# Patient Record
Sex: Female | Born: 1940 | Race: White | Hispanic: No | Smoking: Former smoker
Health system: Southern US, Community
[De-identification: ages and names within clinical notes are randomized; demographics above are authoritative.]

## PROBLEM LIST (undated history)

## (undated) DIAGNOSIS — M1990 Unspecified osteoarthritis, unspecified site: Secondary | ICD-10-CM

## (undated) DIAGNOSIS — Z889 Allergy status to unspecified drugs, medicaments and biological substances status: Secondary | ICD-10-CM

## (undated) DIAGNOSIS — E039 Hypothyroidism, unspecified: Secondary | ICD-10-CM

## (undated) DIAGNOSIS — R51 Headache: Secondary | ICD-10-CM

## (undated) DIAGNOSIS — A310 Pulmonary mycobacterial infection: Secondary | ICD-10-CM

## (undated) DIAGNOSIS — R0600 Dyspnea, unspecified: Secondary | ICD-10-CM

## (undated) DIAGNOSIS — D649 Anemia, unspecified: Secondary | ICD-10-CM

## (undated) DIAGNOSIS — Z972 Presence of dental prosthetic device (complete) (partial): Secondary | ICD-10-CM

## (undated) DIAGNOSIS — J189 Pneumonia, unspecified organism: Secondary | ICD-10-CM

## (undated) HISTORY — DX: Pulmonary mycobacterial infection: A31.0

## (undated) HISTORY — PX: TONSILLECTOMY: SUR1361

## (undated) HISTORY — PX: ABDOMINAL HYSTERECTOMY: SHX81

## (undated) HISTORY — PX: CATARACT EXTRACTION, BILATERAL: SHX1313

## (undated) HISTORY — DX: Hypothyroidism, unspecified: E03.9

## (undated) HISTORY — DX: Allergy status to unspecified drugs, medicaments and biological substances: Z88.9

---

## 2003-10-24 ENCOUNTER — Encounter: Admission: RE | Admit: 2003-10-24 | Discharge: 2003-10-24 | Payer: Self-pay | Admitting: Pediatrics

## 2004-01-08 ENCOUNTER — Ambulatory Visit: Admission: RE | Admit: 2004-01-08 | Discharge: 2004-01-08 | Payer: Self-pay | Admitting: Critical Care Medicine

## 2004-01-08 ENCOUNTER — Encounter (INDEPENDENT_AMBULATORY_CARE_PROVIDER_SITE_OTHER): Payer: Self-pay | Admitting: *Deleted

## 2004-01-30 ENCOUNTER — Ambulatory Visit (HOSPITAL_COMMUNITY): Admission: RE | Admit: 2004-01-30 | Discharge: 2004-01-30 | Payer: Self-pay | Admitting: Gastroenterology

## 2004-06-23 ENCOUNTER — Ambulatory Visit: Payer: Self-pay | Admitting: Critical Care Medicine

## 2004-06-24 ENCOUNTER — Ambulatory Visit: Payer: Self-pay | Admitting: Hematology & Oncology

## 2004-07-29 ENCOUNTER — Ambulatory Visit: Payer: Self-pay | Admitting: Critical Care Medicine

## 2004-07-29 ENCOUNTER — Inpatient Hospital Stay (HOSPITAL_COMMUNITY): Admission: EM | Admit: 2004-07-29 | Discharge: 2004-08-05 | Payer: Self-pay | Admitting: Emergency Medicine

## 2004-08-03 ENCOUNTER — Ambulatory Visit: Payer: Self-pay | Admitting: Hematology & Oncology

## 2004-08-08 ENCOUNTER — Ambulatory Visit: Payer: Self-pay | Admitting: Critical Care Medicine

## 2004-08-21 ENCOUNTER — Ambulatory Visit: Payer: Self-pay | Admitting: Critical Care Medicine

## 2004-08-22 ENCOUNTER — Ambulatory Visit: Payer: Self-pay | Admitting: Endocrinology

## 2004-08-22 ENCOUNTER — Encounter: Admission: RE | Admit: 2004-08-22 | Discharge: 2004-08-22 | Payer: Self-pay | Admitting: Critical Care Medicine

## 2004-08-27 ENCOUNTER — Ambulatory Visit: Payer: Self-pay | Admitting: Endocrinology

## 2004-09-16 ENCOUNTER — Ambulatory Visit: Payer: Self-pay | Admitting: Critical Care Medicine

## 2004-09-17 ENCOUNTER — Ambulatory Visit: Payer: Self-pay | Admitting: Endocrinology

## 2004-09-29 ENCOUNTER — Ambulatory Visit: Payer: Self-pay | Admitting: Critical Care Medicine

## 2004-11-25 ENCOUNTER — Ambulatory Visit: Payer: Self-pay | Admitting: Critical Care Medicine

## 2004-11-25 ENCOUNTER — Ambulatory Visit (HOSPITAL_COMMUNITY): Admission: RE | Admit: 2004-11-25 | Discharge: 2004-11-25 | Payer: Self-pay | Admitting: Critical Care Medicine

## 2005-01-13 ENCOUNTER — Ambulatory Visit: Payer: Self-pay | Admitting: Critical Care Medicine

## 2005-03-19 ENCOUNTER — Ambulatory Visit: Payer: Self-pay | Admitting: Critical Care Medicine

## 2005-04-23 ENCOUNTER — Ambulatory Visit: Payer: Self-pay | Admitting: Critical Care Medicine

## 2005-05-12 ENCOUNTER — Ambulatory Visit: Payer: Self-pay | Admitting: Critical Care Medicine

## 2005-07-01 ENCOUNTER — Ambulatory Visit: Payer: Self-pay | Admitting: Critical Care Medicine

## 2005-08-05 ENCOUNTER — Ambulatory Visit: Payer: Self-pay | Admitting: Critical Care Medicine

## 2005-10-08 ENCOUNTER — Ambulatory Visit: Payer: Self-pay | Admitting: Critical Care Medicine

## 2005-12-16 ENCOUNTER — Ambulatory Visit: Payer: Self-pay | Admitting: Critical Care Medicine

## 2006-02-16 ENCOUNTER — Ambulatory Visit: Payer: Self-pay | Admitting: Psychology

## 2006-03-05 ENCOUNTER — Ambulatory Visit: Payer: Self-pay | Admitting: Psychology

## 2006-04-10 ENCOUNTER — Emergency Department (HOSPITAL_COMMUNITY): Admission: EM | Admit: 2006-04-10 | Discharge: 2006-04-10 | Payer: Self-pay | Admitting: Family Medicine

## 2006-05-19 ENCOUNTER — Ambulatory Visit: Payer: Self-pay | Admitting: Internal Medicine

## 2006-05-20 ENCOUNTER — Ambulatory Visit: Payer: Self-pay | Admitting: Cardiology

## 2006-05-24 ENCOUNTER — Ambulatory Visit: Payer: Self-pay | Admitting: Internal Medicine

## 2007-04-20 ENCOUNTER — Ambulatory Visit: Payer: Self-pay | Admitting: Critical Care Medicine

## 2007-04-26 ENCOUNTER — Telehealth (INDEPENDENT_AMBULATORY_CARE_PROVIDER_SITE_OTHER): Payer: Self-pay | Admitting: *Deleted

## 2007-08-19 DIAGNOSIS — Z9109 Other allergy status, other than to drugs and biological substances: Secondary | ICD-10-CM | POA: Insufficient documentation

## 2007-08-19 DIAGNOSIS — J479 Bronchiectasis, uncomplicated: Secondary | ICD-10-CM

## 2007-08-19 DIAGNOSIS — E039 Hypothyroidism, unspecified: Secondary | ICD-10-CM | POA: Insufficient documentation

## 2008-02-20 ENCOUNTER — Telehealth: Payer: Self-pay | Admitting: Critical Care Medicine

## 2008-02-20 ENCOUNTER — Ambulatory Visit: Payer: Self-pay | Admitting: Critical Care Medicine

## 2008-02-20 ENCOUNTER — Telehealth (INDEPENDENT_AMBULATORY_CARE_PROVIDER_SITE_OTHER): Payer: Self-pay | Admitting: *Deleted

## 2008-02-21 ENCOUNTER — Encounter: Payer: Self-pay | Admitting: Critical Care Medicine

## 2008-02-22 ENCOUNTER — Telehealth: Payer: Self-pay | Admitting: Critical Care Medicine

## 2008-02-22 ENCOUNTER — Encounter: Payer: Self-pay | Admitting: Critical Care Medicine

## 2008-03-01 ENCOUNTER — Telehealth: Payer: Self-pay | Admitting: Critical Care Medicine

## 2008-03-08 ENCOUNTER — Telehealth (INDEPENDENT_AMBULATORY_CARE_PROVIDER_SITE_OTHER): Payer: Self-pay | Admitting: *Deleted

## 2008-03-14 ENCOUNTER — Telehealth (INDEPENDENT_AMBULATORY_CARE_PROVIDER_SITE_OTHER): Payer: Self-pay | Admitting: *Deleted

## 2008-03-15 ENCOUNTER — Telehealth (INDEPENDENT_AMBULATORY_CARE_PROVIDER_SITE_OTHER): Payer: Self-pay | Admitting: *Deleted

## 2008-03-16 ENCOUNTER — Ambulatory Visit: Payer: Self-pay | Admitting: Internal Medicine

## 2008-03-16 ENCOUNTER — Telehealth (INDEPENDENT_AMBULATORY_CARE_PROVIDER_SITE_OTHER): Payer: Self-pay | Admitting: *Deleted

## 2008-03-20 ENCOUNTER — Telehealth (INDEPENDENT_AMBULATORY_CARE_PROVIDER_SITE_OTHER): Payer: Self-pay | Admitting: *Deleted

## 2008-03-27 ENCOUNTER — Telehealth: Payer: Self-pay | Admitting: Critical Care Medicine

## 2008-09-24 ENCOUNTER — Ambulatory Visit: Payer: Self-pay | Admitting: Critical Care Medicine

## 2008-10-01 ENCOUNTER — Encounter: Payer: Self-pay | Admitting: Critical Care Medicine

## 2008-10-01 ENCOUNTER — Telehealth: Payer: Self-pay | Admitting: Critical Care Medicine

## 2008-10-03 ENCOUNTER — Telehealth: Payer: Self-pay | Admitting: Critical Care Medicine

## 2008-10-04 ENCOUNTER — Inpatient Hospital Stay (HOSPITAL_COMMUNITY): Admission: AD | Admit: 2008-10-04 | Discharge: 2008-10-12 | Payer: Self-pay | Admitting: Pulmonary Disease

## 2008-10-04 ENCOUNTER — Ambulatory Visit: Payer: Self-pay | Admitting: Internal Medicine

## 2008-10-04 ENCOUNTER — Encounter: Payer: Self-pay | Admitting: Critical Care Medicine

## 2008-10-04 ENCOUNTER — Telehealth: Payer: Self-pay | Admitting: Critical Care Medicine

## 2008-10-10 ENCOUNTER — Telehealth (INDEPENDENT_AMBULATORY_CARE_PROVIDER_SITE_OTHER): Payer: Self-pay | Admitting: *Deleted

## 2008-10-15 ENCOUNTER — Encounter: Payer: Self-pay | Admitting: Critical Care Medicine

## 2008-10-16 ENCOUNTER — Telehealth (INDEPENDENT_AMBULATORY_CARE_PROVIDER_SITE_OTHER): Payer: Self-pay | Admitting: *Deleted

## 2008-10-17 ENCOUNTER — Telehealth: Payer: Self-pay | Admitting: Critical Care Medicine

## 2008-10-19 ENCOUNTER — Telehealth (INDEPENDENT_AMBULATORY_CARE_PROVIDER_SITE_OTHER): Payer: Self-pay | Admitting: *Deleted

## 2008-10-30 ENCOUNTER — Ambulatory Visit: Payer: Self-pay | Admitting: Critical Care Medicine

## 2008-10-30 DIAGNOSIS — A310 Pulmonary mycobacterial infection: Secondary | ICD-10-CM

## 2008-10-31 ENCOUNTER — Encounter: Payer: Self-pay | Admitting: Critical Care Medicine

## 2008-11-01 ENCOUNTER — Encounter: Payer: Self-pay | Admitting: Critical Care Medicine

## 2008-11-01 ENCOUNTER — Telehealth: Payer: Self-pay | Admitting: Critical Care Medicine

## 2008-11-01 LAB — CONVERTED CEMR LAB
Basophils Relative: 0.8 % (ref 0.0–3.0)
Chloride: 103 meq/L (ref 96–112)
Eosinophils Absolute: 0.9 10*3/uL — ABNORMAL HIGH (ref 0.0–0.7)
Glucose, Bld: 85 mg/dL (ref 70–99)
Hemoglobin: 13.5 g/dL (ref 12.0–15.0)
IgA: 390 mg/dL — ABNORMAL HIGH (ref 68–378)
IgM, Serum: 869 mg/dL — ABNORMAL HIGH (ref 60–263)
Monocytes Absolute: 0.5 10*3/uL (ref 0.1–1.0)
Monocytes Relative: 6.2 % (ref 3.0–12.0)
Neutro Abs: 3.6 10*3/uL (ref 1.4–7.7)
Neutrophils Relative %: 49.9 % (ref 43.0–77.0)
Platelets: 367 10*3/uL (ref 150.0–400.0)
Potassium: 4.5 meq/L (ref 3.5–5.1)
RBC: 4.33 M/uL (ref 3.87–5.11)
Sodium: 139 meq/L (ref 135–145)
TSH: 3.35 microintl units/mL (ref 0.35–5.50)
WBC: 7.4 10*3/uL (ref 4.5–10.5)

## 2008-11-13 ENCOUNTER — Encounter: Payer: Self-pay | Admitting: Critical Care Medicine

## 2008-11-14 ENCOUNTER — Telehealth: Payer: Self-pay | Admitting: Critical Care Medicine

## 2008-11-19 ENCOUNTER — Telehealth: Payer: Self-pay | Admitting: Critical Care Medicine

## 2008-11-19 ENCOUNTER — Encounter: Payer: Self-pay | Admitting: Critical Care Medicine

## 2008-11-21 ENCOUNTER — Telehealth: Payer: Self-pay | Admitting: Critical Care Medicine

## 2008-11-28 ENCOUNTER — Ambulatory Visit: Payer: Self-pay | Admitting: Critical Care Medicine

## 2008-11-30 ENCOUNTER — Telehealth: Payer: Self-pay | Admitting: Critical Care Medicine

## 2008-12-07 ENCOUNTER — Telehealth (INDEPENDENT_AMBULATORY_CARE_PROVIDER_SITE_OTHER): Payer: Self-pay | Admitting: *Deleted

## 2008-12-11 ENCOUNTER — Telehealth (INDEPENDENT_AMBULATORY_CARE_PROVIDER_SITE_OTHER): Payer: Self-pay | Admitting: *Deleted

## 2008-12-28 ENCOUNTER — Telehealth: Payer: Self-pay | Admitting: Critical Care Medicine

## 2008-12-31 ENCOUNTER — Ambulatory Visit: Payer: Self-pay | Admitting: Critical Care Medicine

## 2009-01-01 ENCOUNTER — Encounter: Payer: Self-pay | Admitting: Critical Care Medicine

## 2009-01-08 ENCOUNTER — Encounter: Payer: Self-pay | Admitting: Critical Care Medicine

## 2009-01-21 ENCOUNTER — Encounter: Payer: Self-pay | Admitting: Critical Care Medicine

## 2009-03-04 ENCOUNTER — Ambulatory Visit: Payer: Self-pay | Admitting: Critical Care Medicine

## 2009-03-05 ENCOUNTER — Telehealth: Payer: Self-pay | Admitting: Critical Care Medicine

## 2009-03-12 ENCOUNTER — Encounter: Payer: Self-pay | Admitting: Critical Care Medicine

## 2009-03-20 ENCOUNTER — Encounter: Payer: Self-pay | Admitting: Critical Care Medicine

## 2009-05-13 ENCOUNTER — Ambulatory Visit: Payer: Self-pay | Admitting: Critical Care Medicine

## 2009-05-15 ENCOUNTER — Encounter: Payer: Self-pay | Admitting: Critical Care Medicine

## 2009-05-17 ENCOUNTER — Telehealth (INDEPENDENT_AMBULATORY_CARE_PROVIDER_SITE_OTHER): Payer: Self-pay | Admitting: *Deleted

## 2009-05-20 ENCOUNTER — Ambulatory Visit: Payer: Self-pay | Admitting: Critical Care Medicine

## 2009-05-20 ENCOUNTER — Telehealth (INDEPENDENT_AMBULATORY_CARE_PROVIDER_SITE_OTHER): Payer: Self-pay | Admitting: *Deleted

## 2009-05-27 ENCOUNTER — Telehealth: Payer: Self-pay | Admitting: Critical Care Medicine

## 2009-07-18 ENCOUNTER — Telehealth (INDEPENDENT_AMBULATORY_CARE_PROVIDER_SITE_OTHER): Payer: Self-pay | Admitting: *Deleted

## 2009-10-28 ENCOUNTER — Ambulatory Visit: Payer: Self-pay | Admitting: Critical Care Medicine

## 2009-11-10 ENCOUNTER — Emergency Department (HOSPITAL_COMMUNITY): Admission: EM | Admit: 2009-11-10 | Discharge: 2009-11-10 | Payer: Self-pay | Admitting: Family Medicine

## 2009-11-13 ENCOUNTER — Telehealth: Payer: Self-pay | Admitting: Pulmonary Disease

## 2010-01-16 ENCOUNTER — Encounter: Payer: Self-pay | Admitting: Critical Care Medicine

## 2010-01-17 ENCOUNTER — Encounter: Payer: Self-pay | Admitting: Critical Care Medicine

## 2010-03-11 ENCOUNTER — Ambulatory Visit: Payer: Self-pay | Admitting: Critical Care Medicine

## 2010-03-12 ENCOUNTER — Telehealth: Payer: Self-pay | Admitting: Critical Care Medicine

## 2010-03-12 LAB — CONVERTED CEMR LAB
ALT: 13 units/L (ref 0–35)
AST: 26 units/L (ref 0–37)
Albumin: 3.3 g/dL — ABNORMAL LOW (ref 3.5–5.2)
Alkaline Phosphatase: 74 units/L (ref 39–117)
Basophils Absolute: 0.1 10*3/uL (ref 0.0–0.1)
CO2: 36 meq/L — ABNORMAL HIGH (ref 19–32)
Calcium: 9.2 mg/dL (ref 8.4–10.5)
Chloride: 97 meq/L (ref 96–112)
Creatinine, Ser: 1.1 mg/dL (ref 0.4–1.2)
GFR calc non Af Amer: 51.4 mL/min (ref >60)
Hemoglobin: 12.6 g/dL (ref 12.0–15.0)
Lymphs Abs: 2.6 10*3/uL (ref 0.7–4.0)
MCHC: 34.2 g/dL (ref 30.0–36.0)
Monocytes Absolute: 0.7 10*3/uL (ref 0.1–1.0)
Monocytes Relative: 6.8 % (ref 3.0–12.0)
Neutro Abs: 6.2 10*3/uL (ref 1.4–7.7)
Neutrophils Relative %: 59.1 % (ref 43.0–77.0)
Potassium: 3.7 meq/L (ref 3.5–5.1)
Total Bilirubin: 0.2 mg/dL — ABNORMAL LOW (ref 0.3–1.2)
WBC: 10.5 10*3/uL (ref 4.5–10.5)

## 2010-03-14 ENCOUNTER — Telehealth: Payer: Self-pay | Admitting: Critical Care Medicine

## 2010-04-09 ENCOUNTER — Ambulatory Visit: Payer: Self-pay | Admitting: Critical Care Medicine

## 2010-06-29 ENCOUNTER — Encounter: Payer: Self-pay | Admitting: Allergy and Immunology

## 2010-07-10 NOTE — Miscellaneous (Signed)
Summary: Hydromet Rx  Clinical Lists Changes  Medications: Added new medication of HYDROMET 5-1.5 MG/5ML SYRP (HYDROCODONE-HOMATROPINE) take one teasopponful by mouth every 4 to 6 hour as needed cough - Signed Rx of HYDROMET 5-1.5 MG/5ML SYRP (HYDROCODONE-HOMATROPINE) take one teasopponful by mouth every 4 to 6 hour as needed cough;  #174mL x 7;  Signed;  Entered by: Gweneth Dimitri RN;  Authorized by: Storm Frisk MD;  Method used: Historical    Prescriptions: HYDROMET 5-1.5 MG/5ML SYRP (HYDROCODONE-HOMATROPINE) take one teasopponful by mouth every 4 to 6 hour as needed cough  #174mL x 7   Entered by:   Gweneth Dimitri RN   Authorized by:   Storm Frisk MD   Signed by:   Gweneth Dimitri RN on 01/17/2010   Method used:   Historical   RxID:   [id number]  Received refill request from [facility].  Request authorized by PW as stated above and faxed back to [facility] at [phone] Gweneth Dimitri RN  January 17, 2010 5:05 PM

## 2010-07-10 NOTE — Progress Notes (Signed)
Summary: talk to nurse  Phone Note Call from Patient Call back at Home Phone [phone]   Caller: Patient Call For: wright Reason for Call: Talk to Nurse Summary of Call: Saw PW on 5/23, was given samples of nasonex, noticed big red nodules on her tongue today, wants to know if this is a side effect of this med, pls advise. Initial call taken by: Darletta Moll,  November 13, 2009 4:21 PM  Follow-up for Phone Call        PW patient--Pt states she was given sample of Nasonex at last OV with PW and she finsihed sample and now has a sore thorat and redblisters onthe back of her tongue as well as her throat. She sttets she mentioned this to her pharmacists and they told her that nasonex can cause thrush, so pt thinks this may be what she has. She states no other change in meds.  Please advise. Carron Curie CMA  November 13, 2009 4:39 PM [facility]  Additional Follow-up for Phone Call Additional follow up Details #1::        probably is thrush.. needs nystatin suspension 400,000 International Units swish and swallow three times a day for 5 days.  QS, no fills. Additional Follow-up by: Barbaraann Share MD,  November 13, 2009 5:31 PM    Additional Follow-up for Phone Call Additional follow up Details #2::    -pt advised rx sent.Carron Curie CMA  November 13, 2009 5:36 PM   New/Updated Medications: * NYSTATIN 400,000 IU swish and swallow three times a day x 5 days Prescriptions: NYSTATIN 400,000 IU swish and swallow three times a day x 5 days  #1qs x 0   Entered by:   Carron Curie CMA   Authorized by:   Barbaraann Share MD   Signed by:   Carron Curie CMA on 11/13/2009   Method used:   Telephoned to ...       [facility] [phone]* (retail)       [address]       [place], [place]  [address]       Ph: [phone] or [phone]       Fax: [phone]   RxID:   [id number]

## 2010-07-10 NOTE — Progress Notes (Signed)
Summary: ? pna/fever  Phone Note Call from Patient   Caller: Patient Call For: Arietta Eisenstein Summary of Call: pt think she may have pnuemonia . she want to no if  the biaxin will be enough to take care of the problem. [facility] [phone] Initial call taken by: Rickard Patience,  March 14, 2010 4:58 PM  Follow-up for Phone Call        called and spoke with pt.  pt states she was just seen by PW on 03/11/2010 and given rx for Biaxin.  Pt states she took her temp today and it was 100.5. Pt worried she may now have pna.  Pt worried if the Biaxin will take care of her "infection."  I instructed to take the Biaxin as [name] prescribed and encouraged rest and fluids and to call the office or go to [facility] or ER if symptoms do not improve or worse.  Will forward message to PW as an FYI.  Arman Filter LPN  March 14, 2010 5:37 PM   Additional Follow-up for Phone Call Additional follow up Details #1::        noted and i agree Additional Follow-up by: Storm Frisk MD,  March 14, 2010 6:13 PM

## 2010-07-10 NOTE — Assessment & Plan Note (Signed)
Summary: Pulmonary OV   Primary Provider/Referring Provider:  none  CC:  Follow up.  Pt states Brooke [name] is having "a lot of SOB" x 1 month.  Coughing " all the time" - prod with yellow mucus.  .  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  Brooke [name] is now off anti-tuberculous therapy including off the Zithromax, ethambutol, and Rifabutin since 10/2005.    Oct 28, 2009 10:55 AM The pts cough has been bad this spring.   There is a ? of a head cold.  More sinus issues.  Notes more mucous.  The mucous is dark green. Notes more wheeze.  Has chronic pain in chest area.  If coughs will catch.  Has a croupy cough and throat feels like ripped.  The mucous is looser. Pt denies any significant sore throat, nasal congestion or excess secretions, fever, chills, sweats, unintended weight loss, pleurtic or exertional chest pain, orthopnea PND, or leg swelling Pt denies any increase in rescue therapy over baseline, denies waking up needing it or having any early am or nocturnal exacerbations of coughing/wheezing/or dyspnea.  March 11, 2010 2:40 PM one month of more coughing and more dyspnea.  Now is more hoarse.  Nose is draining.  Ill for a week and having emesis.  No heartburn.  Ears are stopped up.  Notes some chest pain if take a deep breath. Mucus is yellow.  Pain is same pain always has.    Preventive Screening-Counseling & Management  Alcohol-Tobacco     Smoking Status: quit > 6 months  Current Medications (verified): 1)  Alprazolam 0.5 Mg Tabs (Alprazolam) .... Take 1 Tablet By Mouth Three Times A Day As Needed 2)  Celexa 10 Mg Tabs (Citalopram Hydrobromide) .... 1/2  By Mouth Daily 3)  Cenestin 0.625 Mg  Tabs (Estrogens Conj Synthetic A) .... Once Daily 4)  Synthroid 100 Mcg Tabs  (Levothyroxine Sodium) .... One Half Daily 5)  Maxzide 75-50 Mg  Tabs (Triamterene-Hctz) .... One Half Once A  Daily If Needed For Swelling 6)  Hydrocodone-Acetaminophen 10-325 Mg Tabs (Hydrocodone-Acetaminophen) .... Every 4 Hours As Needed Pain 7)  Hydromet 5-1.5 Mg/52ml Syrp (Hydrocodone-Homatropine) .... Take One Teasopponful By Mouth Every 4 To 6 Hour As Needed Cough  Allergies (verified): 1)  ! Pcn 2)  ! Sulfa 3)  ! Levaquin 4)  ! * Nasonex  Past History:  Past medical, surgical, family and social histories (including risk factors) reviewed, and no changes noted (except as noted below).  Past Medical History: Reviewed history from 11/28/2008 and no changes required.  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 neg resp dz/atopy  Social History: Reviewed history from 03/16/2008 and no changes required. former smoker.  < 1 ppd x 3 yrs.  Quit at age 27.  Review of Systems       The patient complains of shortness of breath with activity, shortness of breath at rest, productive cough, non-productive cough, chest pain, and change in color of mucus.  The patient denies coughing up blood, irregular heartbeats, acid heartburn, indigestion, loss of appetite, weight change, abdominal pain, difficulty swallowing, sore throat,  tooth/dental problems, headaches, nasal congestion/difficulty breathing through nose, sneezing, itching, ear ache, anxiety, depression, hand/feet swelling, joint stiffness or pain, rash, and fever.    Vital Signs:  Patient profile:   70 year old female Height:      65 inches Weight:      113.13 pounds BMI:     18.89 O2 Sat:      97 % on Room air Temp:     98.0 degrees F oral Pulse rate:   73 / minute BP sitting:   98 / 56   (left arm) Cuff size:   regular  Vitals Entered By: Gweneth Dimitri RN (March 11, 2010 2:28 PM)  O2 Flow:  Room air CC: Follow up.  Pt states Brooke [name] is having "a lot of SOB" x 1 month.  Coughing " all the time" - prod with yellow mucus.   Comments Medications reviewed with patient Daytime contact number verified with patient. Gweneth Dimitri RN  March 11, 2010 2:28 PM    Physical Exam  Additional Exam:  Gen: cachectic WF , in no distress , depressed affect ENT: no lesions, no post nasal drip Neck: No JVD, no TMG, no carotid bruits Lungs: No use of accessory muscles, no dullness to percussion, scattered rhonchi Cardiovascular: RRR, heart sounds normal, no murmurs or gallops, no peripheral edema Abdomen: soft and non-tender, no HSM, BS normal Skin: mild redness at picc line site Musculoskeletal: No deformities, no cyanosis or clubbing Neuro: alert, non-focal     Impression & Recommendations:  Problem # 1:  BRONCHIECTASIS (ICD-494.0) Assessment Deteriorated  Bronchiectasis with MRSA colonization, mild flare and early sinusitis  plan: trial astepro d/c nasal steroid due to side effects 7days generic biaxin 500mg  two times a day  Medications Added to Medication List This Visit: 1)  Hydrocodone-acetaminophen 10-325 Mg Tabs (Hydrocodone-acetaminophen) .... Every 4 hours as needed pain 2)  Ventolin Hfa 108 (90 Base) Mcg/act Aers (Albuterol sulfate) .Marland Kitchen.. 1-2 puffs every 4-6 hours as needed 3)  Biaxin Xl 500 Mg Tb24 (Clarithromycin) .Marland Kitchen.. 1000 mg (2 tablets) daily 4)  Astepro 0.15 % Soln (Azelastine hcl) .... Two sprays each nostril daily 5)  Promethazine Hcl 25 Mg Tabs (Promethazine hcl) .... One by mouth  every 4 hours as needed for nausea  Complete Medication List: 1)  Alprazolam 0.5 Mg Tabs (Alprazolam) .... Take 1 tablet by mouth three times a day as needed 2)  Celexa 10 Mg Tabs (Citalopram hydrobromide) .... 1/2  by mouth daily 3)  Cenestin 0.625 Mg Tabs (Estrogens conj  synthetic a) .... Once daily 4)  Synthroid 100 Mcg Tabs (Levothyroxine sodium) .... One half daily 5)  Maxzide 75-50 Mg Tabs (Triamterene-hctz) .... One half once a  daily if needed for swelling 6)  Hydrocodone-acetaminophen 10-325 Mg Tabs (Hydrocodone-acetaminophen) .... Every 4 hours as needed pain 7)  Hydromet 5-1.5 Mg/67ml Syrp (Hydrocodone-homatropine) .... Take one teasopponful by mouth every 4 to 6 hour as needed cough 8)  Ventolin Hfa 108 (90 Base) Mcg/act Aers (Albuterol sulfate) .Marland Kitchen.. 1-2 puffs every 4-6 hours as needed 9)  Biaxin Xl 500 Mg Tb24 (Clarithromycin) .Marland Kitchen.. 1000 mg (2 tablets) daily 10)  Astepro 0.15 % Soln (Azelastine hcl) .... Two sprays each nostril daily 11)  Promethazine Hcl 25 Mg Tabs (Promethazine hcl) .... One by mouth  every 4 hours as needed for nausea  Other Orders: Est. Patient Level IV (30160) TLB-CBC Platelet - w/Differential (85025-CBCD) TLB-BMP (Basic Metabolic Panel-BMET) (80048-METABOL) TLB-Hepatic/Liver Function Pnl (80076-HEPATIC)  Patient Instructions: 1)  Biaxin XL two daily for 7days  2)  Refills on cough syrup and pain med given 3)  Inhaler refill given 4)  Trial Astepro two sprays each nostril daily 5)  Return 2 months Prescriptions: HYDROCODONE-ACETAMINOPHEN 10-325 MG TABS (HYDROCODONE-ACETAMINOPHEN) every 4 hours as needed pain  #60 x 4   Entered and Authorized by:   Storm Frisk MD   Signed by:   Storm Frisk MD on 03/11/2010   Method used:   Print then Give to Patient   RxID:   [id number] PROMETHAZINE HCL 25 MG TABS (PROMETHAZINE HCL) One by mouth  every 4 hours as needed for nausea  #20 x 0   Entered and Authorized by:   Storm Frisk MD   Signed by:   Storm Frisk MD on 03/11/2010   Method used:   Print then Give to Patient   RxID:   [id number] HYDROMET 5-1.5 MG/5ML SYRP (HYDROCODONE-HOMATROPINE) take one teasopponful by mouth every 4 to 6 hour as needed cough  #240 ML x 1   Entered and Authorized by:    Storm Frisk MD   Signed by:   Storm Frisk MD on 03/11/2010   Method used:   Print then Give to Patient   RxID:   [id number] HYDROCODONE-ACETAMINOPHEN 10-325 MG TABS (HYDROCODONE-ACETAMINOPHEN) every 4 hours as needed pain  #60 x 0   Entered and Authorized by:   Storm Frisk MD   Signed by:   Storm Frisk MD on 03/11/2010   Method used:   Print then Give to Patient   RxID:   [id number] BIAXIN XL 500 MG  TB24 (CLARITHROMYCIN) 1000 mg (2 tablets) daily  #14 x 0   Entered and Authorized by:   Storm Frisk MD   Signed by:   Storm Frisk MD on 03/11/2010   Method used:   Print then Give to Patient   RxID:   [id number] VENTOLIN HFA 108 (90 BASE) MCG/ACT  AERS (ALBUTEROL SULFATE) 1-2 puffs every 4-6 hours as needed  #1 x 6   Entered and Authorized by:   Storm Frisk MD   Signed by:   Storm Frisk MD on 03/11/2010   Method used:   Print then Give to Patient   RxID:   [id number]

## 2010-07-10 NOTE — Progress Notes (Signed)
Summary: Lab results  Phone Note Outgoing Call   Reason for Call: Discuss lab or test results Summary of Call: call pt and tell her all labs are ok Initial call taken by: Storm Frisk MD,  March 12, 2010 10:00 AM  Follow-up for Phone Call        Called, spoke with pt. She was informed per PW labs ok.  She verbalized understanding.  Follow-up by: Gweneth Dimitri RN,  March 12, 2010 1:40 PM

## 2010-07-10 NOTE — Assessment & Plan Note (Signed)
Summary: Pulmonary OV   Primary Silvano Garofano/Referring Million Maharaj:  none  CC:  Acute Visit.  "lots of" chest congestion, cough - prod at times with dark green mucus, chest tightness, and increased SOB.  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  She is now off anti-tuberculous therapy including off the Zithromax, ethambutol, and Rifabutin since 10/2005.    Oct 28, 2009 10:55 AM The pts cough has been bad this spring.   There is a ? of a head cold.  More sinus issues.  Notes more mucous.  The mucous is dark green. Notes more wheeze.  Has chronic pain in chest area.  If coughs will catch.  Has a croupy cough and throat feels like ripped.  The mucous is looser. Pt denies any significant sore throat, nasal congestion or excess secretions, fever, chills, sweats, unintended weight loss, pleurtic or exertional chest pain, orthopnea PND, or leg swelling Pt denies any increase in rescue therapy over baseline, denies waking up needing it or having any early am or nocturnal exacerbations of coughing/wheezing/or dyspnea.  March 11, 2010 2:40 PM one month of more coughing and more dyspnea.  Now is more hoarse.  Nose is draining.  Ill for a week and having emesis.  No heartburn.  Ears are stopped up.  Notes some chest pain if take a deep breath. Mucus is yellow.  Pain is same pain always has.    April 09, 2010 12:23 PM The pt now notes lots of congestion.  The ears are stopped up.  The mucus is green. Symptoms worse over several weeks. There is more dyspnea.  There is no heartburn but there is chest pain if the pt takes a deep breath.   Preventive Screening-Counseling & Management  Alcohol-Tobacco     Smoking Status: quit > 6 months  Current  Medications (verified): 1)  Alprazolam 0.5 Mg Tabs (Alprazolam) .... Take 1 Tablet By Mouth Three Times A Day As Needed 2)  Celexa 10 Mg Tabs (Citalopram Hydrobromide) .... 1/2  By Mouth Daily 3)  Cenestin 0.625 Mg  Tabs (Estrogens Conj Synthetic A) .... Once Daily 4)  Synthroid 100 Mcg Tabs (Levothyroxine Sodium) .... One Half Daily 5)  Maxzide 75-50 Mg  Tabs (Triamterene-Hctz) .... One Half Once A  Daily  For Swelling 6)  Hydrocodone-Acetaminophen 10-325 Mg Tabs (Hydrocodone-Acetaminophen) .... Every 4 Hours As Needed Pain 7)  Hydromet 5-1.5 Mg/71ml Syrp (Hydrocodone-Homatropine) .... Take One Teasopponful By Mouth Every 4 To 6 Hour As Needed Cough 8)  Ventolin Hfa 108 (90 Base) Mcg/act  Aers (Albuterol Sulfate) .Marland Kitchen.. 1-2 Puffs Every 4-6 Hours As Needed  Allergies (verified): 1)  ! Pcn 2)  ! Sulfa 3)  ! Levaquin 4)  ! * Nasonex  Past History:  Past medical, surgical, family and social histories (including risk factors) reviewed, and no changes noted (except as noted below).  Past Medical History: Reviewed history from 11/28/2008 and no changes required.  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 neg resp dz/atopy  Social History: Reviewed history from 03/11/2010 and no changes required. former smoker.  <  1 ppd x 3 yrs.  Quit at age 76.  Review of Systems       The patient complains of shortness of breath with activity, shortness of breath at rest, productive cough, non-productive cough, and change in color of mucus.  The patient denies coughing up blood, chest pain, irregular heartbeats, acid heartburn, indigestion, loss of appetite, weight change, abdominal pain, difficulty swallowing, sore throat, tooth/dental problems, headaches, nasal  congestion/difficulty breathing through nose, sneezing, itching, ear ache, anxiety, depression, hand/feet swelling, joint stiffness or pain, rash, and fever.    Vital Signs:  Patient profile:   70 year old female Height:      65 inches Weight:      113.13 pounds BMI:     18.89 O2 Sat:      94 % on Room air Temp:     98.1 degrees F oral Pulse rate:   96 / minute BP sitting:   118 / 80  (right arm) Cuff size:   regular  Vitals Entered By: Gweneth Dimitri RN (April 09, 2010 12:07 PM)  O2 Flow:  Room air CC: Acute Visit.  "lots of" chest congestion, cough - prod at times with dark green mucus, chest tightness, increased SOB.  Using ventolin more frequently. Comments Medications reviewed with patient Daytime contact number verified with patient. Gweneth Dimitri RN  April 09, 2010 12:10 PM    Physical Exam  Additional Exam:  Gen: cachectic WF , in no distress , depressed affect ENT: no lesions, no post nasal drip Neck: No JVD, no TMG, no carotid bruits Lungs: No use of accessory muscles, no dullness to percussion, scattered rhonchi Cardiovascular: RRR, heart sounds normal, no murmurs or gallops, no peripheral edema Abdomen: soft and non-tender, no HSM, BS normal Skin: mild redness at picc line site Musculoskeletal: No deformities, no cyanosis or clubbing Neuro: alert, non-focal     CXR  Procedure date:  04/09/2010  Findings:      IMPRESSION: 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  Impression & Recommendations:  Problem # 1:  BRONCHIECTASIS (ICD-494.0) Assessment Deteriorated  Bronchiectasis with mild flare and neg CXR today for acute process  plan: 7days biaxin 500mg  two times a day no other med changes  Medications Added to Medication List This Visit: 1)  Maxzide 75-50 Mg Tabs (Triamterene-hctz) .... One half once a  daily  for swelling 2)  Clarithromycin 500 Mg Tabs (Clarithromycin) .... One by mouth two times a  day  Complete Medication List: 1)  Alprazolam 0.5 Mg Tabs (Alprazolam) .... Take 1 tablet by mouth three times a day as needed 2)  Celexa 10 Mg Tabs (Citalopram hydrobromide) .... 1/2  by mouth daily 3)  Cenestin 0.625 Mg Tabs (Estrogens conj synthetic a) .... Once daily 4)  Synthroid 100 Mcg Tabs (Levothyroxine sodium) .... One half daily 5)  Maxzide 75-50 Mg Tabs (Triamterene-hctz) .... One half once a  daily  for swelling 6)  Hydrocodone-acetaminophen 10-325 Mg Tabs (Hydrocodone-acetaminophen) .... Every 4 hours as needed pain 7)  Hydromet 5-1.5 Mg/88ml Syrp (Hydrocodone-homatropine) .... Take one teasopponful by mouth every 4 to 6 hour as needed cough 8)  Ventolin Hfa 108 (90 Base) Mcg/act Aers (Albuterol sulfate) .Marland Kitchen.. 1-2 puffs every 4-6 hours as needed 9)  Clarithromycin 500 Mg Tabs (Clarithromycin) .... One by mouth two times a day  Other Orders: Est. Patient Level IV (99214) T-2 View CXR (71020TC)  Patient Instructions: 1)  Biaxin one twice a day for  7days (generic sent) 2)  No change in other medications 3)  Get your primary care MD to check your thyroid function 4)  Chest xray today, I will call with result 5)  Return 2 months Prescriptions: CLARITHROMYCIN 500 MG TABS (CLARITHROMYCIN) one by mouth two times a day  #14 x 0   Entered and Authorized by:   Storm Frisk MD   Signed by:   Storm Frisk MD on 04/09/2010   Method used:   Print then Give to Patient   RxID:   [id number]

## 2010-07-10 NOTE — Medication Information (Signed)
Summary: Hydromet / [facility]  Hydromet / [facility]   Imported By: Lennie Odor 01/21/2010 14:32:42  _____________________________________________________________________  External Attachment:    Type:   Image     Comment:   External Document

## 2010-07-10 NOTE — Progress Notes (Signed)
Summary: PT'S CONDITION  Phone Note Call from Patient Call back at [phone]   Caller: Daughter VICKY Call For: WRIGHT Summary of Call: HAVE QUESTIONS ABOUT PT'S CONDITION  Initial call taken by: Rickard Patience,  July 18, 2009 3:01 PM  Follow-up for Phone Call        Spoke with pt's daughter.  She states that she would like to speak to Dr Delford Field about pt having been dxed with MRSA.  She is concerned about mother and feels "she is holding something back".  Would like PW to call her at his conveinience.  Aware that he is out of the office until 07/22/09.   Follow-up by: Vernie Murders,  July 18, 2009 3:17 PM  Additional Follow-up for Phone Call Additional follow up Details #1::        Find out what she wants more specifically I will not have time to make this call until next week Additional Follow-up by: Storm Frisk MD,  July 19, 2009 11:09 AM    Additional Follow-up for Phone Call Additional follow up Details #2::    LMTCB Vernie Murders  July 19, 2009 11:13 AM   Additional Follow-up for Phone Call Additional follow up Details #3:: Details for Additional Follow-up Action Taken: Pls see my earlier request and do not send back to my box until the request if fulfilled  Spoke with pt's daughter.  She wants to know what sort of tx pt will receive for her MRSA. Also, there has been an outbreak of MRSA at her son's school and pt's daughter would like to know if she should keep herself and her children away from pt until she is treated.  Please advise thanks.sign  She is colonized with MRSA,  She does not need to be rx now.  We are following her. It would be reasonable to keep small children away from her for now.   Spoke with pt's daughter and made aware of the abover per PW.   pt to keep next OV. Additional Follow-up by: Storm Frisk MD,  July 22, 2009 1:26 PM

## 2010-07-10 NOTE — Assessment & Plan Note (Signed)
Summary: Pulmonary OV   Primary Provider/Referring Provider:  none  CC:  Followup.  Pt states that cough has been worse x 1 wk.  Cough is prod with yellow sputum.  Pt states that she hears "rattling in chest".  She c/o sore throat that she relates to her cough.  .  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  She is now off anti-tuberculous therapy including off the Zithromax, ethambutol, and Rifabutin since 10/2005.    Oct 28, 2009 10:55 AM The pts cough has been bad this spring.   There is a ? of a head cold.  More sinus issues.  Notes more mucous.  The mucous is dark green. Notes more wheeze.  Has chronic pain in chest area.  If coughs will catch.  Has a croupy cough and throat feels like ripped.  The mucous is looser. Pt denies any significant sore throat, nasal congestion or excess secretions, fever, chills, sweats, unintended weight loss, pleurtic or exertional chest pain, orthopnea PND, or leg swelling Pt denies any increase in rescue therapy over baseline, denies waking up needing it or having any early am or nocturnal exacerbations of coughing/wheezing/or dyspnea.   Preventive Screening-Counseling & Management  Alcohol-Tobacco     Smoking Status: quit > 6 months  Current Medications (verified): 1)  Alprazolam 0.5 Mg Tabs (Alprazolam) .... Take 1 Tablet By Mouth Three Times A Day As Needed 2)  Celexa 10 Mg Tabs (Citalopram Hydrobromide) .... 1/2  By Mouth Daily 3)  Cenestin 0.625 Mg  Tabs (Estrogens Conj Synthetic A) .... Once Daily 4)  Synthroid 100 Mcg Tabs (Levothyroxine Sodium) .... One Half Daily 5)  Proventil Hfa 108 (90 Base) Mcg/act Aers (Albuterol Sulfate) .... 2 Puffs Every 6 Hours As Needed 6)  Maxzide 75-50 Mg  Tabs (Triamterene-Hctz) .... One Half Once  A  Daily If Needed For Swelling 7)  Albuterol Sulfate (2.5 Mg/5ml) 0.083%  Nebu (Albuterol Sulfate) .... Four Times Daily or Every 6 Hours As Needed 8)  Hydrocodone-Acetaminophen 10-325 Mg Tabs (Hydrocodone-Acetaminophen) .... Every 4 Hours As Needed Pain  Allergies (verified): 1)  ! Pcn 2)  ! Sulfa 3)  ! Levaquin  Past History:  Past medical, surgical, family and social histories (including risk factors) reviewed, and no changes noted (except as noted below).  Past Medical History: Reviewed history from 11/28/2008 and no changes required.  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 neg resp dz/atopy  Social History: Reviewed history from 03/16/2008 and no changes required. former smoker  Review of Systems       The patient complains of shortness of breath with activity and productive cough.  The patient denies shortness of breath at rest, non-productive cough, coughing up blood, chest pain, irregular heartbeats, acid heartburn, indigestion, loss of appetite, weight change, abdominal pain, difficulty swallowing, sore throat, tooth/dental problems, headaches, nasal congestion/difficulty breathing through nose, sneezing, itching, ear ache, anxiety, depression, hand/feet swelling, joint stiffness or pain, rash, change in color of mucus, and fever.    Vital Signs:  Patient profile:   70 year old female Weight:      114 pounds O2  Sat:      95 % on Room air Temp:     98.2 degrees F oral Pulse rate:   82 / minute BP sitting:   118 / 60  (left arm)  Vitals Entered By: Vernie Murders (Oct 28, 2009 10:47 AM)  O2 Flow:  Room air  Physical Exam  Additional Exam:  Gen: cachectic WF , in no distress , depressed affect ENT: no lesions, no post nasal  drip Neck: No JVD, no TMG, no carotid bruits Lungs: No use of accessory muscles, no dullness to percussion, no rhonchi Cardiovascular: RRR, heart sounds normal, no murmurs or gallops, no peripheral edema Abdomen: soft and non-tender, no HSM, BS normal Skin: mild redness at picc line site Musculoskeletal: No deformities, no cyanosis or clubbing Neuro: alert, non-focal     Impression & Recommendations:  Problem # 1:  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 Every 4 hours as needed pain 2)  Clarithromycin 500 Mg Tabs (Clarithromycin) .... One by mouth two times a day 3)  Nasonex 50 Mcg/act Susp (Mometasone furoate) .... Two puffs each nostril daily  Complete Medication List: 1)  Alprazolam 0.5 Mg Tabs (Alprazolam) .... Take 1 tablet by mouth three times a day as needed 2)  Celexa 10 Mg Tabs (Citalopram hydrobromide) .... 1/2  by mouth daily 3)  Cenestin 0.625 Mg Tabs (Estrogens conj synthetic a) .... Once daily 4)  Synthroid 100 Mcg Tabs (Levothyroxine sodium) .... One half daily 5)  Maxzide 75-50 Mg Tabs (Triamterene-hctz) .... One half once a  daily if needed for swelling 6)  Hydrocodone-acetaminophen 10-325 Mg Tabs (Hydrocodone-acetaminophen) .... Every 4 hours as needed pain 7)  Clarithromycin 500 Mg Tabs (Clarithromycin) .... One by mouth two times a day 8)  Nasonex 50 Mcg/act Susp (Mometasone furoate) .... Two puffs each nostril daily  Other Orders: Est. Patient Level IV (04540) Prescription Created Electronically [phone])  Patient Instructions: 1)   Nasonex two puff  each nostril daily 2)  Hydrocodone for pain 3)  Biaxin (generic) one twice daily for 7 days 4)  Use saline nasal spray two sprays each nostril three times daily 5)  Ventolin sample given (same as proventil) 6)  Return 4 months, sooner if unimproved Prescriptions: NASONEX 50 MCG/ACT  SUSP (MOMETASONE FUROATE) Two puffs each nostril daily  #1 x 6   Entered and Authorized by:   Storm Frisk MD   Signed by:   Storm Frisk MD on 10/28/2009   Method used:   Print then Give to Patient   RxID:   [id number] HYDROCODONE-ACETAMINOPHEN 10-325 MG TABS (HYDROCODONE-ACETAMINOPHEN) every 4 hours as needed pain  #60 x 4   Entered and Authorized by:   Storm Frisk MD   Signed by:   Storm Frisk MD on 10/28/2009   Method used:   Print then Give to Patient   RxID:   [id number] CLARITHROMYCIN 500 MG TABS (CLARITHROMYCIN) one by mouth two times a day  #14 x 0   Entered and Authorized by:   Storm Frisk MD   Signed by:   Storm Frisk MD on 10/28/2009   Method used:  Print then Give to Patient   RxID:   [id number]

## 2010-07-15 ENCOUNTER — Ambulatory Visit: Payer: Self-pay | Admitting: Critical Care Medicine

## 2010-07-23 ENCOUNTER — Encounter: Payer: Self-pay | Admitting: Critical Care Medicine

## 2010-07-23 ENCOUNTER — Ambulatory Visit (INDEPENDENT_AMBULATORY_CARE_PROVIDER_SITE_OTHER): Payer: MEDICARE | Admitting: Critical Care Medicine

## 2010-07-23 DIAGNOSIS — J479 Bronchiectasis, uncomplicated: Secondary | ICD-10-CM

## 2010-07-23 DIAGNOSIS — A310 Pulmonary mycobacterial infection: Secondary | ICD-10-CM

## 2010-07-30 NOTE — Assessment & Plan Note (Signed)
Summary: Pulmonary OV   Primary Provider/Referring Provider:  none  CC:  Follow up.  sinus pressure/congestion x 1 wk.  prod cough with light green to yellowish mucus..  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  She is now off anti-tuberculous therapy including off the Zithromax, ethambutol, and Rifabutin since 10/2005.      April 09, 2010 12:23 PM The pt now notes lots of congestion.  The ears are stopped up.  The mucus is green. Symptoms worse over several weeks. There is more dyspnea.  There is no heartburn but there is chest pain if the pt takes a deep breath.   July 23, 2010 3:34 PM Notes chronic sinus drip,  and more cough that is constant.  Cough is harder and harder to raise mucus and feels congestion.  Pt notes hoarseness and scratchy throat. Noted sl fever one time.   This pt has noted more dry cough.  Her dyspnea is at baseline.  There is no chills or sweats.   Preventive Screening-Counseling & Management  Alcohol-Tobacco     Smoking Status: quit > 6 months  Current Medications (verified): 1)  Alprazolam 0.5 Mg Tabs (Alprazolam) .... Take 1 Tablet By Mouth Three Times A Day As Needed 2)  Celexa 10 Mg Tabs (Citalopram Hydrobromide) .... 1/2  By Mouth Daily 3)  Cenestin 0.625 Mg  Tabs (Estrogens Conj Synthetic A) .... Once Daily 4)  Synthroid 100 Mcg Tabs (Levothyroxine Sodium) .... One Half Daily 5)  Maxzide 75-50 Mg  Tabs (Triamterene-Hctz) .... One Half Once A  Daily  For Swelling 6)  Hydrocodone-Acetaminophen 10-325 Mg Tabs (Hydrocodone-Acetaminophen) .... Every 4 Hours As Needed Pain 7)  Hydromet 5-1.5 Mg/29ml Syrp (Hydrocodone-Homatropine) .... Take One Teasopponful By Mouth Every 4 To 6 Hour As Needed Cough 8)  Ventolin Hfa 108 (90 Base)  Mcg/act  Aers (Albuterol Sulfate) .Marland Kitchen.. 1-2 Puffs Every 4-6 Hours As Needed  Allergies: 1)  ! Sulfa 2)  ! Levaquin 3)  ! * Nasonex  Past History:  Past medical, surgical, family and social histories (including risk factors) reviewed, and no changes noted (except as noted below).  Past Medical History: Reviewed history from 11/28/2008 and no changes required.  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 neg resp dz/atopy  Social History: Reviewed history from 03/11/2010 and no changes required. former smoker.  < 1 ppd x 3 yrs.  Quit at age 46.  Review of Systems       The patient complains of shortness of breath with activity, productive cough, non-productive cough, and change in color of mucus.  The patient denies shortness of breath at rest, coughing up blood, chest pain, irregular heartbeats, acid heartburn, indigestion, loss of appetite, weight change, abdominal pain, difficulty swallowing, sore throat, tooth/dental problems, headaches, nasal congestion/difficulty breathing through nose, sneezing, itching, ear ache, anxiety, depression, hand/feet swelling, joint stiffness or pain, rash, and fever.    Vital Signs:  Patient profile:   70 year old female Height:      65 inches Weight:      112.13 pounds BMI:     18.73 O2 Sat:  96 % on Room air Temp:     98.3 degrees F oral Pulse rate:   79 / minute BP sitting:   90 / 62  (left arm) Cuff size:   regular  Vitals Entered By: Gweneth Dimitri RN (July 23, 2010 3:28 PM)  O2 Flow:  Room air CC: Follow up.  sinus pressure/congestion x 1 wk.  prod cough with light green to yellowish mucus. Comments Medications reviewed with patient Daytime contact number verified with patient. 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 Patient Level IV (16109)  No evidence for active MAC infection at this time  Problem # 2:  BRONCHIECTASIS (ICD-494.0) Assessment: Unchanged  Bronchiectasis with mild flare   plan: 7days biaxin 500mg  two times a day no other med changes  Medications Added to Medication List This Visit: 1)  Hydrocodone-acetaminophen 10-325 Mg Tabs (Hydrocodone-acetaminophen) .... Every 4 hours as needed pain 2)  Ventolin Hfa 108 (90 Base) Mcg/act Aers (Albuterol sulfate) .Marland Kitchen.. 1-2 puffs every 4-6 hours as needed 3)  Clarithromycin 500 Mg Tabs (Clarithromycin) .... One by mouth two times a day  Complete Medication List: 1)  Alprazolam 0.5 Mg Tabs (Alprazolam) .... Take 1 tablet by mouth three times a day as needed 2)  Celexa 10 Mg Tabs (Citalopram hydrobromide) .... 1/2  by mouth daily 3)  Cenestin 0.625 Mg Tabs (Estrogens conj synthetic a) .... Once daily 4)  Synthroid 100 Mcg Tabs (Levothyroxine sodium) .... One half daily 5)  Maxzide 75-50 Mg Tabs (Triamterene-hctz) .... One half once a  daily  for swelling 6)  Hydrocodone-acetaminophen 10-325 Mg Tabs (Hydrocodone-acetaminophen) .... Every 4 hours as needed pain 7)  Ventolin Hfa 108 (90 Base) Mcg/act Aers (Albuterol sulfate) .Marland Kitchen.. 1-2 puffs every 4-6 hours as needed 8)   Clarithromycin 500 Mg Tabs (Clarithromycin) .... One by mouth two times a day  Patient Instructions: 1)  Use simple saline nasal spray two sprays each nostril three times daily 2)  Clarythromycin one twice daily for 7days 3)  Ventolin and hydrocodone refills given 4)  No other medication changes 5)  Return 3 months Prescriptions: CLARITHROMYCIN 500 MG TABS (CLARITHROMYCIN) one by mouth two times a day  #14 x 0   Entered and Authorized by:   Storm Frisk MD   Signed by:   Storm Frisk MD on 07/23/2010   Method used:   Print then Give to Patient   RxID:   [id number] HYDROCODONE-ACETAMINOPHEN 10-325 MG TABS (HYDROCODONE-ACETAMINOPHEN) every 4 hours as needed pain  #90 x 4   Entered and Authorized by:   Storm Frisk MD   Signed by:   Storm Frisk MD on 07/23/2010   Method used:   Print then Give to Patient   RxID:   [id number] VENTOLIN HFA 108 (90 BASE) MCG/ACT  AERS (ALBUTEROL SULFATE) 1-2 puffs every 4-6 hours  as needed  #1 x 6   Entered and Authorized by:   Storm Frisk MD   Signed by:   Storm Frisk MD on 07/23/2010   Method used:   Print then Give to Patient   RxID:   [id number]

## 2010-09-16 LAB — BASIC METABOLIC PANEL
BUN: 5 mg/dL — ABNORMAL LOW (ref 6–23)
BUN: 5 mg/dL — ABNORMAL LOW (ref 6–23)
BUN: 5 mg/dL — ABNORMAL LOW (ref 6–23)
BUN: 6 mg/dL (ref 6–23)
CO2: 27 mEq/L (ref 19–32)
CO2: 28 mEq/L (ref 19–32)
CO2: 28 mEq/L (ref 19–32)
Calcium: 8 mg/dL — ABNORMAL LOW (ref 8.4–10.5)
Calcium: 8.5 mg/dL (ref 8.4–10.5)
Chloride: 101 mEq/L (ref 96–112)
Chloride: 104 mEq/L (ref 96–112)
Chloride: 105 mEq/L (ref 96–112)
Creatinine, Ser: 0.68 mg/dL (ref 0.4–1.2)
Creatinine, Ser: 0.69 mg/dL (ref 0.4–1.2)
Creatinine, Ser: 0.69 mg/dL (ref 0.4–1.2)
GFR calc Af Amer: >60 mL/min (ref >60)
GFR calc Af Amer: >60 mL/min (ref >60)
GFR calc Af Amer: >60 mL/min (ref >60)
GFR calc non Af Amer: >60 mL/min (ref >60)
GFR calc non Af Amer: >60 mL/min (ref >60)
GFR calc non Af Amer: >60 mL/min (ref >60)
Glucose, Bld: 77 mg/dL (ref 70–99)
Potassium: 3.7 mEq/L (ref 3.5–5.1)
Potassium: 3.9 mEq/L (ref 3.5–5.1)
Sodium: 138 mEq/L (ref 135–145)

## 2010-09-16 LAB — CBC
HCT: 32.8 % — ABNORMAL LOW (ref 36.0–46.0)
Hemoglobin: 9.5 g/dL — ABNORMAL LOW (ref 12.0–15.0)
MCHC: 33.4 g/dL (ref 30.0–36.0)
MCHC: 34 g/dL (ref 30.0–36.0)
MCV: 91.9 fL (ref 78.0–100.0)
MCV: 92.4 fL (ref 78.0–100.0)
Platelets: 298 10*3/uL (ref 150–400)
RBC: 3.05 MIL/uL — ABNORMAL LOW (ref 3.87–5.11)
RDW: 13.9 % (ref 11.5–15.5)
RDW: 13.9 % (ref 11.5–15.5)
WBC: 6 10*3/uL (ref 4.0–10.5)

## 2010-09-16 LAB — VANCOMYCIN, TROUGH
Vancomycin Tr: 12.5 ug/mL (ref 10.0–20.0)
Vancomycin Tr: 27.1 ug/mL (ref 10.0–20.0)

## 2010-09-16 LAB — EXPECTORATED SPUTUM ASSESSMENT W GRAM STAIN, RFLX TO RESP C

## 2010-09-16 LAB — CULTURE, RESPIRATORY W GRAM STAIN: Culture: NORMAL

## 2010-09-16 LAB — PHOSPHORUS: Phosphorus: 2.1 mg/dL — ABNORMAL LOW (ref 2.3–4.6)

## 2010-09-17 LAB — BASIC METABOLIC PANEL
BUN: 7 mg/dL (ref 6–23)
CO2: 31 mEq/L (ref 19–32)
Chloride: 92 mEq/L — ABNORMAL LOW (ref 96–112)
Chloride: 93 mEq/L — ABNORMAL LOW (ref 96–112)
GFR calc non Af Amer: 56 mL/min — ABNORMAL LOW (ref >60)
Glucose, Bld: 85 mg/dL (ref 70–99)
Glucose, Bld: 87 mg/dL (ref 70–99)
Potassium: 3.3 mEq/L — ABNORMAL LOW (ref 3.5–5.1)
Potassium: 3.4 mEq/L — ABNORMAL LOW (ref 3.5–5.1)
Sodium: 133 mEq/L — ABNORMAL LOW (ref 135–145)

## 2010-09-17 LAB — CARDIAC PANEL(CRET KIN+CKTOT+MB+TROPI)
CK, MB: 1.4 ng/mL (ref 0.3–4.0)
Troponin I: 0.01 ng/mL (ref 0.00–0.06)

## 2010-09-17 LAB — CBC
HCT: 32 % — ABNORMAL LOW (ref 36.0–46.0)
HCT: 32.8 % — ABNORMAL LOW (ref 36.0–46.0)
Hemoglobin: 10.8 g/dL — ABNORMAL LOW (ref 12.0–15.0)
MCV: 91.9 fL (ref 78.0–100.0)
Platelets: 306 10*3/uL (ref 150–400)
RBC: 3.6 MIL/uL — ABNORMAL LOW (ref 3.87–5.11)
WBC: 5.9 10*3/uL (ref 4.0–10.5)
WBC: 6.5 10*3/uL (ref 4.0–10.5)

## 2010-09-17 LAB — TSH: TSH: 2.769 u[IU]/mL (ref 0.350–4.500)

## 2010-09-22 ENCOUNTER — Telehealth: Payer: Self-pay | Admitting: Critical Care Medicine

## 2010-09-22 NOTE — Telephone Encounter (Signed)
This is ok

## 2010-09-22 NOTE — Telephone Encounter (Signed)
Called spoke with patient, who verified that her PCP retired unexpectedly.  Pt takes alprazolam 0.5mg , 1 tab tid prn.  Pt requests that [facility] refill this for her while she tries to find another PCP.  [name], please advise thanks.  [facility].    Allergies: sulfa.

## 2010-09-23 MED ORDER — ALPRAZOLAM 0.5 MG PO TABS: 0.5000 mg | ORAL_TABLET | Freq: Three times a day (TID) | ORAL | Status: DC | PRN

## 2010-09-23 NOTE — Telephone Encounter (Signed)
Patient notified we will refill her Alprazolam and RX called to [facility].

## 2010-10-13 ENCOUNTER — Telehealth: Payer: Self-pay | Admitting: Critical Care Medicine

## 2010-10-13 NOTE — Telephone Encounter (Signed)
Spoke w/ pt and she states she has bronchiectasis and MRSA. Pt states she has been coughing a lot of MRSA up and it is olive green. Pt states "she know's this is MRSA and know's what it looks like and she is not crazy". Pt states she currently has a sinus infection as well. Pt c/o frontal head pressure, ears feels stopped. Pt is requesting an abx be called in. Pt does not want any cough syrup or pain meds called in. Pt also states in June she is going to have cataract surgery by Dr. Darel Hong and states she is terrified. Pt is requesting to speak to [name] about this. Pt was last seen 07/2010 and told to f/u in 3 months. Pt states she can't come in b/c she can't drive currently and has no way of coming in until after her cataract surgery. Please advise [name]. Thanks  Carver Fila, CMA

## 2010-10-13 NOTE — Telephone Encounter (Signed)
Pt scheduled an apt at 10:30 tomorrow to see PW. Pt states she is going to try to find someone to bring her

## 2010-10-13 NOTE — Telephone Encounter (Signed)
She needs an OV either myself or TP

## 2010-10-14 ENCOUNTER — Other Ambulatory Visit: Payer: MEDICARE

## 2010-10-14 ENCOUNTER — Ambulatory Visit (INDEPENDENT_AMBULATORY_CARE_PROVIDER_SITE_OTHER): Payer: MEDICARE | Admitting: Critical Care Medicine

## 2010-10-14 ENCOUNTER — Ambulatory Visit (INDEPENDENT_AMBULATORY_CARE_PROVIDER_SITE_OTHER)
Admission: RE | Admit: 2010-10-14 | Discharge: 2010-10-14 | Disposition: A | Discharge status: Home / Self Care | Payer: MEDICARE | Source: Ambulatory Visit | Attending: Critical Care Medicine | Admitting: Critical Care Medicine

## 2010-10-14 ENCOUNTER — Encounter: Payer: Self-pay | Admitting: Critical Care Medicine

## 2010-10-14 VITALS — BP 106/58 | HR 68 | Temp 98.1°F | Ht 65.0 in | Wt 112.2 lb

## 2010-10-14 DIAGNOSIS — J479 Bronchiectasis, uncomplicated: Secondary | ICD-10-CM

## 2010-10-14 MED ORDER — ALBUTEROL SULFATE HFA 108 (90 BASE) MCG/ACT IN AERS: 2.0000 | INHALATION_SPRAY | Freq: Four times a day (QID) | RESPIRATORY_TRACT | Status: DC | PRN

## 2010-10-14 MED ORDER — HYDROCODONE-ACETAMINOPHEN 10-325 MG PO TABS: ORAL_TABLET | ORAL | Status: DC

## 2010-10-14 MED ORDER — CLARITHROMYCIN 500 MG PO TABS: 500.0000 mg | ORAL_TABLET | Freq: Two times a day (BID) | ORAL | Status: AC

## 2010-10-14 NOTE — Patient Instructions (Signed)
biaxin one twice daily for 10days Sputum culture Norco refilled Refill on proair Chest xray today Return 4 months, I will call with results and decide about IV antibiotics

## 2010-10-14 NOTE — Progress Notes (Signed)
Quick Note:  Notify the patient that the Xray is stable and no pneumonia No change in medications are recommended. Continue current meds as prescribed at last office visit ______ 

## 2010-10-14 NOTE — Progress Notes (Signed)
Subjective:    Patient ID: Brooke [name], female    DOB: 1940/08/18, 70 y.o.   MRN: [id number]  HPI  70 y.o.WF with Bronchiectasis.   Coughing up olive green.  Seems more than usual.  ? A lot down ? Notes more dyspnea.  Notes mucus out of nose, and ears stopped up.  Nose running and pndrip. Yellow out of nose. Notes some chest pain.    Past Medical History  Diagnosis Date  . Hypothyroidism   . History of allergy   . Mycobacterium avium complex   . Bronchiectasis     colonized mrsa, s/p vancomyin IV 10 days 5/10 and 7 days 6/10     History reviewed. No pertinent family history.   History   Social History  . Marital Status: Widowed    Spouse Name: N/A    Number of Children: N/A  . Years of Education: N/A   Occupational History  . Not on file.   Social History Main Topics  . Smoking status: Former Smoker -- 1.0 packs/day for 3 years    Types: Cigarettes    Quit date: 06/08/1962  . Smokeless tobacco: Never Used  . Alcohol Use: Not on file  . Drug Use: Not on file  . Sexually Active: Not on file   Other Topics Concern  . Not on file   Social History Narrative  . No narrative on file     Allergies  Allergen Reactions  . Levofloxacin   . Mometasone Furoate     REACTION: swollen throat, thrush  . Penicillins   . Sulfonamide Derivatives      Outpatient Prescriptions Prior to Visit  Medication Sig Dispense Refill  . ALPRAZolam (XANAX) 0.5 MG tablet Take 1 tablet (0.5 mg total) by mouth 3 (three) times daily as needed for anxiety.  90 tablet  0     Review of Systems Constitutional:   No  weight loss, night sweats,  Fevers, chills, fatigue, lassitude. HEENT:   Notes  headaches,  Difficulty swallowing,  Tooth/dental problems,  Sore throat,                No sneezing, itching, ear ache, notes nasal congestion, notes  post nasal drip,   CV:  Notes  chest pain,  Orthopnea, PND, swelling in lower extremities, anasarca, dizziness, palpitations  GI  No heartburn,  indigestion, abdominal pain, nausea, vomiting, diarrhea, change in bowel habits, loss of appetite  Resp: Notes  shortness of breath with exertion not  at rest.  Notes  excess mucus, notes productive cough,  No non-productive cough,  No coughing up of blood.  No change in color of mucus.  No wheezing.  No chest wall deformity  Skin: no rash or lesions.  GU: no dysuria, change in color of urine, no urgency or frequency.  No flank pain.  MS:  No joint pain or swelling.  No decreased range of motion.  No back pain.  Psych:  No change in mood or affect. No depression or anxiety.  No memory loss.     Objective:   Physical Exam Filed Vitals:   10/14/10 1035  BP: 106/58  Pulse: 68  Temp: 98.1 F (36.7 C)  TempSrc: Oral  Height: 5\' 5"  (1.651 m)  Weight: 112 lb 3.2 oz (50.894 kg)  SpO2: 93%    Gen: thin , in no distress,  normal affect  ENT: No lesions,  mouth clear,  oropharynx clear, no postnasal drip  Neck: No JVD, no TMG,  no carotid bruits  Lungs: No use of accessory muscles, no dullness to percussion,  Rhonchi scattered  Cardiovascular: RRR, heart sounds normal, no murmur or gallops, no peripheral edema  Abdomen: soft and NT, no HSM,  BS normal  Musculoskeletal: No deformities, no cyanosis or clubbing  Neuro: alert, non focal  Skin: Warm, no lesions or rashes      CXR 5/12 IMPRESSION: Significant chronic lung changes without acute overlying pulmonary process.     Assessment & Plan:   BRONCHIECTASIS Bronchiectasis flare , may be MRSA Plan Biaxin 500mg  bid x 10days Sputum c/s If mrsa will need home IV vanco Cannot yet clear for cataract surgery Note CXR shows NAD    Updated Medication List Outpatient Encounter Prescriptions as of 10/14/2010  Medication Sig Dispense Refill  . albuterol (PROAIR HFA) 108 (90 BASE) MCG/ACT inhaler Inhale 2 puffs into the lungs every 6 (six) hours as needed for wheezing.  1 Inhaler  6  . ALPRAZolam (XANAX) 0.5 MG tablet Take 1  tablet (0.5 mg total) by mouth 3 (three) times daily as needed for anxiety.  90 tablet  0  . CENESTIN 0.625 MG tablet Once daily      . citalopram (CELEXA) 20 MG tablet Once daily      . HYDROcodone-acetaminophen (NORCO) 10-325 MG per tablet 1 - 2 by mouth Every 4 hours as needed  60 tablet  0  . levothyroxine (SYNTHROID, LEVOTHROID) 100 MCG tablet 1/2 tablet once daily      . triamterene-hydrochlorothiazide (MAXZIDE) 75-50 MG per tablet 1/2 tablet once daily      . DISCONTD: HYDROcodone-acetaminophen (NORCO) 10-325 MG per tablet Every 4 hours as needed      . DISCONTD: VENTOLIN HFA 108 (90 BASE) MCG/ACT inhaler 1-2 puffs every 4-6 hours as needed      . clarithromycin (BIAXIN) 500 MG tablet Take 1 tablet (500 mg total) by mouth 2 (two) times daily.  20 tablet  0

## 2010-10-14 NOTE — Assessment & Plan Note (Signed)
Bronchiectasis flare , may be MRSA Plan Biaxin 500mg  bid x 10days Sputum c/s If mrsa will need home IV vanco Cannot yet clear for cataract surgery Note CXR shows NAD

## 2010-10-15 NOTE — Progress Notes (Signed)
Quick Note:  Called, spoke with pt. She was informed of CXR results and recs per PW. She verbalized understanding of these results and recs. ______ 

## 2010-10-17 ENCOUNTER — Telehealth: Payer: Self-pay | Admitting: Critical Care Medicine

## 2010-10-17 DIAGNOSIS — J470 Bronchiectasis with acute lower respiratory infection: Secondary | ICD-10-CM

## 2010-10-17 MED ORDER — LINEZOLID 2 MG/ML IV SOLN: 600.0000 mg | Freq: Two times a day (BID) | INTRAVENOUS | Status: AC

## 2010-10-17 NOTE — Telephone Encounter (Signed)
Order given to [facility] TO START IV MEDS

## 2010-10-17 NOTE — Telephone Encounter (Signed)
Sputum c/s pos mrsa,  Will order 7days IV zyvox 600mg  bid

## 2010-10-18 LAB — RESPIRATORY CULTURE OR RESPIRATORY AND SPUTUM CULTURE

## 2010-10-20 ENCOUNTER — Telehealth: Payer: Self-pay | Admitting: Critical Care Medicine

## 2010-10-20 ENCOUNTER — Ambulatory Visit (HOSPITAL_COMMUNITY): Payer: MEDICARE

## 2010-10-20 ENCOUNTER — Other Ambulatory Visit: Payer: Self-pay | Admitting: Critical Care Medicine

## 2010-10-20 DIAGNOSIS — A4902 Methicillin resistant Staphylococcus aureus infection, unspecified site: Secondary | ICD-10-CM

## 2010-10-20 NOTE — Telephone Encounter (Signed)
CORRECT NAME OF MED: ZYZOX. Tivis Ringer

## 2010-10-20 NOTE — Telephone Encounter (Signed)
Spoke w/ pt and she states she states she has not heard about getting her medline set up. Pt states she has not heard from anyone about this. Pt is aware Almyra Free will be calling her this morning about this. Pt states she is on her last day of her peripheral IV of zyvox and just doesn't want to run out. Pt states she has been running a fever of 99-100 and is still coughing. Pt states she believes she has pleurisy. Please advise [name]. Thanks.    Allergies  Allergen Reactions  . Levofloxacin   . Mometasone Furoate     REACTION: swollen throat, thrush  . Penicillins   . Sulfonamide Derivatives    Carver Fila, CMA

## 2010-10-20 NOTE — Telephone Encounter (Signed)
Spoke with Amy and made aware okay to hold celexa x 7 days.

## 2010-10-20 NOTE — Telephone Encounter (Signed)
Ok to hold celexa for 7days

## 2010-10-20 NOTE — Telephone Encounter (Signed)
Called and spoke w/ [facility] to intiate pt PA. Since this was already started over the phone they could not send over fax to have [name] fill out. [facility] needs the follow questions answer: Can pt take oral zyvox and if not why?, does she has community acquired PNA?, Complicated skin and skin structure infection?, Uncomplicated skin and skin structure infection?, Is she Vancomycin resistant?, Has pt tried and failed tetracycline, bactrim, and clindamycin?Marland Kitchen Once these questions have been answered then nurse to call [phone] and give ref# [id number] to finish PA. Please advise [name]. Thanks.

## 2010-10-20 NOTE — Telephone Encounter (Signed)
She cannnot take/tolerate po zyvox She has MRSA bronchiectasis with early Pneumonia She cannot take/tolerate / or respond to po cleocin/bactrim or doxycycline She has failed vancomycin in the past

## 2010-10-20 NOTE — Telephone Encounter (Signed)
This has been handled.

## 2010-10-20 NOTE — Telephone Encounter (Signed)
Zyvox APPROVED from 10/20/2010 to 11/03/2010. Ref # [id number]. [facility] aware of approval.

## 2010-10-20 NOTE — Telephone Encounter (Signed)
Spoke with Amy at [facility]- she states that pt should hold celexa while on zyzox since med can cause neurotoxicity. She states that holding celexa for 7 days would help with this. Pls advise if okay to hold.  Thanks

## 2010-10-21 ENCOUNTER — Other Ambulatory Visit: Payer: Self-pay | Admitting: Critical Care Medicine

## 2010-10-21 ENCOUNTER — Ambulatory Visit (HOSPITAL_COMMUNITY)
Admission: RE | Admit: 2010-10-21 | Discharge: 2010-10-21 | Disposition: A | Discharge status: Home / Self Care | Payer: MEDICARE | Source: Ambulatory Visit | Attending: Critical Care Medicine | Admitting: Critical Care Medicine

## 2010-10-21 DIAGNOSIS — A4902 Methicillin resistant Staphylococcus aureus infection, unspecified site: Secondary | ICD-10-CM

## 2010-10-21 DIAGNOSIS — J479 Bronchiectasis, uncomplicated: Secondary | ICD-10-CM | POA: Insufficient documentation

## 2010-10-21 NOTE — Discharge Summary (Signed)
Brooke [name], Brooke [name] NO.:  [id number]   MEDICAL RECORD NO.:  [id number]          PATIENT TYPE:  INP   LOCATION:  1308                         FACILITY:  [facility]   PHYSICIAN:  Kalman Shan, MD   DATE OF BIRTH:  06-08-1941   DATE OF ADMISSION:  10/04/2008  DATE OF DISCHARGE:  10/12/2008                               DISCHARGE SUMMARY   DISCHARGE DIAGNOSES:  1. Bronchiectasis flare with positive methicillin-resistant      Staphylococcus aureus  pneumonia. Sputum cleared to normal flora on      10/09/2008  2. Failure to thrive.   LABORATORY DATA:  Vancomycin trough Oct 11, 2008:  27.1.  Sputum culture  Oct 09, 2008:  Normal  oropharyngeal type flora.  Phosphorus Oct 10, 2008:  3.7.  Magnesium 1.8  on Oct 10, 2008.  Oct 09, 2008:  Sodium 140, potassium 3.8, chloride 105,  CO2 30, glucose 70, BUN 5, creatinine 0.68, white blood cell count 5.6,  hemoglobin 9.5, hematocrit 28, platelet count 251.   DIAGNOSTICS:  CT scan of chest without contrast demonstrates small left  effusion, this layers independently.  There is bronchopneumonia in the  left lower lobe, collapse in the right middle lobe.  Patchy airspace  disease consistent with bronchopneumonia with mild bronchiectasis in the  lower lobes.   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  She  presented to the office on September 24, 2008 with a 2 week history of  progressive thick mucus, dyspnea and increased fatigue.  She had  previously been treated with a 7 day course of Levaquin.  Apparently  prior to Levaquin administration, she did have sputum cultures obtained  for which ultimately grew out methicillin-resistant staph aureus.  Because of this, she was admitted for IV antibiotics.   HOSPITAL  COURSE:  1. Bronchiectasis flare with methicillin-resistant Staphylococcus      aureus pneumonia.  Ms. Nordmeyer was admitted to the regular medical      ward.  Peripherally inserted central venous catheter was placed in      anticipation of long-term antibiotics.  This was placed in the      right basilic vein on October 05, 2008.  Vancomycin infusion was      initiated on hospital day of admit.  She continues on daily      vancomycin infusions.  Upon time of discharge, she will complete 6      more days of outpatient vancomycin.  In addition to vancomycin, her      pulmonary regimen will include scheduled inhaled bronchodilators,      twice daily Mucinex, and slow progression of activity.  She was      also provided a flutter valve upon time of discharge.Note MRSA      cleared on repeat sputum 10/09/2008   1. Failure to thrive.  For this, she was instructed to continue with  nutritional supplementation.  She prefers Ensure shakes.  2. Electrolyte abnormalities - she had repeated issues with low      potassium, magnesium and phosphorius - these were aggressively      replaced and corrected   DISCHARGE INSTRUCTIONS:  Diet as tolerated.  Instructed to try to get a  minimum of 40 gm of protein a day and at least 15-20 gm carbohydrates  with each meal.   FOLLOW UP:  1. Follow up with nurse practitioner, Tammy Parrett Oct 17, 2008.  2. [name] Oct 30, 2008.   DISCHARGE MEDICATIONS:  1. Celexa 10 mg half tab daily.  2. Cenestin 0.625 mg tab daily.  3. Synthroid 100 mcg half tab daily.  4. Maxzide 75/50 half tab daily as needed.  5. Mucinex 600 mg 2 tablets twice a day.  6. Albuterol 2.5 mg neb 4 times a day followed by flutter valve.  7. Alprazolam 0.5 mg tab up to 3 times a day as needed.  8. Vancomycin 1250 mg IV daily for 6 more days.  9. Hydromet 5/1.5 mg per 5 mL may take 1 teaspoon as needed every 4-6      hours as needed for cough.  10.Hycodan/acetaminophen 10/325 one every 4  hours as needed for pain.  11.Proventil HFA 2 puffs as needed every 6 hours for shortness of      breath.   DISPOSITION:  Ms. Pfeffer has met maximum benefit from inpatient care.  She  is now medically cleared for discharge.   Over 1 hour of time was dedicated to discharge assessment, planning and  counseling.  She will have close outpatient followup and will be  discharged to home at this time.      Zenia Resides, NP      Kalman Shan, MD  Electronically Signed    PB/MEDQ  D:  10/12/2008  T:  10/12/2008  Job:  [id number]

## 2010-10-21 NOTE — H&P (Signed)
NAMEJULIENNE, VOGLER NO.:  [id number]   MEDICAL RECORD NO.:  [id number]          PATIENT TYPE:  INP   LOCATION:  1308                         FACILITY:  [facility]   PHYSICIAN:  Charlcie Cradle. Delford Field, MD, FCCPDATE OF BIRTH:  1940/08/25   DATE OF ADMISSION:  10/04/2008  DATE OF DISCHARGE:                              HISTORY & PHYSICAL   CHIEF COMPLAINT:  Methicillin-resistant Staphylococcus aureus positive  sputum culture.   HISTORY OF PRESENT ILLNESS:  Ms. Nordell is a 70 year old frail white  female, a former smoker, with a history of Microbacterium avium and  secondary infection with associated bronchiectasis but no overt evidence  of immunodeficiency based on extensive workup done at [facility].  She  has been off of anti-tuberculin therapy since May 2007, (Zithromax,  ethambutol and Rifabutin).  She presented to the pulmonary office on  09/24/2008, with complaints of a two-week history of thick green mucus  and increasing dyspnea as well as increase in severe chest pain, right  greater than left and fatigue.  At that time she was treated for  bronchiectasis with a 7-day course of Levaquin.  A sputum culture was  obtained and she was to follow up in one month; however, sputum cultures  returned today and were positive for methicillin-resistant  Staphylococcus aureus.  The patient was a direct admission for the  initiation of IV antibiotics.   PAST MEDICAL HISTORY:  1. Hypothyroidism.  2. Microbacterium avium complex bacteremia.  3. Bronchiectasis.  4. Failure to thrive.   FAMILY HISTORY:  Noncontributory.   SOCIAL HISTORY:  The patient is widowed.  She lives by herself.  She has  a [employer] architect.  Does not smoke or use alcohol.  Has never used  illegal drugs.   PAST SURGICAL HISTORY:  1. Had a partial hysterectomy in 1969.  2. A C-section with a cyst removed in the past.   ALLERGIES:  PENICILLIN AND SULFA.   MEDICATIONS PRIOR TO ADMISSION:  1. Celexa  10 mg tab, 1/2 tab by mouth daily.  2. Hydrocodone/acetaminophen 10/325 mg, one by mouth q.4h. as needed      for pain.  3. Hydromet one teaspoonful q.4-6h. as needed for cough.  4. Levaquin 750 mg tab, one tab by mouth daily, which was initiated on      09/24/2008.  5. Alprazolam 0.5 mg tab, one tab by mouth three times daily as      needed.  6. Cenestin 0.625 mg tab once daily.  7. Synthroid 50 mcg once daily.  8. Proventil HFA inhaler, two puffs q.6h. as needed.  9. Maxzide 75/50 mg, 1/2 tab once daily as needed for swelling.  10.Mucinex 600 mg, one to two tab q.12h. as needed with flutter valve      use.   REVIEW OF SYSTEMS:  GENERAL:  The patient complains of fatigue, fevers  and chills.  Denies night sweats.  Reports being unable to gain weight.  SKIN:  Denies changes in nails or hair.  HEENT:  Denies changes in vision, blurring of vision, hearing loss or  dizziness, but  does complain of a headache.  Denies nasal discharge,  sneezing or itching.  RESPIRATORY:  Denies known wheezing, hemoptysis but does complain of a  productive cough with green sputum with noted increase over the past  week in sputum quantity.  Complains of shortness of breath with  exertion; however, reports limited activity in the past two weeks.  CARDIAC:  Denies swelling of legs or extremities.  Denies palpitations.  GI:  Reports very little appetite with nausea of late with Levaquin use.  Denies vomiting or diarrhea or changes in bowel movements or frequency.  GENITOURINARY:  Denies frequency, urgency or hesitation with urination.   PHYSICAL EXAMINATION:  VITAL SIGNS:  Temperature 98.5 degrees orally,  heart rate 70, respirations 16, blood pressure 122/96 with O2 saturation  of 99% on room air.  GENERAL:  This is a cachectic, frail, ill-appearing white female, in no  acute distress.  NEUROLOGIC:  The patient is awake, alert and oriented.  Cranial nerves  appear grossly intact.  Speech is clear.  Pupils  equal, reactive.  Moves  all extremities without difficulty.  CARDIOVASCULAR:  S1 and S2, a regular rate and rhythm, without murmurs,  rubs or gallops.  No jugular venous distention noted.  EXTREMITIES:  Radial and pedal pulses +2 without edema.  LUNGS:  Respirations even and unlabored with decreased anterior breath  sounds, posterior coarse with scattered rhonchi and expiratory wheezes.  GI/GENITOURINARY:  Abdomen is soft, scaphoid and nontender to palpation.  SKIN:  Pink, warm and dry.   LABORATORY DATA:  Admission labs are pending, as well as a chest x-ray  and electrocardiogram.   ASSESSMENT/PLAN:  Bronchiectasis with methicillin-resistant  Staphylococcus aureus positive sputum culture:  The patient is to be  admitted to the medical floor and a percutaneous indwelling central  catheter line to be placed for the administration of IV antibiotics.  Vancomycin will be dosed per pharmacy.  The patient's home medication  regimen will be continued, to include Mucinex for pulmonary hygiene, as  well as the use of flutter valve and bronchodilators.      Canary Brim, NP      Charlcie Cradle. Delford Field, MD, [facility]  Electronically Signed    BO/MEDQ  D:  10/04/2008  T:  10/04/2008  Job:  [id number]

## 2010-10-21 NOTE — Assessment & Plan Note (Signed)
[facility]                             PULMONARY OFFICE NOTE   NAME:[name], [name]                         MRN:          [id number]  DATE:04/20/2007                            DOB:          May 16, 1941    HISTORY OF PRESENT ILLNESS:  Patient is a 70 year old white female  patient of Dr. Delford [name] who has a known history of bronchiectasis with  history of microbacterium avium infection who presents today for an  acute office visit.  Patient has not been seen in the office for more  greater than a year.  Reports complains that over the last week she has  had increased cough, congestion, and shortness of breath.  She complains  that mucus is very thick and yellow.  Patient denies any hemoptysis,  orthopnea, PND, leg swelling, or weight loss.   PAST MEDICAL HISTORY:  Reviewed.   CURRENT MEDICATIONS:  Reviewed.   PHYSICAL EXAMINATION:  Patient is a pleasant female in no acute  distress.  She is afebrile with stable vital signs.  Her O2 saturation is 97% on  room air.  HEENT:  Unremarkable.  NECK:  Supple without cervical adenopathy.  No JVD.  LUNG SOUNDS:  Reveal some coarse rhonchi bilaterally.  CARDIAC:  Regular rate.  ABDOMEN:  Soft and nontender.  EXTREMITIES:  Warm without any edema.   IMPRESSION AND PLAN:  Acute tracheobronchitis in a patient who has  underlying bronchiectasis.  Patient is to begin Biaxin XL pack 1,  Mucinex DM twice daily.  Patient is return back with Dr. Delford [name] in 2  weeks, or sooner if needed.      Rubye Oaks, NP  Electronically Signed      Charlcie Cradle Brooke Field, MD, [facility]  Electronically Signed   TP/MedQ  DD: 04/21/2007  DT: 04/21/2007  Job #: [id number]

## 2010-10-24 NOTE — H&P (Signed)
Brooke [name], RIESE NO.:  [id number]   MEDICAL RECORD NO.:  [id number]          PATIENT TYPE:  EMS   LOCATION:  ED                           FACILITY:  [facility]   PHYSICIAN:  Shan Levans, M.D. LHCDATE OF BIRTH:  11-Dec-1940   DATE OF ADMISSION:  07/29/2004  DATE OF DISCHARGE:                                HISTORY & PHYSICAL   CHIEF COMPLAINT:  Left lower lobe pneumonia.   HISTORY OF PRESENT ILLNESS:  This is a 70 year old white female with a  history of Mycobacterium avium-intracellulare infection with chronic  bronchiectasis in the left lower lobe.  She had recently been on Biaxin,  ethambutol, and rifabutin between September, 2005 until January, 2006 when  we had to discontinue these drugs because of a concern over porphyria and  whether or not the drugs were contributing to this type of reaction versus  primary porphyria.  She has had an extensive workup with Dr. Jimmy Footman  because she was having progressive yellowing discoloration in the skin,  progressive emesis, wasting, anorexia, and failure to thrive.  Pulmonary-  wise, the patient had been doing fairly well until two weeks ago, when she  developed increasing cough, increased chest congestion, increased shortness  of breath. The patient's symptoms progressed to the point where she comes to  the office today in a failure-to-thrive state.   PAST MEDICAL HISTORY:  1.  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  2.  History of recurrent sinusitis and pneumonia for many years.  Multiple      antibiotics.  3.  History of recurrent sinusitis.  4.  History of weight loss, failure to thrive, chronic hypothyroidism.   MEDICATIONS PRIOR TO ADMISSION:  1.  Synthroid 0.112 mcg daily.  2.  Maxzide 1 daily.  3.  Cenestin daily.   FAMILY HISTORY:  Noncontributory.   ALLERGIES:  Allergic to SULFA and PENICILLIN.   SOCIAL  HISTORY:  Patient is widowed.  Lives by herself.  Has a college  education.  Does not smoke or use ethanol.  Has never used illegal drugs.   OPERATIVE HISTORY:  1.  History of hysterectomy in 1969.  2.  A C-section with a cyst removed in the past.   PHYSICAL EXAMINATION:  VITAL SIGNS:  Temp 98, blood pressure 112/80, pulse  90, sats 90% on room air.  GENERAL:  This is an ill-appearing, cachectic white female with sallow,  yellow coloration of the skin.  HEENT:  Nares are clear.  NECK:  No jugular venous distention.  Neck supple.  LUNGS:  Rhonchi in the left lower lobe with a few rales.  HEART:  Regular rate and rhythm with S3.  Normal S1 and S2.  ABDOMEN:  Scaphoid.  Nontender.  NEUROLOGIC:  Intact.  SKIN:  Clear.   LABORATORY DATA:  Chest x-ray was obtained and showed a left lower lobe  infiltrate, which was acute.  No other acute changes seen.   IMPRESSION:  Left lower lobe community-acquired pneumonia in a patient with  underlying bronchiectasis, failure to thrive, weight loss, underlying  potential for porphyria.  Potential immunosuppressed state.   RECOMMENDATIONS:  Will proceed with admission, administration of IV  Cefepime, IV vancomycin, IV Zithromax, intensive neb treatments.  Will ask  Dr. Jimmy Footman to see in consultation.      PW/MEDQ  D:  07/29/2004  T:  07/29/2004  Job:  [id number]   cc:   Louanna Raw  [address]  [place]  [place] [address]  Fax: [phone]

## 2010-10-24 NOTE — Assessment & Plan Note (Signed)
[facility]                               PULMONARY OFFICE NOTE   NAME:Brooke [name]                         MRN:          [id number]  DATE:12/16/2005                            DOB:          16-Jul-1940    Ms. Brooke [name] is a 70 year old white female with history of Mycobacterium avium  and secondary infection with associated bronchiectasis, but no overt  evidence of immunodeficiency based on an extensive workup done at [facility].  She is now off anti-tuberculous therapy  including off the Zithromax, ethambutol, and Rifabutin. Since May of this  year she has noted no attendant problems with this, maintains omeprazole 20  mg daily, has minimal cough production.   PHYSICAL EXAMINATION:  VITAL SIGNS: On exam, temperature 97, blood pressure  94/60, pulse 61, saturation 98% on room air.  CHEST:  Distant breath sounds with no evidence of active wheeze or rhonchi.  CARDIAC:  Regular rate and rhythm without S3.  Normal S1 and S2.  ABDOMEN:  Soft, nontender. Bowel sounds are active.  EXTREMITIES:  No edema or clubbing.   IMPRESSION:  Bronchiectasis with history of Mycobacterium avium, stable at  this time.   PLAN:  To hold off on antimycobacterial therapy at this time and will see  the patient back in return follow-up in four months.                                   Charlcie Cradle Delford Field, MD, FCCP   PEW/MedQ  DD:  12/16/2005  DT:  12/16/2005  Job #:  [id number]   cc:   Louanna Raw

## 2010-10-24 NOTE — Assessment & Plan Note (Signed)
[facility]                           PRIMARY CARE OFFICE NOTE   NAME:[name], [name]                         MRN:          [id number]  DATE:05/19/2006                            DOB:          12/11/1940    REFERRING PHYSICIAN:  Charlcie Cradle. Delford Field, MD, FCCP   CHIEF COMPLAINT:  New patient to practice.   HISTORY OF PRESENT ILLNESS:  The patient is a 70 year old white female  with complex medical history including Mycobacterium avium intracellular  infection chronically with a history of bronchiectasis, recurrent  sinusitis, weight loss, here to establish primary care.  The patient has  been under the care of [name] for the last 2 years.  She has been on  chronic antibiotics for treatment of Mycobacterium, however, according  to Dr. Lynelle Doctor last note, she is stable in this regard and has been  discontinued on most of her antibiotics.  A review of the chart also  notes that she has been seen by Dr. Everardo All regarding chronic weight  loss, failure to thrive, hypothyroidism.  She underwent a workup for  adrenal insufficiency which was unremarkable.   Her main complaint today has been episode that she gets twice a year  at least where she notes a decrease in vision, decreased smell, positive  nasal congestion, vertigo, and poor appetite.  She did see a physician  at urgent care who treated her with antibiotics erythromycin.  The  patient notes no improvement.  Her most recent episode has been ongoing  for the last month.  She notes green nasal discharge, no fever, and the  above-mentioned symptoms.  She does have a headache associated with the  episodes.  No recent CAT scan of her sinuses.  She does use a daily  regimen of saline nasal washes twice daily.   She does not have a history of hypertension, yet she is on Maxzide or a  diuretic.  She states that she has had issues with lower extremity  swelling which has been uncontrolled without Maxzide.   PAST MEDICAL HISTORY SUMMARY:  1. History of bronchiectasis.  2. History of Mycobacterium avium intracellulare infection.  3. History of chronic sinusitis.  4. History of pneumonia.  5. History of failure to thrive/weight loss.  6. Chronic hypothyroidism.  7. Hysterectomy in 1969.  8. C-section with cyst removed in the remote past.   CURRENT MEDICATIONS:  1. Synthroid 112 mcg daily.  2. Maxzide 75/50, one a day.  3. Lexapro 10 mg once a day.  4. __________  0.625 mg once a day.  5. Saline nasal wash b.i.d.  6. Calcium and vitamin D once a day.  7. Omeprazole 20 mg once a day.  8. Xanax 0.5 mg q.8 h. p.r.n.   ALLERGIES TO MEDICATIONS:  1. SULFA.  2. PENICILLIN.   SOCIAL HISTORY:  The patient is widowed and lives by herself.  She does  have daughters who are supportive in the area.  She denies any tobacco  use currently, was a smoker in the remote past.  No history of alcohol  or recreational  drugs.   FAMILY HISTORY:  Positive for alcoholism in her father and hypertension  in her mother.   REVIEW OF SYSTEMS:  No fevers, chills.  HEENT:  Symptoms as noted above.  The patient denies any chest pain, occasional irregular heart beat but  no prolonged palpitations.  Denies cough, shortness of breath, no  nausea, vomiting, constipation, diarrhea.  No dark stools or blood in  her stool.  She has had a colonoscopy in 2007.   PHYSICAL EXAMINATION:  VITAL SIGNS:  Height is 5 feet 5 inches, weight  is 108 pounds, temperature is 97, pulse is 86.  BP is 99/64 sitting.  She does drop to 80/60 with standing.  GENERAL:  The patient is a thin 70 year old white female who appears  slightly older than stated age.  HEENT:  Normocephalic atraumatic.  Pupils were equal and reactive to  light bilaterally.  Extraocular movements intact.  The patient was  anicteric.  Conjunctivae and lids within normal limits.  There was no  evidence of nystagmus.  Examination of her right external auditory canal   reveals changes of psoriasis and some cerumen impaction.  The left side  was normal.  Oropharynx was unremarkable.  NECK:  Supple.  No adenopathy, carotid bruit, or thyromegaly.  CHEST:  Normal respiratory effort.  Chest was clear to auscultation  bilaterally.  No rhonchi, rales, or wheezing.  CARDIOVASCULAR:  Regular rate and rhythm.  No significant murmurs, rubs,  or gallops appreciated.  ABDOMEN:  Soft, nontender.  Positive bowel sounds.  No organomegaly.  MUSCULOSKELETAL:  No clubbing, cyanosis, or edema.  SKIN:  Warm and dry.  NEUROLOGIC:  Cranial nerves II-XII were grossly intact.  She was  nonfocal.  We were able to elicit some vertigo-like symptoms with Piedad Climes-  Hallpike maneuver.   IMPRESSION:  1. Dizziness/vertigo with associated headache.  2. History of chronic sinusitis.  3. History of bronchiectasis.  4. History of Mycobacterium avium intracellulare chronic infection.  5. Failure to thrive.  6. Hypothyroidism.  7. Health maintenance.   RECOMMENDATIONS:  1. I suspect the patient's dizziness symptoms may be due to      orthostatic hypotension and advised  tapering her Maxzide to      37.5/25 a day.  Ultimately, I would like to taper off Maxzide      altogether.  2. We will need to rule out the possibility of chronic sinusitis and      the patient will be sent for a CT scan of her sinuses.  Other      possibilities are possible migraine headache and benign positional      vertigo.   I reviewed her recent labs and her electrolytes and creatinine are  within normal limits.  At this time, there does not seem to be any  organic reason for her failure to thrive/weight loss.  I suspect there  may be underlying psychiatric etiology.   Followup will be in approximately 2 weeks.     Barbette Hair. Artist Pais, DO  Electronically Signed    RDY/MedQ  DD: 05/19/2006  DT: 05/19/2006  Job #: [id number]

## 2010-10-24 NOTE — Discharge Summary (Signed)
NAMEZHAVIA, CUNANAN NO.:  [id number]   MEDICAL RECORD NO.:  [id number]          PATIENT TYPE:  INP   LOCATION:  0446                         FACILITY:  [facility]   PHYSICIAN:  Shan Levans, M.D. LHCDATE OF BIRTH:  Sep 08, 1940   DATE OF ADMISSION:  07/29/2004  DATE OF DISCHARGE:                                 DISCHARGE SUMMARY   DISCHARGE DIAGNOSES:  1.  Left lower lobe community-acquired pneumonia (no organism specified.  2.  Hypothyroidism.  3.  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  4.  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   LABORATORY DATA:  Serum cortisol on August 04, 2004 was 3.2 - this is  drawn in the afternoon at 1:30 p.m. Sodium 137, potassium 4.2, BUN 8,  creatinine 0.7, AST 23, ALT 17, albumin 1.9. All microbiology data was  negative on this admission. Phosphorus at 2.4 on August 02, 2004. TSH was  0.817. White blood cells on August 02, 2004 were 11.8, hemoglobin 10.7,  platelets 424. Prealbumin on August 01, 2004 is 7.2.   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 She had recent treatment with Biaxin and ethambutol and  rifampin between September 2005 and January 2006. These drugs were  discontinued because of concern over porphyria and whether or not the drugs  were contributing to this type of reaction versus primary porphyria. She had  an extensive workup by Dr. Jimmy Footman given her yellowish discoloration of  skin, progressive emesis, wasting, anorexia, and failure-to-thrive. From  pulmonary perspective, she had been doing fairly well until about 2 weeks  prior to admission when she developed increased cough, increased chest  congestion, pleuritic chest pain, and shortness of breath. Her symptoms  progressed to the point where she came to the office in a failure-to-thrive  state. She was  admitted to [facility], treated empirically with  aggressive IV antibiotics with cefepime, Zithromax, and vancomycin - all  began on July 29, 2004. Antibiotics were continued empirically with the  vancomycin and Zithromax being discontinued on July 31, 2004 and  cefepime discontinued on August 01, 2004 and started on an oral Cipro. The  patient will continue a complete 10-day course of antibiotics for this  presumed bacterial pneumonia. Chest x-ray the day after admission  demonstrated bilateral pulmonary infiltrates. Her condition over the course  of her admission has continued to improve. Her pleuritic chest pain has  improved, her breathing is less labored, she is currently on room air. Her  exam is with some mild crackles in the bases but according to previous chart  review it appears that this is close to her baseline. The patient will  continue a 10-day course of antibiotics and will be seen in follow-up in an  outpatient basis.   #2 - HYPOTHYROIDISM. This was treated per her normal maintenance Synthroid  dose.   #3 - FAILURE-TO-THRIVE. The patient during her hospitalization is cachectic  and wasting with a prealbumin of 7.2. She was encouraged  to increase her  p.o. intake. Nutritional consultation was requested. She was started on  Megace orally for appetite stimulation in addition to occasional protein  shakes. She leaves today with improved oral intake. This will need to be  followed up on an outpatient basis.   #4 - MYCOBACTERIUM AVIUM. No acute treatment was prescribed during this  hospitalization. However, therapy will need to be reevaluated on an  outpatient basis.   DISCHARGE INSTRUCTIONS:   MEDICATIONS:  1.  Protonix 40 mg p.o. daily before breakfast.  2.  Synthroid 0.112 mcg tablets once a day.  3.  Cenestin 0.625 mg once a day.  4.  Multivitamin one a day.  5.  Megace 40 mg p.o. by mouth daily.  6.  Mucinex D one tablet twice a day as  needed.  7.  Saline nasal spray.  8.  Cipro 400 mg p.o. twice a day for 2 more days.  9.  Pain management is with Norco one pill as needed q.6h.   DIET:  As tolerated.   WOUND CARE:  Not applicable.   FOLLOW-UP:  With [name] on August 08, 2004 at 4:15 p.m.   DISPOSITION UPON DISCHARGE:  The patient feels much improved in comparison  to time of admission. She is afebrile. Her oxygen saturations range from 95-  99% on room air, her pulse rate is 79-80, blood pressure in the 90s over  40s, respirations are nonlabored. She does have some mild crackles in the  bases, left greater than right. Heart tones are normal. Abdomen soft,  nontender, tolerating diet, and has had a bowel movement. She reports  understanding of her discharge instructions and a readiness to go home.      PB/MEDQ  D:  08/05/2004  T:  08/05/2004  Job:  [id number]   cc:   Louanna Raw  [address]  [place]  [place] [address]  Fax: [phone]

## 2010-10-24 NOTE — Op Note (Signed)
NAME:  Brooke [name], Brooke [name]                            ACCOUNT NO.:  [id number]   MEDICAL RECORD NO.:  [id number]                   PATIENT TYPE:  AMB   LOCATION:  CARD                                 FACILITY:  [facility]   PHYSICIAN:  Shan Levans, M.D. [facility]            DATE OF BIRTH:  07/21/1940   DATE OF PROCEDURE:  01/08/2004  DATE OF DISCHARGE:                                 OPERATIVE REPORT   CHIEF COMPLAINT:  Chronic cough with granulomatous changes on chest x-ray.   OPERATOR:  Shan Levans, M.D.   ANESTHESIA:  Xylocaine 1% local.   PREMEDICATIONS:  Demerol 30 mg IV push, Versed 4 mg IV push.   PROCEDURE:  The Pentax video bronchoscope was introduced into the left  naris.  The upper airways were visualized and were unremarkable.  The entire  tracheobronchial tree was visualized and revealed diffuse tracheal  bronchitis. Attention was then paid to the left lower lobe.  Bronchioalveolar lavage was obtained with 180 cc infused and 80 cc returned.  Transbronchial biopsies x5 were obtained from the left lower lobe lateral  segment.   COMPLICATIONS:  None.   IMPRESSION:  Probable granulomatous disease of the lung, rule out atypical  Mycobacterial infection.   RECOMMENDATIONS:  Follow up microbiology and histology.                                               Shan Levans, M.D. [facility]    PW/MEDQ  D:  01/08/2004  T:  01/08/2004  Job:  [id number]   cc:   Louanna Raw  [address]  [place]  [place] [address]  Fax: [phone]

## 2010-10-25 ENCOUNTER — Telehealth: Payer: Self-pay | Admitting: Pulmonary Disease

## 2010-10-25 NOTE — Telephone Encounter (Signed)
Pt has been on outpt IV zyvox with mid-line PICC through [facility].  Informed by home nurse that patient discarded 3 doses of medication without infusing them.  Nurse not sure which doses she missed.  Nurse concerned that patient may need additional antibiotics if her respiratory symptoms do not resolve due to missed dose of antibiotics.  Also informed by nurse that insurance will not likely cover additional refill of zyvox since it was patient error for missing doses.  Advised nurse to leave PICC line in place for now, and have patient call back or go to ER if her respiratory symptoms get worse.  Will forward to [name] to address timing of PICC line removal further.

## 2010-10-27 ENCOUNTER — Telehealth: Payer: Self-pay | Admitting: Critical Care Medicine

## 2010-10-27 NOTE — Telephone Encounter (Signed)
Called, spoke with pt.  States her picc line has not been flushed since either Saturday or Sunday.  She has saline and heparin flushes at home and usually does this after each dose of zyvox per pt.  Pt has a pending appt tomorrow with TP -- she would like to know if she should flush this today or not considering it has not been done today.  I advised pt to go ahead and flush this with saline and then heparin like she usually does after each dose of zyvox once today until appt tomorrow with TP.  She verbalized understanding of this and ok with this plan.  She is aware picc line will be addressed tomorrow at [facility] whether or not she needs to keep it or not.  She is ok with this.

## 2010-10-27 NOTE — Telephone Encounter (Signed)
Spoke with the patient and advised that picc line needs to be left in for now and she needs to  Have an appt this week with TP to eval further abx needs. Pt states she does not know when her ride can bring her so she states she will ask her and call us back to set appt. Carron Curie, CMA

## 2010-10-27 NOTE — Telephone Encounter (Signed)
Leave picc in place Pt will need OV this week with [facility] to address need for further Zyvox

## 2010-10-28 ENCOUNTER — Ambulatory Visit: Payer: Medicare Other | Admitting: Adult Health

## 2010-10-28 ENCOUNTER — Telehealth: Payer: Self-pay | Admitting: Adult Health

## 2010-10-28 MED ORDER — PROMETHAZINE HCL 25 MG RE SUPP: 25.0000 mg | Freq: Four times a day (QID) | RECTAL | Status: DC | PRN

## 2010-10-28 NOTE — Telephone Encounter (Signed)
Spoke with pt and notified of recs per TP.  She verbalized understanding. She is still requesting that we send in "nausea pill" and is crying on the phone stating that she can not hold anything down at all and feels so bad she can not stand. I advised that she needs to go to ED if this ill. She then stated that she can not do this b.c she fears vomiting in the car. She is insisting that we call her in something for nausea. Pls advise thanks!

## 2010-10-28 NOTE — Telephone Encounter (Signed)
That is fine - she can have promethazine 25mg  suppository #10 1 every 6 hr As needed  N/v.  She sounds very sick , needs to go to ER or call 911 to go to ER She is at high risk for dehydration If she is dizzy then she is probably volume depleted.  Please contact office for sooner follow up if symptoms do not improve or worsen or seek emergency care

## 2010-10-28 NOTE — Telephone Encounter (Signed)
Spoke with Brooke [name].  She states had ov with TP today but had to cancel due to GI issues she has been having since last night. She is c/o watery diarrhea, nausea and vomiting since last night. She states this am temp was 99.7. I advised that she call PCP but she states that Dr Earlene Plater has retired and she does not have anyone else that she sees besides PW.  Brooke [name] states that she has tried immodium without any relief. She is requesting meds called in for some relief. I advised will ask TP about this and call her back, and in the meantime try to increase clear liquids as tolerated. Brooke [name] verbalized understanding. Pls advise thanks Allergies  Allergen Reactions  . Levofloxacin   . Mometasone Furoate     REACTION: swollen throat, thrush  . Penicillins   . Sulfonamide Derivatives

## 2010-10-28 NOTE — Telephone Encounter (Signed)
REC : BRAT diet  Push fluids  Avoid lactose for 5 days Pt was just on biaxin couple of weeks ago, if diarrhea does not stop Will need to be seen for ov.  Advance clear liquid diet as tolerated.  Please contact office for sooner follow up if symptoms do not improve or worsen or seek emergency care

## 2010-10-28 NOTE — Telephone Encounter (Signed)
Spoke with pt and notified that we can call in med for her but prefer that she call 911 or get someone to take her to the ED due to her probably being dehydrated. She verbalized understanding and states that she will take this into consideration. Will forward to PW so he is aware.

## 2010-10-29 NOTE — Telephone Encounter (Signed)
Per pt she is not sure when she can come for an OV. I asked the pt how she was feeling today and she states her nausea and diarrhea have subsided. She does says she is still weak and has not gotten out of bed today. She confirmed she is drinking plenty of fluids to stay hydrated but not eating much. Pt will call back to schedule an appt if symptoms return or call 911 for emergency help. She will keep OV with PW on 11/07/2010. Will forward to PW so he is aware.

## 2010-10-29 NOTE — Telephone Encounter (Signed)
Called, spoke with pt.  States she did not go to the ED but states "the nausea med worked really well."  Advised PW would like her to come in for OV but pt states she can't drive so she will need to get a ride.  States she will call to see if she can get a ride today and will call back to let us know.  Will await pt's call.

## 2010-10-29 NOTE — Telephone Encounter (Signed)
Noted  

## 2010-10-29 NOTE — Telephone Encounter (Signed)
Call pt this am, if she has not gone to ED, see if she can come to office for OV

## 2010-10-29 NOTE — Telephone Encounter (Signed)
Pt returned call. States she cannot come in this morning at 10:40 for 11am appt with Dr. Bard Herbert bc she does not have transportation.  I offered appt with TP at 11:30am this morning but pt declined stating she does not want to see Tammy and will not see Tammy.  Pt was offered OV tomorrow in [place] with [name] but pt declined this stating she can barely get to [facility] - there is no way she can come out to [place].  I then offered her an appt with [facility] for tomorrow at 12 pm as this is soonest appt open -- pt stated she would have to check to see if she will have transportation and call us back.  I advised pt I could not promise this slot would be still available and if this didn't work to check with her transportation for available dates and times she could come in so we can try to get something worked out.  She verbalized understanding of this and will call back.

## 2010-10-29 NOTE — Telephone Encounter (Signed)
PATIENT WANTS CRYSTAL TO KNOW THAT SHE HAS FRIEND WHO MAY BE ABLE TO BRING HER TO APPOINTMENT TOMORROW.  SHE WILL CALL BACK WHEN SHE IS CERTAIN.

## 2010-10-30 NOTE — Telephone Encounter (Signed)
Patient phoned stated that she left message last night with answering service to advise she will not be coming in today but will just keep her 11/07/10 visit with Dr Delford Field. She can be reached at [phone].Vedia Coffer

## 2010-11-04 ENCOUNTER — Telehealth: Payer: Self-pay | Admitting: Critical Care Medicine

## 2010-11-04 NOTE — Telephone Encounter (Signed)
The answer is YES

## 2010-11-04 NOTE — Telephone Encounter (Signed)
Spoke with Brooke [name]. She states that she just saw the pt and her arm is very swollen and slightly discolored, not warm to touch where the picc line is. Wants to know if they can go ahead and take it out. Nurse thinks that PW may want to put her on more abx when she is seen on Friday, but thinks that maybe the picc line should be placed in her other arm.  Will forward msg to TP to address since PW out of the office. Please advise thanks!

## 2010-11-04 NOTE — Telephone Encounter (Signed)
Spoke with Amy and notified of recs per TP. She verbalized understanding and states will inform the pt.

## 2010-11-04 NOTE — Telephone Encounter (Signed)
Spoke with Dewayne Hatch with [facility]. She states that pt has appt with PW on 11/07/10- wants to know if they needs to keep her picc line in place until then. Pls advise thanks

## 2010-11-04 NOTE — Telephone Encounter (Signed)
See other phone note dated 11/04/10

## 2010-11-04 NOTE — Telephone Encounter (Signed)
Yes, may d/c  Watch for sign of infection  Elevate arm, if swelling does not improve will need ov w/ PCP or [name]   Please contact office for sooner follow up if symptoms do not improve or worsen or seek emergency care  follow up as planned [name]  And As needed

## 2010-11-07 ENCOUNTER — Ambulatory Visit (INDEPENDENT_AMBULATORY_CARE_PROVIDER_SITE_OTHER): Payer: Medicare Other | Admitting: Critical Care Medicine

## 2010-11-07 ENCOUNTER — Encounter: Payer: Self-pay | Admitting: Critical Care Medicine

## 2010-11-07 VITALS — BP 90/50 | HR 79 | Temp 98.2°F | Ht 65.0 in | Wt 111.8 lb

## 2010-11-07 DIAGNOSIS — R110 Nausea: Secondary | ICD-10-CM

## 2010-11-07 DIAGNOSIS — R079 Chest pain, unspecified: Secondary | ICD-10-CM

## 2010-11-07 DIAGNOSIS — J479 Bronchiectasis, uncomplicated: Secondary | ICD-10-CM

## 2010-11-07 MED ORDER — HYDROCODONE-ACETAMINOPHEN 10-325 MG PO TABS: ORAL_TABLET | ORAL | Status: DC

## 2010-11-07 MED ORDER — PROMETHAZINE HCL 25 MG RE SUPP: 25.0000 mg | Freq: Four times a day (QID) | RECTAL | Status: DC | PRN

## 2010-11-07 MED ORDER — DOXYCYCLINE HYCLATE 100 MG PO TBEC: 100.0000 mg | DELAYED_RELEASE_TABLET | Freq: Two times a day (BID) | ORAL | Status: AC

## 2010-11-07 MED ORDER — HYDROCODONE-HOMATROPINE 5-1.5 MG/5ML PO SYRP
5.0000 mL | ORAL_SOLUTION | Freq: Four times a day (QID) | ORAL | Status: AC | PRN
Start: 2010-11-07 — End: 2010-11-17

## 2010-11-07 NOTE — Progress Notes (Signed)
Subjective:    Patient ID: Brooke [name], female    DOB: [birth year], 70 y.o.   MRN: [id number]  HPI 70 y.o..WF with Bronchiectasis. MRSA colonization  11/07/2010 Took 6days of zyvox and unchanged.  Pt hurts all over. Still coughing up thick green mucus.  Now L arm swollen. Pt had nausea and vomiting.  Still has some nausea.   Notes  chest pain , notes ongoing excess mucus.    Past Medical History  Diagnosis Date  . Hypothyroidism   . History of allergy   . Mycobacterium avium complex   . Bronchiectasis     colonized mrsa, s/p vancomyin IV 10 days 5/10 and 7 days 6/10     No family history on file.   History   Social History  . Marital Status: Widowed    Spouse Name: N/A    Number of Children: N/A  . Years of Education: N/A   Occupational History  . Not on file.   Social History Main Topics  . Smoking status: Former Smoker -- 1.0 packs/day for 3 years    Types: Cigarettes    Quit date: 06/08/1962  . Smokeless tobacco: Never Used  . Alcohol Use: Not on file  . Drug Use: Not on file  . Sexually Active: Not on file   Other Topics Concern  . Not on file   Social History Narrative  . No narrative on file     Allergies  Allergen Reactions  . Levofloxacin   . Mometasone Furoate     REACTION: swollen throat, thrush  . Penicillins   . Sulfonamide Derivatives      Outpatient Prescriptions Prior to Visit  Medication Sig Dispense Refill  . albuterol (PROAIR HFA) 108 (90 BASE) MCG/ACT inhaler Inhale 2 puffs into the lungs every 6 (six) hours as needed for wheezing.  1 Inhaler  6  . ALPRAZolam (XANAX) 0.5 MG tablet Take 1 tablet (0.5 mg total) by mouth 3 (three) times daily as needed for anxiety.  90 tablet  0  . CENESTIN 0.625 MG tablet Once daily      . citalopram (CELEXA) 20 MG tablet Once daily      . levothyroxine (SYNTHROID, LEVOTHROID) 100 MCG tablet 1/2 tablet once daily      . triamterene-hydrochlorothiazide (MAXZIDE) 75-50 MG per tablet 1/2 tablet once daily       . HYDROcodone-acetaminophen (NORCO) 10-325 MG per tablet 1 - 2 by mouth Every 4 hours as needed  60 tablet  0      Review of Systems Constitutional:   No  weight loss, night sweats,  Fevers, chills, fatigue, lassitude. HEENT:   No headaches,  Difficulty swallowing,  Tooth/dental problems,  Sore throat,                No sneezing, itching, ear ache, nasal congestion, post nasal drip,   CV:  Notes  chest pain,  Orthopnea, PND, swelling in lower extremities, anasarca, dizziness, palpitations  GI  No heartburn, indigestion, abdominal pain, nausea, vomiting, diarrhea, change in bowel habits, loss of appetite  Resp: Notes  shortness of breath with exertion and  at rest.  Notes  excess mucus, notes  productive cough,  No non-productive cough,  No coughing up of blood.  No change in color of mucus.  No wheezing.  No chest wall deformity  Skin: no rash or lesions.  GU: no dysuria, change in color of urine, no urgency or frequency.  No flank pain.  MS:  No  joint pain or swelling.  No decreased range of motion.  No back pain.  Psych:  No change in mood or affect. No depression or anxiety.  No memory loss.     Objective:   Physical Exam Filed Vitals:   11/07/10 1454  BP: 90/50  Pulse: 79  Temp: 98.2 F (36.8 C)  TempSrc: Oral  Height: 5\' 5"  (1.651 m)  Weight: 111 lb 12.8 oz (50.712 kg)  SpO2: 91%    Gen: Pleasant, well-nourished, in no distress,  normal affect  ENT: No lesions,  mouth clear,  oropharynx clear, no postnasal drip  Neck: No JVD, no TMG, no carotid bruits  Lungs: No use of accessory muscles, no dullness to percussion, clear without rales or rhonchi  Cardiovascular: RRR, heart sounds normal, no murmur or gallops, no peripheral edema  Abdomen: soft and NT, no HSM,  BS normal  Musculoskeletal: No deformities, no cyanosis or clubbing  Neuro: alert, non focal  Skin: Warm, no lesions or rashes,  Site of picc line not infected, edema around elbow is fluid should  resolve        Assessment & Plan:   BRONCHIECTASIS Bronchiectasis flare with MRSA  S/p 6days of IV zyvox in the home.  PICC line infiltrated and d/c'd Plan Doxycycline 100mg  bid x 10days No further IV ABX Refilled norco and cough med Cannot yet clear for cataract surgery     Updated Medication List Outpatient Encounter Prescriptions as of 11/07/2010  Medication Sig Dispense Refill  . albuterol (PROAIR HFA) 108 (90 BASE) MCG/ACT inhaler Inhale 2 puffs into the lungs every 6 (six) hours as needed for wheezing.  1 Inhaler  6  . ALPRAZolam (XANAX) 0.5 MG tablet Take 1 tablet (0.5 mg total) by mouth 3 (three) times daily as needed for anxiety.  90 tablet  0  . CENESTIN 0.625 MG tablet Once daily      . citalopram (CELEXA) 20 MG tablet Once daily      . HYDROcodone-acetaminophen (NORCO) 10-325 MG per tablet 1 - 2 by mouth Every 4 hours as needed  60 tablet  4  . levothyroxine (SYNTHROID, LEVOTHROID) 100 MCG tablet 1/2 tablet once daily      . triamterene-hydrochlorothiazide (MAXZIDE) 75-50 MG per tablet 1/2 tablet once daily      . DISCONTD: HYDROcodone-acetaminophen (NORCO) 10-325 MG per tablet 1 - 2 by mouth Every 4 hours as needed  60 tablet  0  . doxycycline (DORYX) 100 MG EC tablet Take 1 tablet (100 mg total) by mouth 2 (two) times daily.  20 tablet  0  . HYDROcodone-homatropine (HYCODAN) 5-1.5 MG/5ML syrup Take 5 mLs by mouth every 6 (six) hours as needed for cough.  180 mL  4  . promethazine (PHENERGAN) 25 MG suppository Place 1 suppository (25 mg total) rectally every 6 (six) hours as needed for nausea.  20 each  1

## 2010-11-07 NOTE — Patient Instructions (Signed)
Doxycyline one twice daily for 10days Cough syrup/pain med/nausea meds refilled Return 2 weeks to see tammy parrett Return 2 months for Dr Delford Field

## 2010-11-09 NOTE — Assessment & Plan Note (Signed)
Bronchiectasis flare with MRSA  S/p 6days of IV zyvox in the home.  PICC line infiltrated and d/c'd Plan Doxycycline 100mg  bid x 10days No further IV ABX Refilled norco and cough med Cannot yet clear for cataract surgery

## 2010-11-12 ENCOUNTER — Telehealth: Payer: Self-pay | Admitting: Critical Care Medicine

## 2010-11-12 NOTE — Telephone Encounter (Signed)
ATC -had to leave message for patient to call back.

## 2010-11-12 NOTE — Telephone Encounter (Signed)
Let pt know that she got 6days of IV xyvox and 5 days of doxycycline.  This should be adequate for her at this time.  Would stop the oral antibiotic and not take additional antibiotic at this time.  Give the abx's she has completed time.

## 2010-11-12 NOTE — Telephone Encounter (Signed)
Called and spoke with pt.  Pt last saw PW on 11/07/2010 and was given rx for Doxycycline.  Pt states ever since taking this med she has had nausea and vomiting and dry heaves.  Pt states she has tried taking with and without food and also with and without "a nausea suppository" States this doesn't help.  PW out of office today.  Will forward message to doc of the day to address. Allergies  Allergen Reactions  . Doxycycline     vomiting  . Levofloxacin   . Mometasone Furoate     REACTION: swollen throat, thrush  . Penicillins   . Sulfonamide Derivatives

## 2010-11-12 NOTE — Telephone Encounter (Signed)
Called, spoke with pt. She was informed of KC's statement below and aware no aditional abx needed at this time - give the abx's she has completed time to work per [employer].  Pt verbalized understanding of this.  She is aware to call back if sxs do not improve or worsen or go to the ED if it is after hours.  She verbalized understanding of this.

## 2010-11-13 NOTE — Telephone Encounter (Signed)
Called, spoke with pt.  She is aware PW agrees with KC's recs -- she does not need any further abx at this time.  She verbalized understanding of this and voiced no further questions or concerns at this time.

## 2010-11-13 NOTE — Telephone Encounter (Signed)
i agree with KC rec.  She needs an antibiotic holiday at this time

## 2010-11-20 ENCOUNTER — Inpatient Hospital Stay (INDEPENDENT_AMBULATORY_CARE_PROVIDER_SITE_OTHER)
Admission: RE | Admit: 2010-11-20 | Discharge: 2010-11-20 | Disposition: A | Discharge status: Home / Self Care | Payer: Medicare Other | Source: Ambulatory Visit | Attending: Family Medicine | Admitting: Family Medicine

## 2010-11-20 DIAGNOSIS — R112 Nausea with vomiting, unspecified: Secondary | ICD-10-CM

## 2010-11-20 DIAGNOSIS — E860 Dehydration: Secondary | ICD-10-CM

## 2010-11-21 ENCOUNTER — Other Ambulatory Visit: Payer: Self-pay | Admitting: *Deleted

## 2010-11-21 MED ORDER — ALPRAZOLAM 0.5 MG PO TABS: 0.5000 mg | ORAL_TABLET | Freq: Three times a day (TID) | ORAL | Status: DC | PRN

## 2010-11-21 NOTE — Telephone Encounter (Signed)
Faxed refill request received from [facility] for Alprazolam 0.5 mg, # 90, 1 po 3 times daily prn for anxiety. Last filled by PW on 09/23/2010. Pls advise on sending refill.

## 2010-11-21 NOTE — Telephone Encounter (Signed)
Rx refill called to pharm okay x 1 only.

## 2010-11-21 NOTE — Telephone Encounter (Signed)
Ok to refill 

## 2010-11-24 ENCOUNTER — Ambulatory Visit: Payer: Medicare Other | Admitting: Adult Health

## 2010-12-11 ENCOUNTER — Telehealth: Payer: Self-pay | Admitting: Critical Care Medicine

## 2010-12-11 NOTE — Telephone Encounter (Signed)
[email or web address].Davine Coba, CMA  

## 2010-12-12 NOTE — Telephone Encounter (Signed)
Spoke with the pt and she states that she only coughed up the green phlegm once and has not done it since. She states she feels fine and does not need anything for this at this time. She then states she is scheduled for eye surgery on 12-3010 with Dr. Darel Hong and is needing clearance from [name] for this. Clearance needs to be faxed to [phone]. I advised I will forward message to [name]. Carron Curie, CMA

## 2010-12-12 NOTE — Telephone Encounter (Signed)
I have faxed this phone note to pt doctor. Carron Curie, CMA

## 2010-12-12 NOTE — Telephone Encounter (Signed)
She is now cleared for eye surgery

## 2010-12-12 NOTE — Telephone Encounter (Signed)
Pt returning call.Brooke [name] ° °

## 2011-01-02 ENCOUNTER — Other Ambulatory Visit: Payer: Self-pay | Admitting: *Deleted

## 2011-01-02 MED ORDER — ALPRAZOLAM 0.5 MG PO TABS: 0.5000 mg | ORAL_TABLET | Freq: Three times a day (TID) | ORAL | Status: DC | PRN

## 2011-01-05 ENCOUNTER — Telehealth: Payer: Self-pay | Admitting: Critical Care Medicine

## 2011-01-05 NOTE — Telephone Encounter (Signed)
i answered this ?    This pt is not a ID risk

## 2011-01-06 NOTE — Telephone Encounter (Signed)
PW, I just wanted to confirm that you have spoken with the office and I can sign off on note. Thanks.

## 2011-01-06 NOTE — Telephone Encounter (Signed)
Noted by triage - will sign off on message.

## 2011-01-06 NOTE — Telephone Encounter (Signed)
Yes I spoke to them

## 2011-03-18 ENCOUNTER — Ambulatory Visit: Payer: Medicare Other | Admitting: Internal Medicine

## 2011-03-20 ENCOUNTER — Encounter: Payer: Self-pay | Admitting: Critical Care Medicine

## 2011-03-20 ENCOUNTER — Ambulatory Visit (INDEPENDENT_AMBULATORY_CARE_PROVIDER_SITE_OTHER): Payer: Medicare Other | Admitting: Critical Care Medicine

## 2011-03-20 ENCOUNTER — Inpatient Hospital Stay (HOSPITAL_COMMUNITY)
Admission: EM | Admit: 2011-03-20 | Discharge: 2011-03-30 | DRG: 190 | Disposition: A | Discharge status: Home Health | Payer: Medicare Other | Attending: Pulmonary Disease | Admitting: Pulmonary Disease

## 2011-03-20 ENCOUNTER — Emergency Department (HOSPITAL_COMMUNITY): Payer: Medicare Other

## 2011-03-20 ENCOUNTER — Inpatient Hospital Stay: Admission: AD | Admit: 2011-03-20 | Payer: Self-pay | Source: Ambulatory Visit | Admitting: Pulmonary Disease

## 2011-03-20 VITALS — BP 90/62 | HR 94 | Temp 98.1°F | Ht 65.5 in | Wt 102.4 lb

## 2011-03-20 DIAGNOSIS — E46 Unspecified protein-calorie malnutrition: Secondary | ICD-10-CM | POA: Diagnosis present

## 2011-03-20 DIAGNOSIS — Z22322 Carrier or suspected carrier of Methicillin resistant Staphylococcus aureus: Secondary | ICD-10-CM

## 2011-03-20 DIAGNOSIS — J471 Bronchiectasis with (acute) exacerbation: Secondary | ICD-10-CM

## 2011-03-20 DIAGNOSIS — J9620 Acute and chronic respiratory failure, unspecified whether with hypoxia or hypercapnia: Secondary | ICD-10-CM | POA: Diagnosis present

## 2011-03-20 DIAGNOSIS — R059 Cough, unspecified: Secondary | ICD-10-CM | POA: Diagnosis present

## 2011-03-20 DIAGNOSIS — R05 Cough: Secondary | ICD-10-CM | POA: Diagnosis present

## 2011-03-20 DIAGNOSIS — R197 Diarrhea, unspecified: Secondary | ICD-10-CM | POA: Diagnosis not present

## 2011-03-20 DIAGNOSIS — E876 Hypokalemia: Secondary | ICD-10-CM | POA: Diagnosis present

## 2011-03-20 DIAGNOSIS — G8929 Other chronic pain: Secondary | ICD-10-CM | POA: Diagnosis present

## 2011-03-20 DIAGNOSIS — J479 Bronchiectasis, uncomplicated: Secondary | ICD-10-CM

## 2011-03-20 DIAGNOSIS — R627 Adult failure to thrive: Secondary | ICD-10-CM | POA: Diagnosis present

## 2011-03-20 DIAGNOSIS — F411 Generalized anxiety disorder: Secondary | ICD-10-CM | POA: Diagnosis present

## 2011-03-20 LAB — DIFFERENTIAL
Eosinophils Relative: 6 % — ABNORMAL HIGH (ref 0–5)
Lymphocytes Relative: 27 % (ref 12–46)
Monocytes Absolute: 0.8 10*3/uL (ref 0.1–1.0)
Monocytes Relative: 10 % (ref 3–12)
Neutro Abs: 4.5 10*3/uL (ref 1.7–7.7)

## 2011-03-20 LAB — COMPREHENSIVE METABOLIC PANEL
ALT: 7 U/L (ref 0–35)
Alkaline Phosphatase: 114 U/L (ref 39–117)
BUN: 12 mg/dL (ref 6–23)
CO2: 28 mEq/L (ref 19–32)
Chloride: 96 mEq/L (ref 96–112)
GFR calc Af Amer: 70 mL/min — ABNORMAL LOW (ref >90)
Glucose, Bld: 96 mg/dL (ref 70–99)
Potassium: 2.9 mEq/L — ABNORMAL LOW (ref 3.5–5.1)
Sodium: 134 mEq/L — ABNORMAL LOW (ref 135–145)
Total Bilirubin: 0.1 mg/dL — ABNORMAL LOW (ref 0.3–1.2)
Total Protein: 7.3 g/dL (ref 6.0–8.3)

## 2011-03-20 LAB — CBC
HCT: 32 % — ABNORMAL LOW (ref 36.0–46.0)
Hemoglobin: 11 g/dL — ABNORMAL LOW (ref 12.0–15.0)
RBC: 3.74 MIL/uL — ABNORMAL LOW (ref 3.87–5.11)
WBC: 8 10*3/uL (ref 4.0–10.5)

## 2011-03-20 LAB — TSH: TSH: 3.637 u[IU]/mL (ref 0.350–4.500)

## 2011-03-20 NOTE — Progress Notes (Signed)
Subjective:    Patient ID: Brooke [name], female    DOB: 05/07/1941, 70 y.o.   MRN: [id number]  HPI  70 y.o..WF with Bronchiectasis. MRSA colonization   03/20/2011  Notes more cough, productive yellow,  now is green.  No real chest pain.  Has to hold ribs if coughs.  Notes more severe rubbing pain.  Notes more dyspnea and weakness. Not able to eat.  Notes N/V and no appt.  ENT rx cefdinir.     Past Medical History  Diagnosis Date  . Hypothyroidism   . History of allergy   . Mycobacterium avium complex   . Bronchiectasis     colonized mrsa, s/p vancomyin IV 10 days 5/10 and 7 days 6/10     History reviewed. No pertinent family history.   History   Social History  . Marital Status: Widowed    Spouse Name: N/A    Number of Children: N/A  . Years of Education: N/A   Occupational History  . Not on file.   Social History Main Topics  . Smoking status: Former Smoker -- 1.0 packs/day for 3 years    Types: Cigarettes    Quit date: 06/08/1962  . Smokeless tobacco: Never Used  . Alcohol Use: Not on file  . Drug Use: Not on file  . Sexually Active: Not on file   Other Topics Concern  . Not on file   Social History Narrative  . No narrative on file     Allergies  Allergen Reactions  . Doxycycline     vomiting  . Levofloxacin   . Mometasone Furoate     REACTION: swollen throat, thrush  . Penicillins   . Sulfonamide Derivatives      Outpatient Prescriptions Prior to Visit  Medication Sig Dispense Refill  . albuterol (PROAIR HFA) 108 (90 BASE) MCG/ACT inhaler Inhale 2 puffs into the lungs every 6 (six) hours as needed for wheezing.  1 Inhaler  6  . ALPRAZolam (XANAX) 0.5 MG tablet Take 1 tablet (0.5 mg total) by mouth 3 (three) times daily as needed for anxiety.  90 tablet  0  . CENESTIN 0.625 MG tablet Once daily      . citalopram (CELEXA) 20 MG tablet Take 20 mg by mouth every other day.       Marland Kitchen HYDROcodone-acetaminophen (NORCO) 10-325 MG per tablet 1 - 2 by mouth  Every 4 hours as needed  60 tablet  4  . levothyroxine (SYNTHROID, LEVOTHROID) 100 MCG tablet 1/2 tablet once daily      . triamterene-hydrochlorothiazide (MAXZIDE) 75-50 MG per tablet 1/2 tablet once daily          Review of Systems  Constitutional:   No  weight loss, night sweats,  Fevers, chills, fatigue, lassitude. HEENT:   No headaches,  Difficulty swallowing,  Tooth/dental problems,  Sore throat,                No sneezing, itching, ear ache, nasal congestion, post nasal drip,   CV:  Notes  chest pain,  Orthopnea, PND, swelling in lower extremities, anasarca, dizziness, palpitations  GI  No heartburn, indigestion, abdominal pain, ++ nausea, +++ vomiting, diarrhea, change in bowel habits, loss of appetite  Resp: Notes  shortness of breath with exertion and  at rest.  Notes  excess mucus, notes  productive cough,  No non-productive cough,  No coughing up of blood.  Notes  change in color of mucus.  Notes  wheezing.  No chest  wall deformity  Skin: no rash or lesions.  GU: no dysuria, change in color of urine, no urgency or frequency.  No flank pain.  MS:  No joint pain or swelling.  No decreased range of motion.  No back pain.  Psych:  No change in mood or affect. No depression or anxiety.  No memory loss.     Objective:   Physical Exam  Filed Vitals:   03/20/11 1052  BP: 90/62  Pulse: 94  Temp: 98.1 F (36.7 C)  TempSrc: Oral  Height: 5' 5.5" (1.664 m)  Weight: 102 lb 6.4 oz (46.448 kg)  SpO2: 94%    Gen: Pleasant, well-nourished, in no distress,  normal affect  ENT: No lesions,  mouth clear,  oropharynx clear, no postnasal drip  Neck: No JVD, no TMG, no carotid bruits  Lungs: No use of accessory muscles, no dullness to percussion, bilateral rhonchi , poor airflow  Cardiovascular: RRR, heart sounds normal, no murmur or gallops, no peripheral edema  Abdomen: soft and NT, no HSM,  BS normal  Musculoskeletal: No deformities, no cyanosis or clubbing  Neuro:  alert, non focal  Skin: Warm, no lesions or rashes,  Site of picc line not infected, edema around elbow is fluid should resolve        Assessment & Plan:   BRONCHIECTASIS Acute bronchiectasis flare and prob LLL PNA Plan Admit for IV ABX See orders      Updated Medication List Outpatient Encounter Prescriptions as of 03/20/2011  Medication Sig Dispense Refill  . albuterol (PROAIR HFA) 108 (90 BASE) MCG/ACT inhaler Inhale 2 puffs into the lungs every 6 (six) hours as needed for wheezing.  1 Inhaler  6  . ALPRAZolam (XANAX) 0.5 MG tablet Take 1 tablet (0.5 mg total) by mouth 3 (three) times daily as needed for anxiety.  90 tablet  0  . cefdinir (OMNICEF) 300 MG capsule Take 300 mg by mouth 2 (two) times daily.        . CENESTIN 0.625 MG tablet Once daily      . citalopram (CELEXA) 20 MG tablet Take 20 mg by mouth every other day.       Marland Kitchen HYDROcodone-acetaminophen (NORCO) 10-325 MG per tablet 1 - 2 by mouth Every 4 hours as needed  60 tablet  4  . levothyroxine (SYNTHROID, LEVOTHROID) 100 MCG tablet 1/2 tablet once daily      . triamterene-hydrochlorothiazide (MAXZIDE) 75-50 MG per tablet 1/2 tablet once daily

## 2011-03-20 NOTE — Assessment & Plan Note (Signed)
Acute bronchiectasis flare and prob LLL PNA Plan Admit for IV ABX See orders

## 2011-03-21 ENCOUNTER — Inpatient Hospital Stay (HOSPITAL_COMMUNITY): Payer: Medicare Other

## 2011-03-21 LAB — BASIC METABOLIC PANEL
BUN: 9 mg/dL (ref 6–23)
Calcium: 8.9 mg/dL (ref 8.4–10.5)
Chloride: 98 mEq/L (ref 96–112)
Creatinine, Ser: 0.77 mg/dL (ref 0.50–1.10)
GFR calc Af Amer: >90 mL/min (ref >90)

## 2011-03-21 LAB — GRAM STAIN

## 2011-03-22 LAB — BASIC METABOLIC PANEL
CO2: 27 mEq/L (ref 19–32)
Chloride: 105 mEq/L (ref 96–112)
Creatinine, Ser: 0.61 mg/dL (ref 0.50–1.10)
GFR calc Af Amer: >90 mL/min (ref >90)
Glucose, Bld: 90 mg/dL (ref 70–99)
Sodium: 138 mEq/L (ref 135–145)

## 2011-03-23 ENCOUNTER — Telehealth: Payer: Self-pay | Admitting: Critical Care Medicine

## 2011-03-23 DIAGNOSIS — J471 Bronchiectasis with (acute) exacerbation: Secondary | ICD-10-CM

## 2011-03-23 LAB — BASIC METABOLIC PANEL
Calcium: 8.3 mg/dL — ABNORMAL LOW (ref 8.4–10.5)
Chloride: 107 mEq/L (ref 96–112)
Creatinine, Ser: 0.62 mg/dL (ref 0.50–1.10)
GFR calc Af Amer: >90 mL/min (ref >90)
GFR calc non Af Amer: 88 mL/min — ABNORMAL LOW (ref >90)

## 2011-03-23 NOTE — Telephone Encounter (Signed)
DAUGHTER CALLED BACK AGAIN. SAYS SHE HAS "A FEW MORE QUESTIONS FOR NURSE". SAME PH#. [name]

## 2011-03-23 NOTE — Telephone Encounter (Signed)
I spoke with pt daughter and she wants to make sure pt does not have mrsa. Pt daughter also states she is going to be taking pt to her home and he husband is currently on immuno suppressant drugs for kidney transplant adn wants to make sure he will be okay. Daughter states he has not been around her during this whole ordeal. Please advise [name], thanks  Carver Fila, CMA

## 2011-03-23 NOTE — Telephone Encounter (Signed)
Daughter calling back and states pt will be released tomorrow and needs an answer by then. Please advise [name], thanks  Carver Fila, CMA

## 2011-03-24 ENCOUNTER — Telehealth: Payer: Self-pay | Admitting: Critical Care Medicine

## 2011-03-24 LAB — CBC
HCT: 28.6 % — ABNORMAL LOW (ref 36.0–46.0)
Platelets: 337 10*3/uL (ref 150–400)
RDW: 14.2 % (ref 11.5–15.5)
WBC: 8.1 10*3/uL (ref 4.0–10.5)

## 2011-03-24 LAB — BASIC METABOLIC PANEL
Chloride: 106 mEq/L (ref 96–112)
GFR calc Af Amer: >90 mL/min (ref >90)
GFR calc non Af Amer: 85 mL/min — ABNORMAL LOW (ref >90)
Potassium: 3.8 mEq/L (ref 3.5–5.1)
Sodium: 140 mEq/L (ref 135–145)

## 2011-03-24 NOTE — Telephone Encounter (Signed)
Spoke with patient's daughter, she is aware that patient is not contagious.

## 2011-03-24 NOTE — Telephone Encounter (Signed)
The pt is NOT contagious

## 2011-03-24 NOTE — Telephone Encounter (Signed)
Per Mayra Reel w/ [facility], [name] has ordered Zyvox for pt and it will need a PA beofre they can provide this for her. Pt will be going home from the hospital today and PA is needed ASAP. Called [facility] at [phone]  Medication needed:  Zyvox 600 mg, 1 po bid for 5 days, dx:  Bronchiectasis  APPROVAL # [id number] Medication must be ordered from the [facility] before 03/30/11. Mayra Reel is aware.

## 2011-03-25 ENCOUNTER — Telehealth: Payer: Self-pay | Admitting: Critical Care Medicine

## 2011-03-25 LAB — CLOSTRIDIUM DIFFICILE BY PCR: Toxigenic C. Difficile by PCR: NEGATIVE

## 2011-03-25 NOTE — Telephone Encounter (Signed)
ATC x 1. [employer]. WCB.

## 2011-03-25 NOTE — Telephone Encounter (Signed)
Spoke with Vickie, pt's daughter. She states that she does not think that pt is ready to be d/c'ed today. She states that she has not received "all of her treatments" and thinks that it makes more sense to leave the pt there for the day and let her be sent home tomorrow. She states that pt has no means of transportation today if d/c'ed today. PW pls advise thanks

## 2011-03-25 NOTE — Telephone Encounter (Signed)
ATC pt at the number provided above - line busy x 3 - wcb

## 2011-03-25 NOTE — Telephone Encounter (Signed)
Vickie, pt's daughter, stated that she believes her mother should stay in the hospital until she has gotten all of her treatments.  Vickie requests to be called back at [phone].  [name]

## 2011-03-25 NOTE — Telephone Encounter (Signed)
Brooke [name] has called back.  Stated she will complete her treatments at the hospital & will wait to be released.  [name]

## 2011-03-25 NOTE — Telephone Encounter (Signed)
Call pt or daughter and tell them we will have to leave pt in hospital until Friday AM to complete RX

## 2011-03-25 NOTE — Telephone Encounter (Signed)
Pt calling to let [name] know she is still in the hospital. She says Cindee Lame has not been by to discharge her and she does not have a ride home this afternoon. I have asked that she speak with the nurse and see if she knows anything about her getting discharged today. I will forward this msg to [name], at the pt's request, so he is aware.

## 2011-03-26 ENCOUNTER — Telehealth: Payer: Self-pay | Admitting: Critical Care Medicine

## 2011-03-26 DIAGNOSIS — J471 Bronchiectasis with (acute) exacerbation: Secondary | ICD-10-CM

## 2011-03-26 LAB — EXPECTORATED SPUTUM ASSESSMENT W GRAM STAIN, RFLX TO RESP C

## 2011-03-26 NOTE — Telephone Encounter (Signed)
Pt is currently in hospital at [facility].  See other phone note

## 2011-03-26 NOTE — Telephone Encounter (Signed)
I spoke with pt and she states she would like [name] let her nurse know that she can have something for nausea. Pt states the nurse at the hospital advised her they could not give her anything for nausea bc they have no order for it and will not order it until [name] gives them an order. Pt states she has been so sick and has been vomiting constantly. Pt states she can't keep any food down bc she is so sick. Pt states she only trusts [name] to order this for her. Please advise [name], thanks  Allergies  Allergen Reactions  . Doxycycline     vomiting  . Levofloxacin   . Mometasone Furoate     REACTION: swollen throat, thrush  . Penicillins   . Sulfonamide Derivatives      Carver Fila, CMA

## 2011-03-26 NOTE — Telephone Encounter (Signed)
Call the RN on the floor to call in phenegren 25mg  po every 4hours prn nausea She is in room 1314 at [facility]

## 2011-03-26 NOTE — Telephone Encounter (Signed)
Attempted to call pt but line is busy. 

## 2011-03-26 NOTE — Telephone Encounter (Signed)
Called and spoke with the RN on the floor and gave the order for phenergan 25mg    By mouth every 4 hours prn for nausea.

## 2011-03-27 LAB — DIFFERENTIAL
Eosinophils Absolute: 0.3 10*3/uL (ref 0.0–0.7)
Eosinophils Relative: 3 % (ref 0–5)
Lymphocytes Relative: 37 % (ref 12–46)
Lymphs Abs: 3.7 10*3/uL (ref 0.7–4.0)
Monocytes Relative: 9 % (ref 3–12)

## 2011-03-27 LAB — CBC
HCT: 27.4 % — ABNORMAL LOW (ref 36.0–46.0)
MCH: 29.9 pg (ref 26.0–34.0)
MCV: 88.1 fL (ref 78.0–100.0)
Platelets: 289 10*3/uL (ref 150–400)
RBC: 3.11 MIL/uL — ABNORMAL LOW (ref 3.87–5.11)
RDW: 14.2 % (ref 11.5–15.5)

## 2011-03-27 LAB — SEDIMENTATION RATE: Sed Rate: 79 mm/hr — ABNORMAL HIGH (ref 0–22)

## 2011-03-27 LAB — CK TOTAL AND CKMB (NOT AT ARMC): Relative Index: 4.1 — ABNORMAL HIGH (ref 0.0–2.5)

## 2011-03-27 LAB — BASIC METABOLIC PANEL
CO2: 26 mEq/L (ref 19–32)
Chloride: 103 mEq/L (ref 96–112)
Creatinine, Ser: 0.84 mg/dL (ref 0.50–1.10)
GFR calc Af Amer: 79 mL/min — ABNORMAL LOW (ref >90)
Potassium: 3.9 mEq/L (ref 3.5–5.1)

## 2011-03-27 LAB — MAGNESIUM: Magnesium: 1.3 mg/dL — ABNORMAL LOW (ref 1.5–2.5)

## 2011-03-27 LAB — PRO B NATRIURETIC PEPTIDE: Pro B Natriuretic peptide (BNP): 880.3 pg/mL — ABNORMAL HIGH (ref 0–125)

## 2011-03-27 LAB — TROPONIN I: Troponin I: <0.3 ng/mL (ref <0.30)

## 2011-03-28 DIAGNOSIS — F411 Generalized anxiety disorder: Secondary | ICD-10-CM

## 2011-03-28 DIAGNOSIS — R197 Diarrhea, unspecified: Secondary | ICD-10-CM

## 2011-03-28 DIAGNOSIS — J15212 Pneumonia due to Methicillin resistant Staphylococcus aureus: Secondary | ICD-10-CM

## 2011-03-29 DIAGNOSIS — J471 Bronchiectasis with (acute) exacerbation: Secondary | ICD-10-CM

## 2011-03-29 DIAGNOSIS — R197 Diarrhea, unspecified: Secondary | ICD-10-CM

## 2011-03-29 DIAGNOSIS — F411 Generalized anxiety disorder: Secondary | ICD-10-CM

## 2011-03-29 DIAGNOSIS — J15212 Pneumonia due to Methicillin resistant Staphylococcus aureus: Secondary | ICD-10-CM

## 2011-03-29 LAB — CLOSTRIDIUM DIFFICILE BY PCR: Toxigenic C. Difficile by PCR: NEGATIVE

## 2011-03-30 ENCOUNTER — Other Ambulatory Visit: Payer: Self-pay | Admitting: Critical Care Medicine

## 2011-03-30 ENCOUNTER — Telehealth: Payer: Self-pay | Admitting: Critical Care Medicine

## 2011-03-30 DIAGNOSIS — D649 Anemia, unspecified: Secondary | ICD-10-CM

## 2011-03-30 DIAGNOSIS — J449 Chronic obstructive pulmonary disease, unspecified: Secondary | ICD-10-CM

## 2011-03-30 LAB — CBC
Hemoglobin: 9.1 g/dL — ABNORMAL LOW (ref 12.0–15.0)
MCH: 29.4 pg (ref 26.0–34.0)
MCHC: 33 g/dL (ref 30.0–36.0)
Platelets: 254 10*3/uL (ref 150–400)
RDW: 14 % (ref 11.5–15.5)

## 2011-03-30 LAB — COMPREHENSIVE METABOLIC PANEL
ALT: 10 U/L (ref 0–35)
AST: 19 U/L (ref 0–37)
Albumin: 2.5 g/dL — ABNORMAL LOW (ref 3.5–5.2)
Alkaline Phosphatase: 70 U/L (ref 39–117)
Calcium: 9.4 mg/dL (ref 8.4–10.5)
GFR calc Af Amer: >90 mL/min (ref >90)
Potassium: 4.5 mEq/L (ref 3.5–5.1)
Sodium: 139 mEq/L (ref 135–145)
Total Protein: 6.1 g/dL (ref 6.0–8.3)

## 2011-03-30 LAB — SEDIMENTATION RATE: Sed Rate: 96 mm/hr — ABNORMAL HIGH (ref 0–22)

## 2011-03-30 MED ORDER — POLYSACCHARIDE IRON 150 MG PO CAPS: 150.0000 mg | ORAL_CAPSULE | Freq: Every day | ORAL | Status: DC

## 2011-03-30 NOTE — Telephone Encounter (Signed)
Spoke with pt. She states that she will not need home health aid and would like this cancelled. She is unsure which home care company is to come to her home, but that her daughter who is a CNA will be taking care of her. Will forward to PW so that he is aware.

## 2011-03-30 NOTE — Telephone Encounter (Signed)
Noted  Call [facility] to cancel

## 2011-03-30 NOTE — Telephone Encounter (Signed)
Order was sent to [facility] to cancel home health through [facility].

## 2011-03-31 ENCOUNTER — Telehealth: Payer: Self-pay | Admitting: Critical Care Medicine

## 2011-03-31 NOTE — Telephone Encounter (Signed)
Called, spoke with pt's daughter, Lynden Ang.  States pt's "mental capacity" was diminishing while in the hospital.  States she was discharged yesterday but is still confused today, having difficulty keeping conversations, and not eating much.  She is concerned because this is still going on,and pt has been discharged from the hospital.  She would like to know if this could be related to the abx pt received in the hospital.  Also, if the infection has cleared up because she didn't get a chance to speak with a dr prior to pt's discharge.  She is ok with a call back tomorrow regarding PW's thoughts about this but I did advise her to have pt go to ED if she is concerned about this and feels pt needs to be evaluated because these complaints are difficult to address over the phone.  She verbalized understanding of this.  [name], pls advise. Thanks!

## 2011-03-31 NOTE — Telephone Encounter (Signed)
Should not be from antibiotc There is no active infection ?work in with tammy parrett tomorrow? Could be drug reaction from the cough syrup and pain med

## 2011-03-31 NOTE — Telephone Encounter (Signed)
Unless she is using excessive cough syrup plus the pain meds, this would be the only issue

## 2011-03-31 NOTE — Telephone Encounter (Signed)
Called, spoke with pt.  She is wanting to know if the medications she is on are causing her to feel like an "obliverian idiot."  Pt states she can't communicate properly, can't finish her sentences, don't know what words she's looking for at times, and can't put words on objects at times.  Pt states this started while she was in the hospital but it has gotten worse since she was discharge from the hospital on yesterday.  Pt is wanting to know if these symptoms could be coming from med interactions or if something else is going on.  I spoke with TD regarding this.  I advised pt she will need to go to the emergency room if she feels something is going on because these symptoms are difficult to address over the phone and [name] is not in the office.  In the meantime, I will forward message to [name] for his thoughts on this.  Pt states she doesn't feel she needs to go to the emergency room right now and will await a call back regarding PW's thoughts.  I once again advised pt that ER would be best for the concerns she is calling about. [name], pls advise.  Thanks!

## 2011-04-01 NOTE — Telephone Encounter (Signed)
Called, spoke with pt.  I informed her of PW's thoughts as states below and advised this should not be from the abx.  Advised OV to be evaluated regarding this.  Pt did not want to schedule OV at this time and stated, "I will think about it then."  Advised her to call back if her symptoms did not improve or worsen.  She verbalized understanding of this.

## 2011-04-01 NOTE — Telephone Encounter (Signed)
Called, spoke with pt's daughter, Lynden Ang, who is actually at pt's house right now.  I informed her PW states pt's symptoms should not be from abx, there is no active infection, this could be a drug reaction from cough syrup and pain med, and recs pt be seen.  Per Lynden Ang, pt states she has never had a reaction from taking both the cough syrup and pain med together before.  States she would like for pt to come in to be seen but pt is declining to come in at this time.  Lynden Ang states she will monitor pt symptoms and if they get worse or do not improve prior to pending OV with TP on 04/06/11 at 11:15 - she will call back for an OV.

## 2011-04-06 ENCOUNTER — Inpatient Hospital Stay: Payer: Medicare Other | Admitting: Adult Health

## 2011-04-10 NOTE — Discharge Summary (Signed)
Brooke [name], MCGINTY NO.:  [id number]  MEDICAL RECORD NO.:  [id number]  LOCATION:  [id number]                         FACILITY:  [facility]  PHYSICIAN:  Zenia Resides, NP      DATE OF BIRTH:  01-Sep-1940  DATE OF ADMISSION:  03/20/2011 DATE OF DISCHARGE:  03/30/2011                              DISCHARGE SUMMARY   FINAL DIAGNOSES: 1. Acute on chronic respiratory failure in the setting of     bronchiectasis flare with MRSA colonization. 2. Anxiety. 3. Failure to thrive.  CONSULTANTS:  None.  PROCEDURES:  PICC line.  This was placed on March 21, 2011, and removed on March 30, 2011.  LABORATORY DATA:  On March 30, 2011:  ESR 96.  Sodium 139, potassium 4.5, chloride 103, CO2 of 31, glucose 86, BUN 9, creatinine 0.77.  White blood cell count 7.7, hemoglobin 9.1, hematocrit 27.6, platelet count 254, C difficile PCR on March 29, 2011, was negative.  BRIEF HISTORY:  This a frail 70 year old female patient, followed by [name] in the outpatient setting for a known bronchiectasis, failure to thrive, and poor mucociliary clearance, was admitted on March 20, 2011, for bronchiectasis flare.  HOSPITAL COURSE BY DISCHARGE DIAGNOSES: 1. Bronchiectasis flare.  [name] was admitted to the regular medical     ward.  Therapeutic interventions included pulmonary hygiene     measures, empiric antibiotics, and initially broad-spectrum, then     ultimately narrowed to Zyvox, supplemental oxygen, and supportive     care.  She continued to improve over the course of her     hospitalization.  She completed a full course of Zyvox, with     improvement in her pulmonary symptoms.  She was ultimately     discharged to home, having completed her Zyvox therapy for     bronchiectasis flare. 2. Diarrhea.  This was C difficile negative.  For this, she was     treated with Flagyl. 3. Anxiety.  For this, she was treated with p.r.n. alprazolam. 4. Chronic pain.  For this, she is being  given a prescription for     p.r.n. hydrocodone. 5. Diarrhea.  For this, she is instructed that she can continue     Imodium p.r.n.  DISCHARGE DIET:  Regular.  DISCHARGE MEDICATIONS: 1. Hydrocodone/chloramphenicol/Tussionex suspension 5 mL b.i.d. p.r.n.     for cough. 2. Conjugated estrogen 0.625 mg tablet/Premarin one tablet daily. 3. Promethazine 25 mg q.4 hours p.r.n. nausea. 4. Magic mouthwash 5 mL q.i.d. 5. Synthroid 150 mcg daily. 6. Alprazolam 0.5 mg 1 tablet b.i.d. p.r.n. anxiety. 7. Hydrocodone/APAP 10/325 one to two tablets every 4 hours as needed     for pain. 8. Cenestin 0.625 mg daily. 9. ProAir 2 puffs every 4 hours as needed. 10.Triamterene/hydrochlorothiazide 75/50 half a tablet daily. 11.Citalopram 20 mg, 1 tablet daily. 12.Imodium 2 mg 1 tablet 4 times a day as needed.  DISPOSITION:  [name] has met maximum benefit from inpatient care.  She is now medically cleared for discharge with followup with Dr. Shan Levans in the outpatient setting.     Zenia Resides, NP     PB/MEDQ  D:  03/30/2011  T:  03/30/2011  Job:  [id number]  Electronically Signed by Zenia Resides NP on 03/31/2011 09:53:49 PM Electronically Signed by Shan Levans MD FCCP on 04/10/2011 01:03:13 PM

## 2011-04-17 ENCOUNTER — Inpatient Hospital Stay: Payer: Medicare Other | Admitting: Adult Health

## 2011-05-11 ENCOUNTER — Telehealth: Payer: Self-pay | Admitting: Critical Care Medicine

## 2011-05-11 MED ORDER — HYDROCODONE-HOMATROPINE 5-1.5 MG/5ML PO SYRP: ORAL_SOLUTION | ORAL | Status: DC

## 2011-05-11 NOTE — Telephone Encounter (Signed)
RX has bee called into pharmacy for pt and pt is aware. Nothing further was needed

## 2011-05-11 NOTE — Telephone Encounter (Signed)
Ok to refill hydromet  

## 2011-05-11 NOTE — Telephone Encounter (Signed)
Called and spoke with pt. She is requesting a refill on hydromet.  States she has been having increased coughing- both productive and non productive.  Will get up yellow sputum.  Also c/o hoarseness d/t increased cough, sneezing, runny nose and sore throat which she has been taking Cepacol for.  Denies f/c/s. PW, please advise if ok to send rx for Hydromet . Thanks. Allergies  Allergen Reactions  . Doxycycline     vomiting  . Levofloxacin   . Mometasone Furoate     REACTION: swollen throat, thrush  . Penicillins   . Sulfonamide Derivatives

## 2011-05-15 ENCOUNTER — Other Ambulatory Visit: Payer: Medicare Other

## 2011-05-15 ENCOUNTER — Other Ambulatory Visit: Payer: Self-pay | Admitting: Pulmonary Disease

## 2011-05-15 ENCOUNTER — Other Ambulatory Visit: Payer: Self-pay | Admitting: Critical Care Medicine

## 2011-05-15 ENCOUNTER — Other Ambulatory Visit: Payer: Self-pay | Admitting: *Deleted

## 2011-05-15 ENCOUNTER — Telehealth: Payer: Self-pay | Admitting: Critical Care Medicine

## 2011-05-15 DIAGNOSIS — A310 Pulmonary mycobacterial infection: Secondary | ICD-10-CM

## 2011-05-15 DIAGNOSIS — J479 Bronchiectasis, uncomplicated: Secondary | ICD-10-CM

## 2011-05-15 NOTE — Telephone Encounter (Signed)
Order has been placed into epic.

## 2011-05-15 NOTE — Telephone Encounter (Signed)
I spoke with pt and she states she is on her way here to leave a sputum sample in the lab. Pt states she is getting up green phlem and wants this tested. Please advise [name], thanks

## 2011-05-15 NOTE — Telephone Encounter (Signed)
Ok to send sputum c/s routine

## 2011-05-18 ENCOUNTER — Telehealth: Payer: Self-pay | Admitting: Critical Care Medicine

## 2011-05-18 LAB — RESPIRATORY CULTURE OR RESPIRATORY AND SPUTUM CULTURE

## 2011-05-18 MED ORDER — CIPROFLOXACIN HCL 500 MG PO TABS: 500.0000 mg | ORAL_TABLET | Freq: Two times a day (BID) | ORAL | Status: AC

## 2011-05-18 NOTE — Telephone Encounter (Signed)
Sputum c/s pos pseudomonas Will call in 10days of cipro  500 bid generic

## 2011-05-19 ENCOUNTER — Telehealth: Payer: Self-pay | Admitting: Critical Care Medicine

## 2011-05-19 NOTE — Telephone Encounter (Signed)
This is not contagious. I would not let small children though, < 72yo near the pt

## 2011-05-19 NOTE — Telephone Encounter (Signed)
Spoke with Chip Boer, pt's daughter. She states that the pt told her that she is being txed for infection, but unsure what it was called. I advised that her sputum grew bacteria called pseudomonas, and this is why PW prescribed abx for her. She verbalized understanding. She is asking if this in contagious, or if she should keep her children away from her while being txed. I advised not entirely sure about this so will ask PW. Please advise, thanks

## 2011-05-19 NOTE — Telephone Encounter (Signed)
Spoke with Vickie and notified of recs per PW. She verbalized understanding and states nothing further needed.

## 2011-05-19 NOTE — Telephone Encounter (Signed)
See my earlier comments

## 2011-05-26 ENCOUNTER — Other Ambulatory Visit: Payer: Self-pay | Admitting: Allergy

## 2011-05-26 ENCOUNTER — Telehealth: Payer: Self-pay | Admitting: Critical Care Medicine

## 2011-05-26 DIAGNOSIS — J479 Bronchiectasis, uncomplicated: Secondary | ICD-10-CM

## 2011-05-26 MED ORDER — HYDROCODONE-ACETAMINOPHEN 10-325 MG PO TABS: ORAL_TABLET | ORAL | Status: DC

## 2011-05-26 NOTE — Telephone Encounter (Signed)
Pt says she only has 1 refill left for her hydrocodone and would like for [name] to go ahead and send additional refills to her pharmacy for future use. Pt is aware we will have to get this okayed by PW and he will not be back in the office until Thurs., 12/20. Pt verbalized understanding. [name], pls advise on refill for hydrocodone.

## 2011-05-26 NOTE — Telephone Encounter (Signed)
Ok for one refill beyond current

## 2011-05-26 NOTE — Telephone Encounter (Signed)
Refill called to [facility]. Pt notified.

## 2011-06-24 ENCOUNTER — Other Ambulatory Visit: Payer: Self-pay | Admitting: *Deleted

## 2011-06-24 ENCOUNTER — Telehealth: Payer: Self-pay | Admitting: Critical Care Medicine

## 2011-06-24 MED ORDER — HYDROCODONE-HOMATROPINE 5-1.5 MG/5ML PO SYRP: ORAL_SOLUTION | ORAL | Status: DC

## 2011-06-24 NOTE — Telephone Encounter (Signed)
I have called rx into the pharmacy and pt is aware.

## 2011-06-24 NOTE — Telephone Encounter (Signed)
Rx request already in PW's look at box-will call her with approval or denial from PW asap.

## 2011-06-24 NOTE — Telephone Encounter (Signed)
i already responded to this  

## 2011-06-24 NOTE — Telephone Encounter (Signed)
Ok to refill 

## 2011-06-24 NOTE — Telephone Encounter (Signed)
Received a fax from pt pharmacy requesting a refill on hydromet cough syrup, directions take 5 mls by mouth every 4-6 hours as needed for cough. Last fill date 05-11-11 for#148mls. Please advise if ok to refill. Thanks. Carron Curie, CMA

## 2011-07-15 ENCOUNTER — Encounter: Payer: Self-pay | Admitting: Critical Care Medicine

## 2011-07-15 ENCOUNTER — Ambulatory Visit (INDEPENDENT_AMBULATORY_CARE_PROVIDER_SITE_OTHER): Payer: Medicare Other | Admitting: Critical Care Medicine

## 2011-07-15 VITALS — BP 98/56 | HR 81 | Temp 98.5°F | Ht 65.0 in | Wt 101.2 lb

## 2011-07-15 DIAGNOSIS — R079 Chest pain, unspecified: Secondary | ICD-10-CM

## 2011-07-15 DIAGNOSIS — J479 Bronchiectasis, uncomplicated: Secondary | ICD-10-CM

## 2011-07-15 DIAGNOSIS — Z23 Encounter for immunization: Secondary | ICD-10-CM

## 2011-07-15 MED ORDER — HYDROCODONE-HOMATROPINE 5-1.5 MG/5ML PO SYRP: ORAL_SOLUTION | ORAL | Status: DC

## 2011-07-15 MED ORDER — HYDROCODONE-ACETAMINOPHEN 10-325 MG PO TABS: ORAL_TABLET | ORAL | Status: AC

## 2011-07-15 MED ORDER — ALPRAZOLAM 0.5 MG PO TABS: 0.5000 mg | ORAL_TABLET | Freq: Three times a day (TID) | ORAL | Status: DC | PRN

## 2011-07-15 MED ORDER — CIPROFLOXACIN HCL 500 MG PO TABS: 500.0000 mg | ORAL_TABLET | Freq: Two times a day (BID) | ORAL | Status: AC

## 2011-07-15 NOTE — Assessment & Plan Note (Signed)
Acute tracheobronchitis with bronchiectasis flare and previous colonization and infection with Pseudomonas Chronic pain syndrome associated Plan Refill pain medication Refill benzodiazepine Administer 14 day course of Cipro 500  mg twice daily Next steroids Continue mucolytic  and pulmonary toilet

## 2011-07-15 NOTE — Patient Instructions (Signed)
Take cipro 500mg  twice daily for 14days Refills on xanax, norco, hydromet given No other medication changes Return 6 weeks

## 2011-07-15 NOTE — Progress Notes (Signed)
Subjective:    Patient ID: Brooke [name], female    DOB: 1941/04/21, 71 y.o.   MRN: [id number]  HPI  71 y.o..WF with Bronchiectasis. MRSA colonization   10/12 Notes more cough, productive yellow,  now is green.  No real chest pain.  Has to hold ribs if coughs.  Notes more severe rubbing pain.  Notes more dyspnea and weakness. Not able to eat.  Notes N/V and no appt.  ENT rx cefdinir.   07/15/2011 Slowly worse for one month, then now 5 days of unable to get up mucus, nose is stopped up, ears stopped up.  Mucus is small in amount.  Bilious green.      Past Medical History  Diagnosis Date  . Hypothyroidism   . History of allergy   . Mycobacterium avium complex   . Bronchiectasis     colonized mrsa, s/p vancomyin IV 10 days 5/10 and 7 days 6/10     No family history on file.   History   Social History  . Marital Status: Widowed    Spouse Name: N/A    Number of Children: N/A  . Years of Education: N/A   Occupational History  . Not on file.   Social History Main Topics  . Smoking status: Former Smoker -- 1.0 packs/day for 3 years    Types: Cigarettes    Quit date: 06/08/1962  . Smokeless tobacco: Never Used  . Alcohol Use: Not on file  . Drug Use: Not on file  . Sexually Active: Not on file   Other Topics Concern  . Not on file   Social History Narrative  . No narrative on file     Allergies  Allergen Reactions  . Doxycycline     vomiting  . Levofloxacin   . Mometasone Furoate     REACTION: swollen throat, thrush  . Penicillins   . Sulfonamide Derivatives      Outpatient Prescriptions Prior to Visit  Medication Sig Dispense Refill  . albuterol (PROAIR HFA) 108 (90 BASE) MCG/ACT inhaler Inhale 2 puffs into the lungs every 6 (six) hours as needed for wheezing.  1 Inhaler  6  . CENESTIN 0.625 MG tablet Once daily      . citalopram (CELEXA) 20 MG tablet Take 20 mg by mouth every other day.       . levothyroxine (SYNTHROID, LEVOTHROID) 100 MCG tablet 1/2 tablet  once daily      . polysaccharide iron (NIFEREX) 150 MG CAPS capsule Take 1 capsule (150 mg total) by mouth daily.  30 each  6  . triamterene-hydrochlorothiazide (MAXZIDE) 75-50 MG per tablet 1/2 tablet once daily      . ALPRAZolam (XANAX) 0.5 MG tablet Take 1 tablet (0.5 mg total) by mouth 3 (three) times daily as needed for anxiety.  90 tablet  0  . HYDROcodone-homatropine (HYDROMET) 5-1.5 MG/5ML syrup Take 1 tsp every 4-6 hrs as needed for cough  180 mL  0  . cefdinir (OMNICEF) 300 MG capsule Take 300 mg by mouth 2 (two) times daily.            Review of Systems  Constitutional:   No  weight loss, night sweats,  Fevers, chills, fatigue, lassitude. HEENT:   No headaches,  Difficulty swallowing,  Tooth/dental problems,  Sore throat,                No sneezing, itching, ear ache, nasal congestion, post nasal drip,   CV:  Notes  chest pain,  Orthopnea, PND, swelling in lower extremities, anasarca, dizziness, palpitations  GI  No heartburn, indigestion, abdominal pain, ++ nausea, +++ vomiting, diarrhea, change in bowel habits, loss of appetite  Resp: Notes  shortness of breath with exertion and  at rest.  Notes  excess mucus, notes  productive cough,  No non-productive cough,  No coughing up of blood.  Notes  change in color of mucus.  Notes  wheezing.  No chest wall deformity  Skin: no rash or lesions.  GU: no dysuria, change in color of urine, no urgency or frequency.  No flank pain.  MS:  No joint pain or swelling.  No decreased range of motion.  No back pain.  Psych:  No change in mood or affect. No depression or anxiety.  No memory loss.     Objective:   Physical Exam  Filed Vitals:   07/15/11 1522  BP: 98/56  Pulse: 81  Temp: 98.5 F (36.9 C)  TempSrc: Oral  Height: 5\' 5"  (1.651 m)  Weight: 101 lb 3.2 oz (45.904 kg)  SpO2: 95%    Gen: Pleasant, well-nourished, in no distress,  normal affect  ENT: No lesions,  mouth clear,  oropharynx clear, no postnasal  drip  Neck: No JVD, no TMG, no carotid bruits  Lungs: No use of accessory muscles, no dullness to percussion, bilateral rhonchi , poor airflow  Cardiovascular: RRR, heart sounds normal, no murmur or gallops, no peripheral edema  Abdomen: soft and NT, no HSM,  BS normal  Musculoskeletal: No deformities, no cyanosis or clubbing  Neuro: alert, non focal  Skin: Warm, no lesions or rashes,  Site of picc line not infected, edema around elbow is fluid should resolve        Assessment & Plan:   BRONCHIECTASIS Acute tracheobronchitis with bronchiectasis flare and previous colonization and infection with Pseudomonas Chronic pain syndrome associated Plan Refill pain medication Refill benzodiazepine Administer 14 day course of Cipro 500  mg twice daily Next steroids Continue mucolytic  and pulmonary toilet     Updated Medication List Outpatient Encounter Prescriptions as of 07/15/2011  Medication Sig Dispense Refill  . albuterol (PROAIR HFA) 108 (90 BASE) MCG/ACT inhaler Inhale 2 puffs into the lungs every 6 (six) hours as needed for wheezing.  1 Inhaler  6  . ALPRAZolam (XANAX) 0.5 MG tablet Take 1 tablet (0.5 mg total) by mouth 3 (three) times daily as needed for anxiety.  90 tablet  4  . aspirin 325 MG tablet Take 325 mg by mouth as needed.      . CENESTIN 0.625 MG tablet Once daily      . citalopram (CELEXA) 20 MG tablet Take 20 mg by mouth every other day.       Marland Kitchen HYDROcodone-homatropine (HYDROMET) 5-1.5 MG/5ML syrup Take 1 tsp every 4-6 hrs as needed for cough  180 mL  0  . levothyroxine (SYNTHROID, LEVOTHROID) 100 MCG tablet 1/2 tablet once daily      . Multiple Vitamins-Minerals (MULTIVITAL PO) Take by mouth daily.      . polysaccharide iron (NIFEREX) 150 MG CAPS capsule Take 1 capsule (150 mg total) by mouth daily.  30 each  6  . triamterene-hydrochlorothiazide (MAXZIDE) 75-50 MG per tablet 1/2 tablet once daily      . DISCONTD: ALPRAZolam (XANAX) 0.5 MG tablet Take 1  tablet (0.5 mg total) by mouth 3 (three) times daily as needed for anxiety.  90 tablet  0  . DISCONTD: HYDROcodone-homatropine (HYDROMET) 5-1.5 MG/5ML syrup Take  1 tsp every 4-6 hrs as needed for cough  180 mL  0  . ciprofloxacin (CIPRO) 500 MG tablet Take 1 tablet (500 mg total) by mouth 2 (two) times daily.  28 tablet  0  . HYDROcodone-acetaminophen (NORCO) 10-325 MG per tablet 1 - 2 by mouth Every 4 hours as needed  60 tablet  0  . DISCONTD: cefdinir (OMNICEF) 300 MG capsule Take 300 mg by mouth 2 (two) times daily.

## 2011-07-24 ENCOUNTER — Telehealth: Payer: Self-pay | Admitting: Critical Care Medicine

## 2011-07-24 NOTE — Telephone Encounter (Signed)
I spoke with pt and she is requesting to have refills called into [facility] for future use. She does not need it now just to have in on file just in case. She states she starts panicking when she does not have refills on file bc she is afraid to run out. Please advise [name], thanks

## 2011-07-24 NOTE — Telephone Encounter (Signed)
Call only one refill for now

## 2011-07-24 NOTE — Telephone Encounter (Signed)
Called and spoke with Tammy at [facility] and advised to add 1 refill only to norco rx. She advised this was done. Spoke with pt and notified that this was done and nothing further needed.

## 2011-08-14 ENCOUNTER — Telehealth: Payer: Self-pay | Admitting: Critical Care Medicine

## 2011-08-14 NOTE — Telephone Encounter (Signed)
Pt is calling and requesting refill of the hydrocodone.  Please advise if ok to send in refill for the pt.  thanks

## 2011-08-15 NOTE — Telephone Encounter (Signed)
Ok for refill? 

## 2011-08-17 MED ORDER — HYDROCODONE-ACETAMINOPHEN 10-325 MG PO TABS: ORAL_TABLET | ORAL | Status: DC

## 2011-08-17 NOTE — Telephone Encounter (Signed)
Refill was called to pharm. LMOM for pt to be made aware.

## 2011-08-27 ENCOUNTER — Telehealth: Payer: Self-pay | Admitting: Critical Care Medicine

## 2011-08-27 MED ORDER — HYDROCODONE-HOMATROPINE 5-1.5 MG/5ML PO SYRP: ORAL_SOLUTION | ORAL | Status: DC

## 2011-08-27 NOTE — Telephone Encounter (Signed)
Called and spoke with pt who is aware PW ok'd refill.  rx sent to pharmacy.

## 2011-08-27 NOTE — Telephone Encounter (Signed)
Ok to refill x 1  

## 2011-08-27 NOTE — Telephone Encounter (Signed)
Pt c/o a hacky cough x 3 days. She states she has a tickle in her throat from sinus drainage and the cough kept her up most of the night last night.  She would like a refill for Hydromet. Pt is scheduled for OV with PW on Mon., 08/31/11. Pls advise on refill.

## 2011-08-31 ENCOUNTER — Encounter: Payer: Self-pay | Admitting: Critical Care Medicine

## 2011-08-31 ENCOUNTER — Ambulatory Visit (INDEPENDENT_AMBULATORY_CARE_PROVIDER_SITE_OTHER): Payer: Medicare Other | Admitting: Critical Care Medicine

## 2011-08-31 VITALS — BP 108/62 | HR 68 | Temp 98.0°F | Ht 65.0 in | Wt 105.2 lb

## 2011-08-31 DIAGNOSIS — J479 Bronchiectasis, uncomplicated: Secondary | ICD-10-CM

## 2011-08-31 MED ORDER — HYDROCODONE-HOMATROPINE 5-1.5 MG/5ML PO SYRP: ORAL_SOLUTION | ORAL | Status: DC

## 2011-08-31 MED ORDER — DOXYCYCLINE HYCLATE 100 MG PO CAPS: 100.0000 mg | ORAL_CAPSULE | Freq: Two times a day (BID) | ORAL | Status: AC

## 2011-08-31 MED ORDER — HYDROCODONE-ACETAMINOPHEN 10-325 MG PO TABS: ORAL_TABLET | ORAL | Status: DC

## 2011-08-31 NOTE — Progress Notes (Signed)
Subjective:    Patient ID: Brooke [name], female    DOB: [birth year], 71 y.o.   MRN: [id number]  HPI  71 y.o..WF with Bronchiectasis. MRSA colonization   10/12 Notes more cough, productive yellow,  now is green.  No real chest pain.  Has to hold ribs if coughs.  Notes more severe rubbing pain.  Notes more dyspnea and weakness. Not able to eat.  Notes N/V and no appt.  ENT rx cefdinir.   07/15/11 Slowly worse for one month, then now 5 days of unable to get up mucus, nose is stopped up, ears stopped up.  Mucus is small in amount.  Bilious green.       08/31/2011 At last ov we Rx 14days cipro. Pt notes more cough and chest congestion.  Now with sinus pressure, drainage, R ear drains, bloody, yellow green.  Also coughing up yellow to beige green.   Chest pain as before.   Past Medical History  Diagnosis Date  . Hypothyroidism   . History of allergy   . Mycobacterium avium complex   . Bronchiectasis     colonized mrsa, s/p vancomyin IV 10 days 5/10 and 7 days 6/10     No family history on file.   History   Social History  . Marital Status: Widowed    Spouse Name: N/A    Number of Children: N/A  . Years of Education: N/A   Occupational History  . Not on file.   Social History Main Topics  . Smoking status: Former Smoker -- 1.0 packs/day for 3 years    Types: Cigarettes    Quit date: 06/08/1962  . Smokeless tobacco: Never Used  . Alcohol Use: Not on file  . Drug Use: Not on file  . Sexually Active: Not on file   Other Topics Concern  . Not on file   Social History Narrative  . No narrative on file     Allergies  Allergen Reactions  . Doxycycline     vomiting  . Levofloxacin   . Mometasone Furoate     REACTION: swollen throat, thrush  . Penicillins   . Sulfonamide Derivatives      Outpatient Prescriptions Prior to Visit  Medication Sig Dispense Refill  . albuterol (PROAIR HFA) 108 (90 BASE) MCG/ACT inhaler Inhale 2 puffs into the lungs every 6 (six) hours as  needed for wheezing.  1 Inhaler  6  . ALPRAZolam (XANAX) 0.5 MG tablet Take 1 tablet (0.5 mg total) by mouth 3 (three) times daily as needed for anxiety.  90 tablet  4  . CENESTIN 0.625 MG tablet Once daily      . citalopram (CELEXA) 20 MG tablet Take 20 mg by mouth daily.       Marland Kitchen levothyroxine (SYNTHROID, LEVOTHROID) 100 MCG tablet 1/2 tablet once daily      . Multiple Vitamins-Minerals (MULTIVITAL PO) Take by mouth daily.      Marland Kitchen triamterene-hydrochlorothiazide (MAXZIDE) 75-50 MG per tablet 1/2 tablet once daily      . HYDROcodone-acetaminophen (NORCO) 10-325 MG per tablet 1-2 tablets by mouth every 4 hours as needed  60 tablet  0  . HYDROcodone-homatropine (HYDROMET) 5-1.5 MG/5ML syrup Take 1 tsp every 4-6 hrs as needed for cough  180 mL  0  . aspirin 325 MG tablet Take 325 mg by mouth as needed.      . polysaccharide iron (NIFEREX) 150 MG CAPS capsule Take 1 capsule (150 mg total) by mouth daily.  30  each  6      Review of Systems  Constitutional:   No  weight loss, night sweats,  Fevers, chills, fatigue, lassitude. HEENT:   No headaches,  Difficulty swallowing,  Tooth/dental problems,  Sore throat,                No sneezing, itching, ear ache, nasal congestion, post nasal drip,   CV:  Notes  chest pain,  Orthopnea, PND, swelling in lower extremities, anasarca, dizziness, palpitations  GI  No heartburn, indigestion, abdominal pain, ++ nausea, +++ vomiting, diarrhea, change in bowel habits, loss of appetite  Resp: Notes  shortness of breath with exertion and  at rest.  Notes  excess mucus, notes  productive cough,  No non-productive cough,  No coughing up of blood.  Notes  change in color of mucus.  Notes  wheezing.  No chest wall deformity  Skin: no rash or lesions.  GU: no dysuria, change in color of urine, no urgency or frequency.  No flank pain.  MS:  No joint pain or swelling.  No decreased range of motion.  No back pain.  Psych:  No change in mood or affect. No depression or  anxiety.  No memory loss.     Objective:   Physical Exam  Filed Vitals:   08/31/11 1444  BP: 108/62  Pulse: 68  Temp: 98 F (36.7 C)  TempSrc: Oral  Height: 5\' 5"  (1.651 m)  Weight: 105 lb 3.2 oz (47.718 kg)  SpO2: 98%    Gen: Thin and cachectic white female  ENT: No lesions,  mouth clear,  oropharynx clear, ++ postnasal drip, purulent nares  Neck: No JVD, no TMG, no carotid bruits  Lungs: No use of accessory muscles, no dullness to percussion, bilateral rhonchi , poor airflow  Cardiovascular: RRR, heart sounds normal, no murmur or gallops, no peripheral edema  Abdomen: soft and NT, no HSM,  BS normal  Musculoskeletal: No deformities, no cyanosis or clubbing  Neuro: alert, non focal  Skin: Warm, no lesions or rashes,  Site of picc line not infected, edema around elbow is fluid should resolve        Assessment & Plan:   BRONCHIECTASIS Bronchiectasis with associated chronic and acute sinusitis flare Plan Doxycycline 100 mg twice daily for 10 days Pain medication refilled Cough suppressant refill      Updated Medication List Outpatient Encounter Prescriptions as of 08/31/2011  Medication Sig Dispense Refill  . albuterol (PROAIR HFA) 108 (90 BASE) MCG/ACT inhaler Inhale 2 puffs into the lungs every 6 (six) hours as needed for wheezing.  1 Inhaler  6  . ALPRAZolam (XANAX) 0.5 MG tablet Take 1 tablet (0.5 mg total) by mouth 3 (three) times daily as needed for anxiety.  90 tablet  4  . Aspirin-Salicylamide-Caffeine (BC HEADACHE POWDER PO) Take by mouth as needed.      . CENESTIN 0.625 MG tablet Once daily      . citalopram (CELEXA) 20 MG tablet Take 20 mg by mouth daily.       Marland Kitchen HYDROcodone-acetaminophen (NORCO) 10-325 MG per tablet 1-2 tablets by mouth every 4 hours as needed  60 tablet  3  . HYDROcodone-homatropine (HYDROMET) 5-1.5 MG/5ML syrup Take 1 tsp every 4-6 hrs as needed for cough  180 mL  3  . levothyroxine (SYNTHROID, LEVOTHROID) 100 MCG tablet 1/2  tablet once daily      . Multiple Vitamins-Minerals (MULTIVITAL PO) Take by mouth daily.      Marland Kitchen  triamterene-hydrochlorothiazide (MAXZIDE) 75-50 MG per tablet 1/2 tablet once daily      . DISCONTD: HYDROcodone-acetaminophen (NORCO) 10-325 MG per tablet 1-2 tablets by mouth every 4 hours as needed  60 tablet  0  . DISCONTD: HYDROcodone-homatropine (HYDROMET) 5-1.5 MG/5ML syrup Take 1 tsp every 4-6 hrs as needed for cough  180 mL  0  . doxycycline (VIBRAMYCIN) 100 MG capsule Take 1 capsule (100 mg total) by mouth 2 (two) times daily.  20 capsule  0  . DISCONTD: aspirin 325 MG tablet Take 325 mg by mouth as needed.      Marland Kitchen DISCONTD: polysaccharide iron (NIFEREX) 150 MG CAPS capsule Take 1 capsule (150 mg total) by mouth daily.  30 each  6

## 2011-08-31 NOTE — Assessment & Plan Note (Signed)
Bronchiectasis with associated chronic and acute sinusitis flare Plan Doxycycline 100 mg twice daily for 10 days Pain medication refilled Cough suppressant refill

## 2011-08-31 NOTE — Patient Instructions (Signed)
Doxycycline one twice daily for 10days, take with food Hydromet and Norco refills given  Return 6 weeks

## 2011-09-10 ENCOUNTER — Telehealth: Payer: Self-pay | Admitting: Critical Care Medicine

## 2011-09-10 NOTE — Telephone Encounter (Signed)
I spoke with pt and she stated she just finished doxycyline and is now coughing up very little streaks of red blood mixed in with yellow phlem x 4 days. Pt denies any other symptoms just the cough. I offered pt an apt for tomorrow but declined and stated she is just going to watch "it" and see how she does. Pt scheduled an apt to see PW on 09/16/11 11:45. I advised pt if she worsens then to seek emergency care/or call back for sooner apt. Pt did not want to see anyone but PW. Will forward to PW so he is aware

## 2011-09-11 NOTE — Telephone Encounter (Signed)
noted 

## 2011-09-16 ENCOUNTER — Ambulatory Visit: Payer: Medicare Other | Admitting: Critical Care Medicine

## 2011-09-17 ENCOUNTER — Ambulatory Visit: Payer: Medicare Other | Admitting: Adult Health

## 2011-09-18 ENCOUNTER — Ambulatory Visit (INDEPENDENT_AMBULATORY_CARE_PROVIDER_SITE_OTHER): Payer: Medicare Other | Admitting: Adult Health

## 2011-09-18 ENCOUNTER — Inpatient Hospital Stay (HOSPITAL_COMMUNITY)
Admission: AD | Admit: 2011-09-18 | Discharge: 2011-09-28 | DRG: 193 | Disposition: A | Discharge status: Home Health | Payer: Medicare Other | Source: Ambulatory Visit | Attending: Critical Care Medicine | Admitting: Critical Care Medicine

## 2011-09-18 ENCOUNTER — Encounter: Payer: Self-pay | Admitting: Adult Health

## 2011-09-18 ENCOUNTER — Encounter (HOSPITAL_COMMUNITY): Payer: Self-pay | Admitting: *Deleted

## 2011-09-18 ENCOUNTER — Ambulatory Visit (HOSPITAL_COMMUNITY)
Admission: RE | Admit: 2011-09-18 | Discharge: 2011-09-18 | Disposition: A | Discharge status: Home / Self Care | Payer: Medicare Other | Source: Ambulatory Visit | Attending: Adult Health | Admitting: Adult Health

## 2011-09-18 VITALS — HR 80 | Temp 99.0°F | Ht 65.5 in | Wt 101.0 lb

## 2011-09-18 DIAGNOSIS — Z22322 Carrier or suspected carrier of Methicillin resistant Staphylococcus aureus: Secondary | ICD-10-CM

## 2011-09-18 DIAGNOSIS — J479 Bronchiectasis, uncomplicated: Secondary | ICD-10-CM

## 2011-09-18 DIAGNOSIS — A310 Pulmonary mycobacterial infection: Secondary | ICD-10-CM

## 2011-09-18 DIAGNOSIS — Z9109 Other allergy status, other than to drugs and biological substances: Secondary | ICD-10-CM

## 2011-09-18 DIAGNOSIS — D649 Anemia, unspecified: Secondary | ICD-10-CM | POA: Diagnosis present

## 2011-09-18 DIAGNOSIS — M109 Gout, unspecified: Secondary | ICD-10-CM | POA: Diagnosis not present

## 2011-09-18 DIAGNOSIS — J189 Pneumonia, unspecified organism: Principal | ICD-10-CM | POA: Diagnosis present

## 2011-09-18 DIAGNOSIS — R0902 Hypoxemia: Secondary | ICD-10-CM | POA: Diagnosis present

## 2011-09-18 DIAGNOSIS — E876 Hypokalemia: Secondary | ICD-10-CM | POA: Diagnosis present

## 2011-09-18 DIAGNOSIS — E039 Hypothyroidism, unspecified: Secondary | ICD-10-CM

## 2011-09-18 DIAGNOSIS — E43 Unspecified severe protein-calorie malnutrition: Secondary | ICD-10-CM | POA: Diagnosis present

## 2011-09-18 DIAGNOSIS — R079 Chest pain, unspecified: Secondary | ICD-10-CM | POA: Diagnosis present

## 2011-09-18 DIAGNOSIS — J9600 Acute respiratory failure, unspecified whether with hypoxia or hypercapnia: Secondary | ICD-10-CM | POA: Diagnosis present

## 2011-09-18 DIAGNOSIS — Z681 Body mass index (BMI) 19 or less, adult: Secondary | ICD-10-CM

## 2011-09-18 DIAGNOSIS — R197 Diarrhea, unspecified: Secondary | ICD-10-CM | POA: Diagnosis present

## 2011-09-18 DIAGNOSIS — J471 Bronchiectasis with (acute) exacerbation: Secondary | ICD-10-CM | POA: Diagnosis present

## 2011-09-18 DIAGNOSIS — E46 Unspecified protein-calorie malnutrition: Secondary | ICD-10-CM | POA: Diagnosis present

## 2011-09-18 HISTORY — DX: Headache: R51

## 2011-09-18 HISTORY — DX: Pneumonia, unspecified organism: J18.9

## 2011-09-18 LAB — DIFFERENTIAL
Eosinophils Absolute: 0.2 10*3/uL (ref 0.0–0.7)
Eosinophils Relative: 2 % (ref 0–5)
Lymphs Abs: 1.8 10*3/uL (ref 0.7–4.0)

## 2011-09-18 LAB — CBC
MCH: 28.4 pg (ref 26.0–34.0)
MCHC: 32.2 g/dL (ref 30.0–36.0)
Platelets: 496 10*3/uL — ABNORMAL HIGH (ref 150–400)
RDW: 14.2 % (ref 11.5–15.5)

## 2011-09-18 LAB — COMPREHENSIVE METABOLIC PANEL
ALT: 7 U/L (ref 0–35)
AST: 20 U/L (ref 0–37)
Calcium: 8.9 mg/dL (ref 8.4–10.5)
GFR calc Af Amer: 72 mL/min — ABNORMAL LOW (ref >90)
Sodium: 138 mEq/L (ref 135–145)
Total Protein: 7.3 g/dL (ref 6.0–8.3)

## 2011-09-18 LAB — BLOOD GAS, ARTERIAL
Bicarbonate: 28.4 mEq/L — ABNORMAL HIGH (ref 20.0–24.0)
FIO2: 0.21 %
pCO2 arterial: 43.6 mmHg (ref 35.0–45.0)
pH, Arterial: 7.429 — ABNORMAL HIGH (ref 7.350–7.400)
pO2, Arterial: 65.7 mmHg — ABNORMAL LOW (ref 80.0–100.0)

## 2011-09-18 LAB — PRO B NATRIURETIC PEPTIDE: Pro B Natriuretic peptide (BNP): 671.9 pg/mL — ABNORMAL HIGH (ref 0–125)

## 2011-09-18 LAB — CARDIAC PANEL(CRET KIN+CKTOT+MB+TROPI)
Relative Index: INVALID (ref 0.0–2.5)
Troponin I: <0.3 ng/mL (ref <0.30)

## 2011-09-18 LAB — MRSA PCR SCREENING: MRSA by PCR: POSITIVE — AB

## 2011-09-18 LAB — PROCALCITONIN: Procalcitonin: <0.1 ng/mL

## 2011-09-18 MED ORDER — PANTOPRAZOLE SODIUM 40 MG PO TBEC
40.0000 mg | DELAYED_RELEASE_TABLET | Freq: Every day | ORAL | Status: DC
  Administered 2011-09-18: 40 mg via ORAL
  Filled 2011-09-18 (×2): qty 1

## 2011-09-18 MED ORDER — ALBUTEROL SULFATE (5 MG/ML) 0.5% IN NEBU
2.5000 mg | INHALATION_SOLUTION | Freq: Four times a day (QID) | RESPIRATORY_TRACT | Status: DC
  Administered 2011-09-18 – 2011-09-26 (×20): 2.5 mg via RESPIRATORY_TRACT
  Filled 2011-09-18 (×21): qty 0.5

## 2011-09-18 MED ORDER — IPRATROPIUM BROMIDE 0.02 % IN SOLN
0.5000 mg | Freq: Four times a day (QID) | RESPIRATORY_TRACT | Status: DC
  Administered 2011-09-18 – 2011-09-26 (×20): 0.5 mg via RESPIRATORY_TRACT
  Filled 2011-09-18 (×23): qty 2.5

## 2011-09-18 MED ORDER — SODIUM CHLORIDE 0.9 % IJ SOLN
3.0000 mL | Freq: Two times a day (BID) | INTRAMUSCULAR | Status: DC
  Administered 2011-09-18 – 2011-09-19 (×2): 3 mL via INTRAVENOUS

## 2011-09-18 MED ORDER — ALPRAZOLAM 0.5 MG PO TABS
0.5000 mg | ORAL_TABLET | Freq: Three times a day (TID) | ORAL | Status: DC | PRN
  Administered 2011-09-19 – 2011-09-28 (×13): 0.5 mg via ORAL
  Filled 2011-09-18 (×14): qty 1

## 2011-09-18 MED ORDER — HYDROCODONE-ACETAMINOPHEN 10-325 MG PO TABS
1.0000 | ORAL_TABLET | Freq: Four times a day (QID) | ORAL | Status: DC | PRN
  Administered 2011-09-18 – 2011-09-21 (×7): 1 via ORAL
  Filled 2011-09-18 (×7): qty 1

## 2011-09-18 MED ORDER — ALBUTEROL SULFATE (5 MG/ML) 0.5% IN NEBU
2.5000 mg | INHALATION_SOLUTION | RESPIRATORY_TRACT | Status: DC | PRN
  Filled 2011-09-18 (×4): qty 0.5

## 2011-09-18 MED ORDER — SODIUM CHLORIDE 0.9 % IV SOLN
250.0000 mL | INTRAVENOUS | Status: DC | PRN
  Administered 2011-09-21: 1000 mL via INTRAVENOUS
  Administered 2011-09-23: 20 mL via INTRAVENOUS
  Administered 2011-09-28: 1000 mL via INTRAVENOUS

## 2011-09-18 MED ORDER — ESTROGENS CONJUGATED 0.625 MG PO TABS
0.6250 mg | ORAL_TABLET | Freq: Every day | ORAL | Status: DC
  Administered 2011-09-18 – 2011-09-28 (×11): 0.625 mg via ORAL
  Filled 2011-09-18 (×11): qty 1

## 2011-09-18 MED ORDER — DEXTROSE 5 % IV SOLN
2.0000 g | INTRAVENOUS | Status: DC
  Administered 2011-09-18 – 2011-09-24 (×7): 2 g via INTRAVENOUS
  Filled 2011-09-18 (×8): qty 2

## 2011-09-18 MED ORDER — CIPROFLOXACIN IN D5W 400 MG/200ML IV SOLN
400.0000 mg | Freq: Two times a day (BID) | INTRAVENOUS | Status: DC
  Administered 2011-09-18 – 2011-09-25 (×14): 400 mg via INTRAVENOUS
  Filled 2011-09-18 (×17): qty 200

## 2011-09-18 MED ORDER — ESTROGENS CONJ SYNTHETIC A 0.625 MG PO TABS: 0.6250 mg | ORAL_TABLET | Freq: Every day | ORAL | Status: DC

## 2011-09-18 MED ORDER — LEVOTHYROXINE SODIUM 50 MCG PO TABS
50.0000 ug | ORAL_TABLET | Freq: Every day | ORAL | Status: DC
  Administered 2011-09-19 – 2011-09-28 (×10): 50 ug via ORAL
  Filled 2011-09-18 (×11): qty 1

## 2011-09-18 MED ORDER — SODIUM CHLORIDE 0.9 % IV SOLN: 250.0000 mL | INTRAVENOUS | Status: DC | PRN

## 2011-09-18 MED ORDER — VANCOMYCIN HCL 1000 MG IV SOLR
750.0000 mg | INTRAVENOUS | Status: DC
  Administered 2011-09-18 – 2011-09-20 (×3): 750 mg via INTRAVENOUS
  Filled 2011-09-18 (×4): qty 750

## 2011-09-18 MED ORDER — CITALOPRAM HYDROBROMIDE 20 MG PO TABS
20.0000 mg | ORAL_TABLET | Freq: Every day | ORAL | Status: DC
  Administered 2011-09-18 – 2011-09-28 (×11): 20 mg via ORAL
  Filled 2011-09-18 (×11): qty 1

## 2011-09-18 MED ORDER — SODIUM CHLORIDE 0.9 % IJ SOLN: 3.0000 mL | INTRAMUSCULAR | Status: DC | PRN

## 2011-09-18 MED ORDER — HEPARIN SODIUM (PORCINE) 5000 UNIT/ML IJ SOLN
5000.0000 [IU] | Freq: Three times a day (TID) | INTRAMUSCULAR | Status: DC
  Administered 2011-09-18 – 2011-09-28 (×31): 5000 [IU] via SUBCUTANEOUS
  Filled 2011-09-18 (×33): qty 1

## 2011-09-18 NOTE — H&P (Signed)
Name: Brooke [name] MRN: [id number] DOB: [birth year]    LOS:   PCCM ADMISSION NOTE  History of Present Illness: 71 y.o..WF with known hx of  Bronchiectasis. MRSA colonization . Last sputum cx +Psuedomonas. Presented to clinic with 3 week hx of progressively worsening cough, hemoptysis ,  congestion and dyspnea unresponsive to OP abx. On arrival to office new onset of Hypoxia with sats ~77% (rechecked) . Admitted to SDU on 09/18/2011 .Satn on arrival 96% RA  Lines / Drains:   Cultures: 4/12 BC x 2 > 4/12 Sputum CX >>  Antibiotics: 4/12 Vanc>> 4/12 Cefepime >> 4/12 Cipro >>   Tests / Events:    Past Medical History  Diagnosis Date  . Hypothyroidism   . History of allergy   . Mycobacterium avium complex   . Bronchiectasis     colonized mrsa, s/p vancomyin IV 10 days 5/10 and 7 days 6/10   No past surgical history on file. Prior to Admission medications   Medication Sig Start Date End Date Taking? Authorizing Provider  albuterol (PROAIR HFA) 108 (90 BASE) MCG/ACT inhaler Inhale 2 puffs into the lungs every 6 (six) hours as needed for wheezing. 10/14/10 10/14/11  Storm Frisk, MD  ALPRAZolam Prudy Feeler) 0.5 MG tablet Take 1 tablet (0.5 mg total) by mouth 3 (three) times daily as needed for anxiety. 07/15/11 07/14/12  Storm Frisk, MD  Aspirin-Salicylamide-Caffeine (BC HEADACHE POWDER PO) Take by mouth as needed.    Historical Provider, MD  CENESTIN 0.625 MG tablet Once daily    Historical Provider, MD  citalopram (CELEXA) 20 MG tablet Take 20 mg by mouth daily.     Historical Provider, MD  HYDROcodone-acetaminophen [facility]) 10-325 MG per tablet 1-2 tablets by mouth every 4 hours as needed 08/31/11   Storm Frisk, MD  HYDROcodone-homatropine (HYDROMET) 5-1.5 MG/5ML syrup Take 1 tsp every 4-6 hrs as needed for cough 08/31/11 08/30/12  Storm Frisk, MD  levothyroxine (SYNTHROID, LEVOTHROID) 100 MCG tablet 1/2 tablet once daily    Historical Provider, MD  Multiple Vitamins-Minerals  (MULTIVITAL PO) Take by mouth daily.    Historical Provider, MD  triamterene-hydrochlorothiazide (MAXZIDE) 75-50 MG per tablet 1/2 tablet once daily    Historical Provider, MD   Allergies Allergies  Allergen Reactions  . Doxycycline     vomiting  . Levofloxacin   . Mometasone Furoate     REACTION: swollen throat, thrush  . Penicillins   . Sulfonamide Derivatives     Family History Non contributory   Social History  reports that she quit smoking about 49 years ago. Her smoking use included Cigarettes. She has a 3 pack-year smoking history. She has never used smokeless tobacco. Her alcohol and drug histories not on file.  Review Of Systems  11 points review of systems is negative with an exception of listed in HPI. No results found. No results found for this basename: NA:3,K:3,CL:3,CO2:3,BUN:3,CREATININE:3,GLUCOSE:3 in the last 168 hours No results found for this basename: HGB:3,HCT:3,WBC:3,PLT:3 in the last 168 hours   Vital Signs: Temp:  [99 F (37.2 C)] 99 F (37.2 C) (04/12 1056) Pulse Rate:  [80] 80  (04/12 1056) SpO2:  [77 %] 77 % (04/12 1056) Weight:  [45.813 kg (101 lb)] 45.813 kg (101 lb) (04/12 1056)  b/p 114/64   Physical Examination: Gen: Thin and cachectic white female  ENT: No lesions, mouth clear, oropharynx clear, ++ postnasal drip  Neck: No JVD, no TMG, no carotid bruits  Lungs: No use of accessory muscles,  no dullness to percussion, bilateral rhonchi , poor airflow  Cardiovascular: RRR, heart sounds normal, no murmur or gallops, no peripheral edema  Abdomen: soft and NT, no HSM, BS normal  Musculoskeletal: No deformities, no cyanosis or clubbing  Neuro: alert, non focal  Skin: Warm, no lesions or rashes         Labs and Imaging:  Pending   Assessment and Plan: Acute Respiratory Failure with underlying Bronchiectatic Exacerbation  -admit to SDU  -O2 for sat >90% - pulm hygiene  -IV abx (hx of Psuedomonas and MRSA )  Triple cover with Cefepime,  cipro and vanc  -pan cx  -check PCT /LA / ABG  -cxr pending  Hypothyroid - Cont on home meds.   HTN Hold diuretic for now     Pt sent to [facility] SDU , pt refused EMS transport-advised on dangers of hypoixa. Friend transported pt across the street.  Examined on arrival - appears comfortable, decrased BS BL, green phlegm, Abx reasonable  Best practices / Disposition: -->SDU status under PCCM, can transfer to floor in 12h -->full code -->Heparin for DVT Px -->Protonix for GI Px     PARRETT,TAMMY NP -C  09/18/2011, 11:34 AM   Independently examined pt, evaluated data & formulated above care plan with NP  Erika Hussar V.

## 2011-09-18 NOTE — Plan of Care (Signed)
Problem: Phase I Progression Outcomes Goal: OOB as tolerated unless otherwise ordered Outcome: Completed/Met Date Met:  09/18/11 To [facility]

## 2011-09-18 NOTE — Progress Notes (Signed)
ANTIBIOTIC CONSULT NOTE - INITIAL  Pharmacy Consult for Vancomycin, Cefepime, and Cipro Indication: Acute Respiratory Failure   Allergies  Allergen Reactions  . Doxycycline     vomiting  . Levofloxacin   . Mometasone Furoate     REACTION: swollen throat, thrush  . Penicillins   . Sulfonamide Derivatives     Patient Measurements:   Last known weight 46kg, height 166 cm Adjusted Body Weight:   Vital Signs: Temp: 99 F (37.2 C) (04/12 1056) Temp src: Oral (04/12 1056) Pulse Rate: 80  (04/12 1056) Intake/Output from previous day:    Labs: No results found for this basename: WBC:3,HGB:3,PLT:3,LABCREA:3,CREATININE:3 in the last 72 hours The CrCl is unknown because both a height and weight (above a minimum accepted value) are required for this calculation. No results found for this basename: VANCOTROUGH:2,VANCOPEAK:2,VANCORANDOM:2,GENTTROUGH:2,GENTPEAK:2,GENTRANDOM:2,TOBRATROUGH:2,TOBRAPEAK:2,TOBRARND:2,AMIKACINPEAK:2,AMIKACINTROU:2,AMIKACIN:2, in the last 72 hours   Microbiology: No results found for this or any previous visit (from the past 720 hour(s)).  Medical History: Past Medical History  Diagnosis Date  . Hypothyroidism   . History of allergy   . Mycobacterium avium complex   . Bronchiectasis     colonized mrsa, s/p vancomyin IV 10 days 5/10 and 7 days 6/10    Medications:  Anti-infectives    None     Assessment:  70 yo F admit with acute respiratory failure, underlying bronchiectasis  History of Pseudomonas and MRSA  Noted abx allergies:  Doxycycline (vomitting), Levofloxacin (has tolerated Cipro as outpatient), PCN, sulfa  Renal function has been normal in the past (CrCl ~45 ml/min), Labs today pending  Goal of Therapy:  Vancomycin trough level 15-20 mcg/ml  Plan:   Vancomycin 750mg  IV q24h  Cefepime 2g IV q24h  Cipro 400mg  IV q12h  Measure Vanc trough at steady state.  Follow up renal fxn and culture results.     Lynann Beaver  PharmD, BCPS Pager [phone] 09/18/2011 1:01 PM

## 2011-09-18 NOTE — Progress Notes (Signed)
Subjective:    Patient ID: Brooke [name], female    DOB: 1941-01-19, 71 y.o.   MRN: [id number]  HPI  71 y.o..WF with Bronchiectasis. MRSA colonization   10/12 Notes more cough, productive yellow,  now is green.  No real chest pain.  Has to hold ribs if coughs.  Notes more severe rubbing pain.  Notes more dyspnea and weakness. Not able to eat.  Notes N/V and no appt.  ENT rx cefdinir.   07/15/11 Slowly worse for one month, then now 5 days of unable to get up mucus, nose is stopped up, ears stopped up.  Mucus is small in amount.  Bilious green.       08/31/2011 At last ov we Rx 14days cipro. Pt notes more cough and chest congestion.  Now with sinus pressure, drainage, R ear drains, bloody, yellow green.  Also coughing up yellow to beige green.   Chest pain as before. >>Doxycycline x 10   09/18/2011 Acute OV  Does not feel any better with cough /congestion w/ hemoptysis. On arrival O2 sats 77% on room air.  >Admit to SDU at [facility]     Past Medical History  Diagnosis Date  . Hypothyroidism   . History of allergy   . Mycobacterium avium complex   . Bronchiectasis     colonized mrsa, s/p vancomyin IV 10 days 5/10 and 7 days 6/10     No family history on file.   History   Social History  . Marital Status: Widowed    Spouse Name: N/A    Number of Children: N/A  . Years of Education: N/A   Occupational History  . Not on file.   Social History Main Topics  . Smoking status: Former Smoker -- 1.0 packs/day for 3 years    Types: Cigarettes    Quit date: 06/08/1962  . Smokeless tobacco: Never Used  . Alcohol Use: Not on file  . Drug Use: Not on file  . Sexually Active: Not on file   Other Topics Concern  . Not on file   Social History Narrative  . No narrative on file     Allergies  Allergen Reactions  . Doxycycline     vomiting  . Levofloxacin   . Mometasone Furoate     REACTION: swollen throat, thrush  . Penicillins   . Sulfonamide Derivatives      Outpatient  Prescriptions Prior to Visit  Medication Sig Dispense Refill  . albuterol (PROAIR HFA) 108 (90 BASE) MCG/ACT inhaler Inhale 2 puffs into the lungs every 6 (six) hours as needed for wheezing.  1 Inhaler  6  . ALPRAZolam (XANAX) 0.5 MG tablet Take 1 tablet (0.5 mg total) by mouth 3 (three) times daily as needed for anxiety.  90 tablet  4  . Aspirin-Salicylamide-Caffeine (BC HEADACHE POWDER PO) Take by mouth as needed.      . CENESTIN 0.625 MG tablet Once daily      . citalopram (CELEXA) 20 MG tablet Take 20 mg by mouth daily.       Marland Kitchen HYDROcodone-acetaminophen (NORCO) 10-325 MG per tablet 1-2 tablets by mouth every 4 hours as needed  60 tablet  3  . HYDROcodone-homatropine (HYDROMET) 5-1.5 MG/5ML syrup Take 1 tsp every 4-6 hrs as needed for cough  180 mL  3  . levothyroxine (SYNTHROID, LEVOTHROID) 100 MCG tablet 1/2 tablet once daily      . Multiple Vitamins-Minerals (MULTIVITAL PO) Take by mouth daily.      Marland Kitchen  triamterene-hydrochlorothiazide (MAXZIDE) 75-50 MG per tablet 1/2 tablet once daily          Review of Systems  Constitutional:   No  weight loss, night sweats,  + Fevers, chills, fatigue, lassitude. HEENT:   No headaches,  Difficulty swallowing,  Tooth/dental problems,  Sore throat,                No sneezing, itching, ear ache, nasal congestion, post nasal drip,   CV:  Notes  chest pain,  Orthopnea, PND, swelling in lower extremities, anasarca, dizziness, palpitations  GI  No heartburn, indigestion, abdominal pain, ++ nausea,  Neg  diarrhea, change in bowel habits, loss of appetite  Resp: Notes  shortness of breath with exertion and  at rest.  Notes  excess mucus, notes  productive cough,  No non-productive cough,  No coughing up of blood.  Notes  change in color of mucus.  Notes  wheezing.  No chest wall deformity  Skin: no rash or lesions.  GU: no dysuria, change in color of urine, no urgency or frequency.  No flank pain.  MS:  No joint pain or swelling.  No decreased range of  motion.  No back pain.  Psych:  No change in mood or affect. No depression or anxiety.  No memory loss.     Objective:   Physical Exam  Filed Vitals:   09/18/11 1056  Pulse: 80  Temp: 99 F (37.2 C)  TempSrc: Oral  Height: 5' 5.5" (1.664 m)  Weight: 101 lb (45.813 kg)    Gen: Thin and cachectic white female  ENT: No lesions,  mouth clear,  oropharynx clear, ++ postnasal drip, purulent nares  Neck: No JVD, no TMG, no carotid bruits  Lungs: No use of accessory muscles, no dullness to percussion, bilateral rhonchi , poor airflow  Cardiovascular: RRR, heart sounds normal, no murmur or gallops, no peripheral edema  Abdomen: soft and NT, no HSM,  BS normal  Musculoskeletal: No deformities, no cyanosis or clubbing  Neuro: alert, non focal  Skin: Warm, no lesions or rashes,  Site of picc line not infected, edema around elbow is fluid should resolve        Assessment & Plan:   No problem-specific assessment & plan notes found for this encounter.   Updated Medication List Outpatient Encounter Prescriptions as of 09/18/2011  Medication Sig Dispense Refill  . albuterol (PROAIR HFA) 108 (90 BASE) MCG/ACT inhaler Inhale 2 puffs into the lungs every 6 (six) hours as needed for wheezing.  1 Inhaler  6  . ALPRAZolam (XANAX) 0.5 MG tablet Take 1 tablet (0.5 mg total) by mouth 3 (three) times daily as needed for anxiety.  90 tablet  4  . Aspirin-Salicylamide-Caffeine (BC HEADACHE POWDER PO) Take by mouth as needed.      . CENESTIN 0.625 MG tablet Once daily      . citalopram (CELEXA) 20 MG tablet Take 20 mg by mouth daily.       Marland Kitchen HYDROcodone-acetaminophen (NORCO) 10-325 MG per tablet 1-2 tablets by mouth every 4 hours as needed  60 tablet  3  . HYDROcodone-homatropine (HYDROMET) 5-1.5 MG/5ML syrup Take 1 tsp every 4-6 hrs as needed for cough  180 mL  3  . levothyroxine (SYNTHROID, LEVOTHROID) 100 MCG tablet 1/2 tablet once daily      . Multiple Vitamins-Minerals (MULTIVITAL PO)  Take by mouth daily.      Marland Kitchen triamterene-hydrochlorothiazide (MAXZIDE) 75-50 MG per tablet 1/2 tablet once daily

## 2011-09-18 NOTE — Progress Notes (Signed)
CARE MANAGE MENT UTILIZATION REVIEW NOTE 09/18/2011     Patient:  Brooke [name], Brooke [name]   Account Number:  [id number]  Documented by:  [id number] Addilee Neu   Per Ur Regulation Reviewed for med. necessity/level of care/duration of stay

## 2011-09-18 NOTE — Assessment & Plan Note (Signed)
Admit  

## 2011-09-19 ENCOUNTER — Inpatient Hospital Stay (HOSPITAL_COMMUNITY): Payer: Medicare Other

## 2011-09-19 DIAGNOSIS — J189 Pneumonia, unspecified organism: Principal | ICD-10-CM

## 2011-09-19 DIAGNOSIS — E039 Hypothyroidism, unspecified: Secondary | ICD-10-CM

## 2011-09-19 LAB — URINE MICROSCOPIC-ADD ON

## 2011-09-19 LAB — CBC
MCHC: 33.7 g/dL (ref 30.0–36.0)
Platelets: 422 10*3/uL — ABNORMAL HIGH (ref 150–400)
RDW: 14.1 % (ref 11.5–15.5)
WBC: 10.3 10*3/uL (ref 4.0–10.5)

## 2011-09-19 LAB — BASIC METABOLIC PANEL
Chloride: 100 mEq/L (ref 96–112)
Creatinine, Ser: 0.95 mg/dL (ref 0.50–1.10)
GFR calc Af Amer: 67 mL/min — ABNORMAL LOW (ref >90)
GFR calc non Af Amer: 58 mL/min — ABNORMAL LOW (ref >90)
Potassium: 3.1 mEq/L — ABNORMAL LOW (ref 3.5–5.1)

## 2011-09-19 LAB — URINALYSIS, ROUTINE W REFLEX MICROSCOPIC
Nitrite: NEGATIVE
Protein, ur: NEGATIVE mg/dL
Specific Gravity, Urine: 1.012 (ref 1.005–1.030)
Urobilinogen, UA: 0.2 mg/dL (ref 0.0–1.0)

## 2011-09-19 LAB — CLOSTRIDIUM DIFFICILE BY PCR: Toxigenic C. Difficile by PCR: NEGATIVE

## 2011-09-19 MED ORDER — FAMOTIDINE IN NACL 20-0.9 MG/50ML-% IV SOLN: 20.0000 mg | INTRAVENOUS | Status: DC

## 2011-09-19 MED ORDER — POTASSIUM CHLORIDE CRYS ER 20 MEQ PO TBCR
40.0000 meq | EXTENDED_RELEASE_TABLET | Freq: Once | ORAL | Status: AC
  Administered 2011-09-19: 40 meq via ORAL
  Filled 2011-09-19: qty 2

## 2011-09-19 MED ORDER — FAMOTIDINE 20 MG PO TABS
20.0000 mg | ORAL_TABLET | Freq: Every day | ORAL | Status: DC
  Administered 2011-09-19 – 2011-09-27 (×9): 20 mg via ORAL
  Filled 2011-09-19 (×12): qty 1

## 2011-09-19 NOTE — Progress Notes (Signed)
eLink Physician-Brief Progress Note Patient Name: OVELLA MANYGOATS DOB: 05/13/1941 MRN: [id number]  Date of Service  09/19/2011   HPI/Events of Note  Hypokalemia   eICU Interventions  Potassium replaced   Intervention Category Minor Interventions: Electrolytes abnormality - evaluation and management  Fontaine Hehl 09/19/2011, 5:16 AM

## 2011-09-19 NOTE — Progress Notes (Signed)
Name: Brooke [name] MRN: [id number] DOB: 1940/12/30    LOS: 1  PCCM Service   Brief patient profile:  71 yowf  with   Bronchiectasis. MRSA colonization . Last sputum cx +Psuedomonas. Presented to clinic with 3 week hx of progressively worsening cough, hemoptysis ,  congestion and dyspnea unresponsive to OP abx. On arrival to office new onset of Hypoxia with sats ~77% (rechecked) . Admitted to SDU on 4/12  Satn on arrival 96% RA  Lines / Drains:   Cultures: MRSA screen 4/12 > POS 4/12 BC x 2 > 4/12 Sputum CX >> 4/13 Stool c diff pcr >>>   Antibiotics: 4/12 Vanc(HCAP)>> 4/12 Cefepime(HCAP)>> 4/12 Cipro(HCAP)>>   Tests / Events:  Review Of Systems  11 points review of systems is negative with an exception of listed in HPI. Dg Chest Port 1 View  09/19/2011  *RADIOLOGY REPORT*  Clinical Data: Former smoker with COPD and weight loss.  PORTABLE CHEST - 1 VIEW  Comparison: Radiographs 03/21/2011 and 09/18/2011.  CT 10/09/2008.  Findings: 0548 hours.  The heart size and mediastinal contours are stable.  There is stable apical pleural thickening and a left basilar granuloma.  There is a developing ill-defined focal airspace opacity peripherally in the right mid lung.  Patchy basilar opacities appear unchanged.  IMPRESSION: Developing focal airspace disease peripherally in the mid right lung suspicious for pneumonia.  As not seen on yesterday's study, this is unlikely to reflect a mass, although requires followup to document resolution.  Original Report Authenticated By: Gerrianne Scale, M.D.   Portable Chest Xray  09/18/2011  *RADIOLOGY REPORT*  Clinical Data: Bronchiectasis, hemoptysis.  PORTABLE CHEST - 1 VIEW  Comparison: 03/21/2011  Findings: Severe COPD and scarring/bronchiectasis changes in the lungs. No change since prior study.  No acute opacities or effusions.  Calcified granuloma the left lung base.  Heart is borderline in size.  IMPRESSION: Severe COPD and chronic lung changes.   Original Report Authenticated By: Cyndie Chime, M.D.    Lab 09/19/11 0347 09/18/11 1252  NA 136 138  K 3.1* 3.5  CL 100 99  CO2 28 30  BUN 10 9  CREATININE 0.95 0.90  GLUCOSE 129* 84    Lab 09/19/11 0347 09/18/11 1252  HGB 9.3* 11.2*  HCT 27.6* 34.8*  WBC 10.3 9.3  PLT 422* 496*     Vital Signs: BP 90/37  Pulse 84  Temp(Src) 98.1 F (36.7 C) (Oral)  Resp 15  Ht 5\' 5"  (1.651 m)  Wt 100 lb 14.4 oz (45.768 kg)  BMI 16.79 kg/m2  SpO2 100%  On RA  Physical Examination: Gen: Thin and cachectic very anxious white female  ENT: No lesions, mouth clear, oropharynx clear, ++ postnasal drip  Neck: No JVD, no TMG, no carotid bruits  Lungs: No use of accessory muscles, no dullness to percussion, bilateral rhonchi , poor airflow  Cardiovascular: RRR, heart sounds normal, no murmur or gallops, no peripheral edema  Abdomen: soft and NT, no HSM, BS normal  Musculoskeletal: No deformities, no cyanosis or clubbing  Neuro: alert, non focal  Skin: Warm, no lesions or rashes             Assessment and Plan: Acute Respiratory Failure with underlying Bronchiectatic Exacerbation > prob RML pna   -O2 for sat >90% - pulm hygiene  -IV abx (hx of Psuedomonas and MRSA ) per dashboard Triple cover with Cefepime, cipro and vanc  -pan cx d -check PCT /LA / ABG  Hypothyroid - Cont on home meds.   HTN Hold diuretic for now  Diarrhea - Acute onseet 3 d pta high risk c diff  4/13: clear liquid, change ppi to h2, check stool      Best practices / Disposition: -->  transfer to floor 4/13 -->full code -->Heparin for DVT Px -->Pepcid  for GI Px   [name], MD Pulmonary and Critical Care Medicine [facility] Cell [phone]

## 2011-09-19 NOTE — H&P (Deleted)
Name: Brooke [name] MRN: [id number] DOB: 12/08/1940    LOS: 1  PCCM Service   Brief patient profile:  71 yowf  with   Bronchiectasis. MRSA colonization . Last sputum cx +Psuedomonas. Presented to clinic with 3 week hx of progressively worsening cough, hemoptysis ,  congestion and dyspnea unresponsive to OP abx. On arrival to office new onset of Hypoxia with sats ~77% (rechecked) . Admitted to SDU on 4/12  Satn on arrival 96% RA  Lines / Drains:   Cultures: MRSA screen 4/12 > POS 4/12 BC x 2 > 4/12 Sputum CX >> 4/13 Stool c diff pcr >>>   Antibiotics: 4/12 Vanc(HCAP)>> 4/12 Cefepime(HCAP)>> 4/12 Cipro(HCAP)>>   Tests / Events:  Review Of Systems  11 points review of systems is negative with an exception of listed in HPI. Dg Chest Port 1 View  09/19/2011  *RADIOLOGY REPORT*  Clinical Data: Former smoker with COPD and weight loss.  PORTABLE CHEST - 1 VIEW  Comparison: Radiographs 03/21/2011 and 09/18/2011.  CT 10/09/2008.  Findings: 0548 hours.  The heart size and mediastinal contours are stable.  There is stable apical pleural thickening and a left basilar granuloma.  There is a developing ill-defined focal airspace opacity peripherally in the right mid lung.  Patchy basilar opacities appear unchanged.  IMPRESSION: Developing focal airspace disease peripherally in the mid right lung suspicious for pneumonia.  As not seen on yesterday's study, this is unlikely to reflect a mass, although requires followup to document resolution.  Original Report Authenticated By: WILLIAM B. VEAZEY, M.D.   Portable Chest Xray  09/18/2011  *RADIOLOGY REPORT*  Clinical Data: Bronchiectasis, hemoptysis.  PORTABLE CHEST - 1 VIEW  Comparison: 03/21/2011  Findings: Severe COPD and scarring/bronchiectasis changes in the lungs. No change since prior study.  No acute opacities or effusions.  Calcified granuloma the left lung base.  Heart is borderline in size.  IMPRESSION: Severe COPD and chronic lung changes.   Original Report Authenticated By: KEVIN G. DOVER, M.D.    Lab 09/19/11 0347 09/18/11 1252  NA 136 138  K 3.1* 3.5  CL 100 99  CO2 28 30  BUN 10 9  CREATININE 0.95 0.90  GLUCOSE 129* 84    Lab 09/19/11 0347 09/18/11 1252  HGB 9.3* 11.2*  HCT 27.6* 34.8*  WBC 10.3 9.3  PLT 422* 496*     Vital Signs: BP 90/37  Pulse 84  Temp(Src) 98.1 F (36.7 C) (Oral)  Resp 15  Ht 5' 5" (1.651 m)  Wt 100 lb 14.4 oz (45.768 kg)  BMI 16.79 kg/m2  SpO2 100%  On RA  Physical Examination: Gen: Thin and cachectic very anxious white female  ENT: No lesions, mouth clear, oropharynx clear, ++ postnasal drip  Neck: No JVD, no TMG, no carotid bruits  Lungs: No use of accessory muscles, no dullness to percussion, bilateral rhonchi , poor airflow  Cardiovascular: RRR, heart sounds normal, no murmur or gallops, no peripheral edema  Abdomen: soft and NT, no HSM, BS normal  Musculoskeletal: No deformities, no cyanosis or clubbing  Neuro: alert, non focal  Skin: Warm, no lesions or rashes             Assessment and Plan: Acute Respiratory Failure with underlying Bronchiectatic Exacerbation > prob RML pna   -O2 for sat >90% - pulm hygiene  -IV abx (hx of Psuedomonas and MRSA ) per dashboard Triple cover with Cefepime, cipro and vanc  -pan cx d -check PCT /LA / ABG       Hypothyroid - Cont on home meds.   HTN Hold diuretic for now  Diarrhea - Acute onseet 3 d pta high risk c diff  4/13: clear liquid, change ppi to h2, check stool      Best practices / Disposition: -->  transfer to floor 4/13 -->full code -->Heparin for DVT Px -->Pepcid  for GI Px   Caniya Tagle, MD Pulmonary and Critical Care Medicine [facility] Cell [phone]   

## 2011-09-20 NOTE — Progress Notes (Signed)
Name: Brooke [name] MRN: [id number] DOB: 01-07-1941    LOS: 2  PCCM Service   Brief patient profile:  71 yowf  with   Bronchiectasis. MRSA colonization . Last sputum cx +Psuedomonas. Presented to clinic with 3 week hx of progressively worsening cough, hemoptysis ,  congestion and dyspnea unresponsive to OP abx. On arrival to office new onset of Hypoxia with sats ~77% (rechecked) . Admitted to SDU on 4/12  Satn on arrival 96% RA  Lines / Drains:   Micro/sepsis: Procalcitonin 4/12:  < .30 MRSA screen 4/12 > POS 4/12 BC x 2 > 4/13 Sputum CX >> 4/13 Stool c diff pcr > neg   Antibiotics: 4/12 Vanc(HCAP)>> 4/12 Cefepime(HCAP)>> 4/12 Cipro(HCAP)>>   Tests / Events:   Dg Chest Port 1 View  09/19/2011  *RADIOLOGY REPORT*  Clinical Data: Former smoker with COPD and weight loss.  PORTABLE CHEST - 1 VIEW  Comparison: Radiographs 03/21/2011 and 09/18/2011.  CT 10/09/2008.  Findings: 0548 hours.  The heart size and mediastinal contours are stable.  There is stable apical pleural thickening and a left basilar granuloma.  There is a developing ill-defined focal airspace opacity peripherally in the right mid lung.  Patchy basilar opacities appear unchanged.  IMPRESSION: Developing focal airspace disease peripherally in the mid right lung suspicious for pneumonia.  As not seen on yesterday's study, this is unlikely to reflect a mass, although requires followup to document resolution.  Original Report Authenticated By: Gerrianne Scale, M.D.   Portable Chest Xray  09/18/2011  *RADIOLOGY REPORT*  Clinical Data: Bronchiectasis, hemoptysis.  PORTABLE CHEST - 1 VIEW  Comparison: 03/21/2011  Findings: Severe COPD and scarring/bronchiectasis changes in the lungs. No change since prior study.  No acute opacities or effusions.  Calcified granuloma the left lung base.  Heart is borderline in size.  IMPRESSION: Severe COPD and chronic lung changes.  Original Report Authenticated By: Cyndie Chime, M.D.     Lab 09/19/11 0347 09/18/11 1252  NA 136 138  K 3.1* 3.5  CL 100 99  CO2 28 30  BUN 10 9  CREATININE 0.95 0.90  GLUCOSE 129* 84    Lab 09/19/11 0347 09/18/11 1252  HGB 9.3* 11.2*  HCT 27.6* 34.8*  WBC 10.3 9.3  PLT 422* 496*     Vital Signs: BP 100/65  Pulse 80  Temp(Src) 98.2 F (36.8 C) (Oral)  Resp 18  Ht 5\' 5"  (1.651 m)  Wt 100 lb 14.4 oz (45.768 kg)  BMI 16.79 kg/m2  SpO2 95%  On RA  Physical Examination: Gen: Thin and cachectic very anxious white female  ENT: No lesions, mouth clear, oropharynx clear Neck: No JVD, no TMG, no carotid bruits  Lungs: No use of accessory muscles, no dullness to percussion, min bilateral exp rhonchi  Cardiovascular: RRR, heart sounds normal, no murmur or gallops, no peripheral edema  Abdomen: soft and NT, no HSM, BS normal  Musculoskeletal: No deformities, no cyanosis or clubbing  Neuro: alert, non focal  Skin: Warm, no lesions or rashes             Assessment and Plan: Acute Respiratory Failure with underlying Bronchiectatic Exacerbation > prob RML pna   -O2 for sat >90% - pulm hygiene  -IV abx (hx of Psuedomonas and MRSA ) per dashboard Triple cover with Cefepime, cipro and vanc       Hypothyroid - Cont on home meds.   HTN Hold diuretic for now  Diarrhea - Acute onseet 3 d pta  high risk c diff  4/13: clear liquid, change ppi to h2 > better 4/14 so try liquids but no dairy products   Best practices / Disposition: --> floor status since 4/13 -->full code -->Heparin for DVT Px -->Pepcid  for GI Px   [name], MD Pulmonary and Critical Care Medicine [facility] Cell [phone]

## 2011-09-21 ENCOUNTER — Telehealth: Payer: Self-pay | Admitting: Critical Care Medicine

## 2011-09-21 DIAGNOSIS — M109 Gout, unspecified: Secondary | ICD-10-CM

## 2011-09-21 DIAGNOSIS — J189 Pneumonia, unspecified organism: Secondary | ICD-10-CM

## 2011-09-21 DIAGNOSIS — J471 Bronchiectasis with (acute) exacerbation: Secondary | ICD-10-CM

## 2011-09-21 LAB — VANCOMYCIN, TROUGH: Vancomycin Tr: 6.2 ug/mL — ABNORMAL LOW (ref 10.0–20.0)

## 2011-09-21 LAB — CULTURE, RESPIRATORY W GRAM STAIN

## 2011-09-21 MED ORDER — ENSURE COMPLETE PO LIQD
237.0000 mL | Freq: Two times a day (BID) | ORAL | Status: DC
  Administered 2011-09-21 – 2011-09-28 (×11): 237 mL via ORAL

## 2011-09-21 MED ORDER — CHLORHEXIDINE GLUCONATE CLOTH 2 % EX PADS
6.0000 | MEDICATED_PAD | Freq: Every day | CUTANEOUS | Status: AC
  Administered 2011-09-24 – 2011-09-25 (×2): 6 via TOPICAL

## 2011-09-21 MED ORDER — HYDROCODONE-ACETAMINOPHEN 10-325 MG PO TABS
1.0000 | ORAL_TABLET | ORAL | Status: DC | PRN
  Administered 2011-09-21 – 2011-09-28 (×20): 1 via ORAL
  Filled 2011-09-21 (×21): qty 1

## 2011-09-21 MED ORDER — IBUPROFEN 800 MG PO TABS: 400.0000 mg | ORAL_TABLET | Freq: Four times a day (QID) | ORAL | Status: DC | PRN

## 2011-09-21 MED ORDER — MUPIROCIN 2 % EX OINT
1.0000 "application " | TOPICAL_OINTMENT | Freq: Two times a day (BID) | CUTANEOUS | Status: AC
  Administered 2011-09-21 – 2011-09-26 (×10): 1 via NASAL
  Filled 2011-09-21: qty 22

## 2011-09-21 MED ORDER — SACCHAROMYCES BOULARDII 250 MG PO CAPS
250.0000 mg | ORAL_CAPSULE | Freq: Two times a day (BID) | ORAL | Status: DC
  Administered 2011-09-21 – 2011-09-28 (×15): 250 mg via ORAL
  Filled 2011-09-21 (×16): qty 1

## 2011-09-21 NOTE — Clinical Documentation Improvement (Signed)
    MALNUTRITION DOCUMENTATION CLARIFICATION  THIS DOCUMENT IS NOT A PERMANENT PART OF THE MEDICAL RECORD  TO RESPOND TO THE THIS QUERY, FOLLOW THE INSTRUCTIONS BELOW:  1. If needed, update documentation for the patient's encounter via the notes activity.  2. Access this query again and click edit on the In [employer].  3. After updating, or not, click F2 to complete all highlighted (required) fields concerning your review. Select "additional documentation in the medical record" OR "no additional documentation provided".  4. Click Sign note button.  5. The deficiency will fall out of your In Basket *Please let us know if you are not able to complete this workflow by phone or e-mail (listed below).  Please update your documentation within the medical record to reflect your response to this query.                                                                                        09/21/11   Dear [name],  In a better effort to capture your patient's severity of illness, reflect appropriate length of stay and utilization of resources, a review of the patient medical record has revealed the following indicators.    Based on your clinical judgment, please clarify and document in a progress note and/or discharge summary the clinical condition associated with the following supporting information:  In responding to this query please exercise your independent judgment.  The fact that a query is asked, does not imply that any particular answer is desired or expected.  Pt admitted with Acute Pesp Failure  According to pn 09/19/11 pt "thin and cachectic" in setting of BMI=17.5   Please clarify if "cachectic" can be further specified as one of the diagnosis listed below and document in pn and d/c summary.   Possible Clinical Conditions?  ________Severe Malnutrition   _______Protein Calorie Malnutrition _______Severe Protein Calorie Malnutrition  _______Other  Condition________________ _______Cannot clinically determine     Supporting Information: Risk Factors: Acute Resp Failure, RML PNA, MRSA, Pseudomonas, Diarrhea, cachectic   Signs & Symptoms: BMI-17.5 5'5"/105lbs  Treatment  monitoring   You may use possible, probable, or suspect with inpatient documentation. possible, probable, suspected diagnoses MUST be documented at the time of discharge  Reviewed: additional documentation in the medical record ljh.    Thank You,  Enis Slipper RN, BSN, CCDS Clinical Documentation Specialist [facility] Dept Pager: [phone] / E-mail: [email or web address]  Health Information Management [place]

## 2011-09-21 NOTE — Progress Notes (Signed)
Name: Brooke [name] MRN: [id number] DOB: [birth year]    LOS: 3  PCCM Service   Brief patient profile:  71 yowf  with   Bronchiectasis. MRSA colonization . Last sputum cx +Psuedomonas. Presented to clinic with 3 week hx of progressively worsening cough, hemoptysis ,  congestion and dyspnea unresponsive to OP abx. On arrival to office new onset of Hypoxia with sats ~77% (rechecked) . Admitted to SDU on 4/12  Satn on arrival 96% RA.  Micro/sepsis: Procalcitonin 4/12:  < .30 MRSA screen 4/12 > POS 4/12 BC x 2 > 4/13 Sputum CX >>nl flora 4/13 Stool c diff pcr > neg  Antibiotics: 4/12 Vanc(HCAP)>>4/15 4/12 Cefepime(HCAP)>> 4/12 Cipro(HCAP)>>  Tests / Events:  Subjective: Still has cough.  Decreased sputum.  Has not coughed up blood for two days.  C/o pain in Lt 1st toe.  Feels hungry.   Vital Signs: BP 92/57  Pulse 63  Temp(Src) 97.3 F (36.3 C) (Oral)  Resp 18  Ht 5\' 5"  (1.651 m)  Wt 100 lb 1.6 oz (45.405 kg)  BMI 16.66 kg/m2  SpO2 96%  On RA  Physical Examination: General - no distress, cachetic HEENT - no sinus tenderness Cardiac - s1s2 no murmur Chest - basilar crackles, no wheeze Abd - soft, notender Ext - no edema, 1st metatarsal joint swollen/red Neuro - normal strength Psych - normal mood, behavior  CBC    Component Value Date/Time   WBC 10.3 09/19/2011 0347   RBC 3.18* 09/19/2011 0347   HGB 9.3* 09/19/2011 0347   HCT 27.6* 09/19/2011 0347   PLT 422* 09/19/2011 0347   MCV 86.8 09/19/2011 0347   MCH 29.2 09/19/2011 0347   MCHC 33.7 09/19/2011 0347   RDW 14.1 09/19/2011 0347   LYMPHSABS 1.8 09/18/2011 1252   MONOABS 0.8 09/18/2011 1252   EOSABS 0.2 09/18/2011 1252   BASOSABS 0.0 09/18/2011 1252    BMET    Component Value Date/Time   NA 136 09/19/2011 0347   K 3.1* 09/19/2011 0347   CL 100 09/19/2011 0347   CO2 28 09/19/2011 0347   GLUCOSE 129* 09/19/2011 0347   BUN 10 09/19/2011 0347   CREATININE 0.73 09/21/2011 0450   CALCIUM 7.8* 09/19/2011 0347   GFRNONAA 83*  09/21/2011 0450   GFRAA >90 09/21/2011 0450    No results found.  Assessment and Plan:  Acute respiratory failure in setting of probable Rt sided PNA with BTX.  Has prior hx of MRSA, Pseudomonas -D/C vancomycin 4/15 -continue cefepime, cipro -f/u CXR -titrate FiO2 to keep SpO2 > 90% -continue scheduled nebulizer tx  Hypothyroid -continue synthroid  Hx of HTN -Hold diuretic for now with relatively low BP  Diarrhea -C diff PCR negative -will advance diet as tolerated and monitor  Protein calorie malnutrition -add ensure supplementas  1st metatarsal swelling on Lt foot>>Likely gout -Add NSAIDS and monitor  Anemia -f/u CBC intermittent  Hypokalemia -f/u BMET   Brett Canales Minor ACNP [facility] Pager [phone] till 3 pm If no answer page [phone] 09/21/2011, 1:30 PM  Reviewed above, examined paitent, and agree with assessment/plan.  Coralyn Helling, MD 09/21/2011, 2:06 PM Pager:  [phone]

## 2011-09-21 NOTE — Progress Notes (Signed)
INITIAL ADULT NUTRITION ASSESSMENT Date: 09/21/2011   Time: 11:44 AM Reason for Assessment: Nutrition risk   ASSESSMENT: Female 71 y.o.  Dx: Acute respiratory failure  Hx:  Past Medical History  Diagnosis Date  . Hypothyroidism   . History of allergy   . Mycobacterium avium complex   . Bronchiectasis     colonized mrsa, s/p vancomyin IV 10 days 5/10 and 7 days 6/10  . Pneumonia   . Headache    Related Meds:  Scheduled Meds:   . albuterol  2.5 mg Nebulization Q6H  . ceFEPime (MAXIPIME) IV  2 g Intravenous Q24H  . ciprofloxacin  400 mg Intravenous Q12H  . citalopram  20 mg Oral Daily  . estrogens (conjugated)  0.625 mg Oral Daily  . famotidine  20 mg Oral QHS  . heparin  5,000 Units Subcutaneous Q8H  . ipratropium  0.5 mg Nebulization Q6H  . levothyroxine  50 mcg Oral QAC breakfast  . sodium chloride  3 mL Intravenous Q12H  . vancomycin  750 mg Intravenous Q24H   Continuous Infusions:  PRN Meds:.sodium chloride, sodium chloride, albuterol, ALPRAZolam, HYDROcodone-acetaminophen, sodium chloride  Ht: 5\' 5"  (165.1 cm)  Wt: 100 lb 1.6 oz (45.405 kg)  Ideal Wt: 125 lb % Ideal Wt: 80  Wt Readings from Last 5 Encounters:  09/20/11 100 lb 1.6 oz (45.405 kg)  09/18/11 101 lb (45.813 kg)  08/31/11 105 lb 3.2 oz (47.718 kg)  07/15/11 101 lb 3.2 oz (45.904 kg)  03/20/11 102 lb 6.4 oz (46.448 kg)    Usual Wt: 100-105 lb % Usual Wt: 100  Body mass index is 16.66 kg/(m^2).  Food/Nutrition Related Hx: Pt reports eating well PTA with great appetite. Pt with diarrhea for 3 days PTA, negative for C. Difficile by PCR on 09/19/11. Pt states the diarrhea comes on all of a sudden, without warning/ Pt repeatedly expressed frustration with her diet and c/o being hungry r/t limited food choices on full liquid diet with no dairy products and is eager to have her diet advanced. Pt on Ensure Plus PTA however pt stated MD did not want her to have this.   DG of chest on 09/19/11  showed: Developing focal airspace disease peripherally in the mid right  lung suspicious for pneumonia. As not seen on yesterday's study,  this is unlikely to reflect a mass, although requires followup to  document resolution.  Labs:  CMP     Component Value Date/Time   NA 136 09/19/2011 0347   K 3.1* 09/19/2011 0347   CL 100 09/19/2011 0347   CO2 28 09/19/2011 0347   GLUCOSE 129* 09/19/2011 0347   BUN 10 09/19/2011 0347   CREATININE 0.73 09/21/2011 0450   CALCIUM 7.8* 09/19/2011 0347   PROT 7.3 09/18/2011 1252   ALBUMIN 2.8* 09/18/2011 1252   AST 20 09/18/2011 1252   ALT 7 09/18/2011 1252   ALKPHOS 127* 09/18/2011 1252   BILITOT 0.1* 09/18/2011 1252   GFRNONAA 83* 09/21/2011 0450   GFRAA >90 09/21/2011 0450    Intake/Output Summary (Last 24 hours) at 09/21/11 1152 Last data filed at 09/21/11 0900  Gross per 24 hour  Intake 1240.33 ml  Output   1150 ml  Net  90.33 ml   Last BM - 09/21/11   Diet Order: Full Liquid   IVF:    Estimated Nutritional Needs:   Kcal:1700-2000 Protein:70-85g Fluid:1.7-2L  NUTRITION DIAGNOSIS: -Inadequate oral intake (NI-2.1).  Status: Ongoing -Suspect pt with at least moderate PCM of chronic  illness AEB mild to severe muscle and fat loss despite reported excellent intake and stable weight PTA  RELATED TO: limited food choices   AS EVIDENCE BY: full liquid no dairy diet  MONITORING/EVALUATION(Goals): Advance diet as tolerated to low fiber diet.   EDUCATION NEEDS: -Education not appropriate at this time  INTERVENTION: Diet advancement per MD. Recommend Florastor to help with diarrhea. Unclear why MD does not allow Ensure Plus as that is the only supplement pt would like to have and it is lactose-free, recommend allowing this for additional nutrition. Will monitor.   Dietitian #: [phone]  DOCUMENTATION CODES Per approved criteria  -Underweight    Marshall Cork 09/21/2011, 11:44 AM

## 2011-09-21 NOTE — Progress Notes (Signed)
ANTIBIOTIC CONSULT NOTE - FOLLOW UP  Pharmacy Consult for Vancomycin/Cefepime/Cipro Indication: Pneumonia, acute respiratory failure  Allergies  Allergen Reactions  . Doxycycline     vomiting  . Levofloxacin Other (See Comments)    Unknown rxn.  Tolerates Cipro  . Mometasone Furoate     REACTION: swollen throat, thrush  . Penicillins   . Sulfonamide Derivatives     Patient Measurements: Height: 5\' 5"  (165.1 cm) Weight: 100 lb 1.6 oz (45.405 kg) IBW/kg (Calculated) : 57    Vital Signs: Temp: 97.3 F (36.3 C) (04/15 0616) Temp src: Oral (04/15 0616) BP: 92/57 mmHg (04/15 0616) Pulse Rate: 63  (04/15 0616) Intake/Output from previous day: 04/14 0701 - 04/15 0700 In: 1600.3 [P.O.:960; I.V.:240.3; IV Piggyback:400] Out: 1300 [Urine:1300] Intake/Output from this shift:    Labs:  Basename 09/21/11 0450 09/19/11 0347 09/18/11 1252  WBC -- 10.3 9.3  HGB -- 9.3* 11.2*  PLT -- 422* 496*  LABCREA -- -- --  CREATININE 0.73 0.95 0.90   Estimated Creatinine Clearance: 44.9 ml/min (by C-G formula based on Cr of 0.73).   Microbiology: Recent Results (from the past 720 hour(s))  MRSA PCR SCREENING     Status: Abnormal   Collection Time   09/18/11 12:47 PM      Component Value Range Status Comment   MRSA by PCR POSITIVE (*) NEGATIVE  Final   CLOSTRIDIUM DIFFICILE BY PCR     Status: Normal   Collection Time   09/19/11  9:11 AM      Component Value Range Status Comment   C difficile by pcr NEGATIVE  NEGATIVE  Final   CULTURE, RESPIRATORY     Status: Normal (Preliminary result)   Collection Time   09/19/11  9:46 AM      Component Value Range Status Comment   Specimen Description SPUTUM   Final    Special Requests NONE   Final    Gram Stain     Final    Value: MODERATE WBC PRESENT, PREDOMINANTLY PMN     FEW SQUAMOUS EPITHELIAL CELLS PRESENT     NO ORGANISMS SEEN   Culture NORMAL OROPHARYNGEAL FLORA   Final    Report Status PENDING   Incomplete     Anti-infectives    Start     Dose/Rate Route Frequency Ordered Stop   09/18/11 1400   ceFEPIme (MAXIPIME) 2 g in dextrose 5 % 50 mL IVPB        2 g 100 mL/hr over 30 Minutes Intravenous Every 24 hours 09/18/11 1315     09/18/11 1400   ciprofloxacin (CIPRO) IVPB 400 mg        400 mg 200 mL/hr over 60 Minutes Intravenous Every 12 hours 09/18/11 1317     09/18/11 1330   vancomycin (VANCOCIN) 750 mg in sodium chloride 0.9 % 150 mL IVPB        750 mg 150 mL/hr over 60 Minutes Intravenous Every 24 hours 09/18/11 1315            Assessment:  71 yo F admit with acute respiratory failure, underlying bronchiectasis   History of Pseudomonas and MRSA   Noted abx allergies: Doxycycline (vomitting), Levofloxacin (has tolerated Cipro as outpatient), PCN, sulfa  Day #4 Vancomycin/Cefepime/Cipro for RML PNA  Renal function stable, CrCl 45 ml/min   Goal of Therapy:  Vancomycin trough level 15-20 mcg/ml  Plan:   Vancomycin trough today before 4th dose  Continue Cefepime & Cipro as ordered  Follow renal function,  cultures, duration of therapy  Loralee Pacas, PharmD, BCPS Pager: [phone] 09/21/2011,9:30 AM

## 2011-09-21 NOTE — Telephone Encounter (Signed)
I spoke with daughter and she states the issue has already been resolved and needed nothing further

## 2011-09-22 ENCOUNTER — Inpatient Hospital Stay (HOSPITAL_COMMUNITY): Payer: Medicare Other

## 2011-09-22 DIAGNOSIS — M109 Gout, unspecified: Secondary | ICD-10-CM

## 2011-09-22 DIAGNOSIS — J189 Pneumonia, unspecified organism: Secondary | ICD-10-CM

## 2011-09-22 DIAGNOSIS — J471 Bronchiectasis with (acute) exacerbation: Secondary | ICD-10-CM

## 2011-09-22 LAB — CBC
HCT: 26.1 % — ABNORMAL LOW (ref 36.0–46.0)
Hemoglobin: 8.4 g/dL — ABNORMAL LOW (ref 12.0–15.0)
RDW: 14.2 % (ref 11.5–15.5)
WBC: 8.7 10*3/uL (ref 4.0–10.5)

## 2011-09-22 LAB — IRON AND TIBC
Saturation Ratios: 18 % — ABNORMAL LOW (ref 20–55)
TIBC: 196 ug/dL — ABNORMAL LOW (ref 250–470)

## 2011-09-22 LAB — BASIC METABOLIC PANEL
Chloride: 104 mEq/L (ref 96–112)
Creatinine, Ser: 0.85 mg/dL (ref 0.50–1.10)
GFR calc Af Amer: 77 mL/min — ABNORMAL LOW (ref >90)
Potassium: 2.8 mEq/L — ABNORMAL LOW (ref 3.5–5.1)

## 2011-09-22 LAB — URIC ACID: Uric Acid, Serum: 2.7 mg/dL (ref 2.4–7.0)

## 2011-09-22 LAB — VITAMIN B12: Vitamin B-12: 638 pg/mL (ref 211–911)

## 2011-09-22 LAB — MAGNESIUM: Magnesium: 1.6 mg/dL (ref 1.5–2.5)

## 2011-09-22 MED ORDER — POTASSIUM CHLORIDE CRYS ER 20 MEQ PO TBCR
40.0000 meq | EXTENDED_RELEASE_TABLET | ORAL | Status: AC
  Administered 2011-09-22 (×3): 40 meq via ORAL
  Filled 2011-09-22 (×3): qty 2

## 2011-09-22 MED ORDER — DM-GUAIFENESIN ER 30-600 MG PO TB12
1.0000 | ORAL_TABLET | Freq: Two times a day (BID) | ORAL | Status: DC
  Administered 2011-09-22 – 2011-09-28 (×13): 1 via ORAL
  Filled 2011-09-22 (×14): qty 1

## 2011-09-22 NOTE — Progress Notes (Signed)
   CARE MANAGEMENT NOTE 09/22/2011  Patient:  KIMORI, TARTAGLIA   Account Number:  [id number]  Date Initiated:  09/18/2011  Documentation initiated by:  [name],RHONDA  Subjective/Objective Assessment:   pt admitted from mdo with o2 sat of 77% and three week hx of respiratory congestion and treatments     Action/Plan:   lives at home   Anticipated DC Date:  09/24/2011   Anticipated DC Plan:  HOME W HOME HEALTH SERVICES  In-house referral  NA      DC Planning Services  NA      [facility] Choice  NA   Choice offered to / List presented to:  NA   DME arranged  NA      DME agency  NA     HH arranged  NA      HH agency  NA   Status of service:  In process, will continue to follow Medicare Important Message given?  NA - LOS <3 / Initial given by admissions (If response is "NO", the following Medicare IM given date fields will be blank) Date Medicare IM given:   Date Additional Medicare IM given:    Discharge Disposition:    Per UR Regulation:  Reviewed for med. necessity/level of care/duration of stay  If discussed at Long Length of Stay Meetings, dates discussed:    Comments:  09/22/11 [facility] RN,BSN NCM [phone] UR REVIEW COMPLETED.   [id number] Earlene Plater, RN,BSN,CCM Case Management [phone]

## 2011-09-22 NOTE — Progress Notes (Signed)
Name: Brooke [name] MRN: [id number] DOB: 04-26-1941    LOS: 4  PCCM Service   Brief patient profile:  71 yowf  with   Bronchiectasis. MRSA colonization . Last sputum cx +Psuedomonas. Presented to clinic with 3 week hx of progressively worsening cough, hemoptysis ,  congestion and dyspnea unresponsive to OP abx. On arrival to office new onset of Hypoxia with sats ~77% (rechecked) . Admitted to SDU on 4/12  Satn on arrival 96% RA.  Micro/sepsis: MRSA screen 4/12 > POSITIVE 4/12 BC x 2 > 4/13 Sputum CX >>nl flora 4/13 Stool c diff pcr > neg  Antibiotics: 4/12 Vanc(HCAP)>>4/15 4/12 Cefepime(HCAP)>> 4/12 Cipro(HCAP)>>  Tests / Events:  Subjective: Still has cough.  Lt toe pain improved.  Denies chest pain.  Still feels weak.  Does not feel like she is ready for hospital d/c.  Vital Signs: BP 98/57  Pulse 77  Temp(Src) 98.1 F (36.7 C) (Oral)  Resp 20  Ht 5\' 5"  (1.651 m)  Wt 100 lb 15.5 oz (45.8 kg)  BMI 16.80 kg/m2  SpO2 99%   Physical Examination: General - no distress, cachetic HEENT - no sinus tenderness Cardiac - s1s2 no murmur Chest - basilar crackles, no wheeze Abd - soft, notender Ext - no edema, 1st metatarsal joint swollen/red improved Neuro - normal strength Psych - normal mood, behavior  CBC    Component Value Date/Time   WBC 8.7 09/22/2011 0427   RBC 2.98* 09/22/2011 0427   HGB 8.4* 09/22/2011 0427   HCT 26.1* 09/22/2011 0427   PLT 425* 09/22/2011 0427   MCV 87.6 09/22/2011 0427   MCH 28.2 09/22/2011 0427   MCHC 32.2 09/22/2011 0427   RDW 14.2 09/22/2011 0427   LYMPHSABS 1.8 09/18/2011 1252   MONOABS 0.8 09/18/2011 1252   EOSABS 0.2 09/18/2011 1252   BASOSABS 0.0 09/18/2011 1252    BMET    Component Value Date/Time   NA 137 09/22/2011 0427   K 2.8* 09/22/2011 0427   CL 104 09/22/2011 0427   CO2 25 09/22/2011 0427   GLUCOSE 93 09/22/2011 0427   BUN 6 09/22/2011 0427   CREATININE 0.85 09/22/2011 0427   CALCIUM 8.1* 09/22/2011 0427   GFRNONAA 66* 09/22/2011  0427   GFRAA 77* 09/22/2011 0427    No results found.  Assessment and Plan:  Acute respiratory failure in setting of probable Rt sided PNA with BTX.  Has prior hx of MRSA, Pseudomonas -continue cefepime, cipro>>narrow abx if CXR improved -f/u CXR later today -titrate FiO2 to keep SpO2 > 90% -continue scheduled nebulizer tx -add mucinex DM for cough  Hypothyroid -continue synthroid  Hx of HTN -Hold diuretic for now with relatively low BP  Diarrhea>>improved -C diff PCR negative -will advance diet as tolerated and monitor  Protein calorie malnutrition -added ensure supplementas  1st metatarsal swelling on Lt foot>>Likely gout -Add NSAIDS and monitor  Anemia -f/u CBC  -check anemia panel -transfuse for Hb < 7  Hypokalemia -replace electrolytes and f/u BMET   Coralyn Helling, MD 09/22/2011, 7:38 AM Pager:  [phone]

## 2011-09-23 LAB — BASIC METABOLIC PANEL
BUN: 6 mg/dL (ref 6–23)
Calcium: 8.3 mg/dL — ABNORMAL LOW (ref 8.4–10.5)
Creatinine, Ser: 0.8 mg/dL (ref 0.50–1.10)
GFR calc non Af Amer: 71 mL/min — ABNORMAL LOW (ref >90)
Glucose, Bld: 88 mg/dL (ref 70–99)
Sodium: 139 mEq/L (ref 135–145)

## 2011-09-23 LAB — CBC
HCT: 26.5 % — ABNORMAL LOW (ref 36.0–46.0)
Hemoglobin: 8.5 g/dL — ABNORMAL LOW (ref 12.0–15.0)
MCH: 28 pg (ref 26.0–34.0)
MCHC: 32.1 g/dL (ref 30.0–36.0)
MCV: 87.2 fL (ref 78.0–100.0)

## 2011-09-23 MED ORDER — LOPERAMIDE HCL 2 MG PO CAPS
2.0000 mg | ORAL_CAPSULE | ORAL | Status: DC | PRN
  Administered 2011-09-24 – 2011-09-27 (×5): 2 mg via ORAL
  Filled 2011-09-23 (×6): qty 1

## 2011-09-23 MED ORDER — FERROUS SULFATE 325 (65 FE) MG PO TABS
325.0000 mg | ORAL_TABLET | Freq: Every day | ORAL | Status: DC
  Administered 2011-09-24 – 2011-09-28 (×5): 325 mg via ORAL
  Filled 2011-09-23 (×5): qty 1

## 2011-09-23 NOTE — Progress Notes (Signed)
Name: Brooke [name] MRN: [id number] DOB: February 12, 1941    LOS: 5  PCCM Service   Brief patient profile:  71 yowf  with   Bronchiectasis. MRSA colonization . Last sputum cx +Psuedomonas. Presented to clinic with 3 week hx of progressively worsening cough, hemoptysis ,  congestion and dyspnea unresponsive to OP abx. On arrival to office new onset of Hypoxia with sats ~77% (rechecked) . Admitted to SDU on 4/12  Satn on arrival 96% RA.  Micro/sepsis: MRSA screen 4/12 > POSITIVE 4/12 BC x 2 > 4/13 Sputum CX >>nl flora 4/13 Stool c diff pcr > neg  Antibiotics: 4/12 Vanc(HCAP)>>4/15 4/12 Cefepime(HCAP)>> 4/12 Cipro(HCAP)>>  Tests / Events:  Subjective: Intermittent diarrhea.  No abdominal pain.  Breathing improved, but still has cough with green sputum.  Toe pain better.  Vital Signs: BP 92/55  Pulse 78  Temp(Src) 98.7 F (37.1 C) (Oral)  Resp 18  Ht 5\' 5"  (1.651 m)  Wt 100 lb 15.5 oz (45.8 kg)  BMI 16.80 kg/m2  SpO2 98%   Physical Examination: General - no distress, cachetic HEENT - no sinus tenderness Cardiac - s1s2 no murmur Chest - basilar crackles, no wheeze Abd - soft, notender Ext - no edema, no redness1st metatarsal joint Neuro - normal strength Psych - normal mood, behavior  CBC    Component Value Date/Time   WBC 9.1 09/23/2011 0430   RBC 3.04* 09/23/2011 0430   HGB 8.5* 09/23/2011 0430   HCT 26.5* 09/23/2011 0430   PLT 425* 09/23/2011 0430   MCV 87.2 09/23/2011 0430   MCH 28.0 09/23/2011 0430   MCHC 32.1 09/23/2011 0430   RDW 14.4 09/23/2011 0430   LYMPHSABS 1.8 09/18/2011 1252   MONOABS 0.8 09/18/2011 1252   EOSABS 0.2 09/18/2011 1252   BASOSABS 0.0 09/18/2011 1252    BMET    Component Value Date/Time   NA 139 09/23/2011 0430   K 4.6 09/23/2011 0430   CL 108 09/23/2011 0430   CO2 25 09/23/2011 0430   GLUCOSE 88 09/23/2011 0430   BUN 6 09/23/2011 0430   CREATININE 0.80 09/23/2011 0430   CALCIUM 8.3* 09/23/2011 0430   GFRNONAA 71* 09/23/2011 0430   GFRAA 83*  09/23/2011 0430    Dg Chest 2 View  09/22/2011  *RADIOLOGY REPORT*  Clinical Data: Follow up pneumonia  CHEST - 2 VIEW  Comparison: 09/19/2011  Findings: Heart size and mediastinal contours are normal.  There are small bilateral pleural effusions, slightly increased from previous exam.  Interval improvement and right upper lobe opacity.  There is mild interstitial edema, superimposed upon changes of COPD.  Calcified granulomas are identified in both lungs.  IMPRESSION:  1.  Suspect mild CHF which is superimposed upon changes of COPD. 2.  Partial improvement in right upper lobe airspace opacity.  Original Report Authenticated By: Rosealee Albee, M.D.    Assessment and Plan:  Acute respiratory failure in setting of probable Rt sided PNA with BTX.  Has prior hx of MRSA, Pseudomonas -continue cefepime, cipro D6/x -titrate FiO2 to keep SpO2 > 90% -continue scheduled nebulizer tx -added mucinex DM for cough  Hypothyroid -continue synthroid  Hx of HTN -Hold diuretic for now with relatively low BP  Diarrhea with hx of IBS -C diff PCR negative -will advance diet as tolerated and monitor -prn imodium -has been seen by [facility] as outpt>>she will let medical team know if she needs further inpt GI eval  Protein calorie malnutrition -added ensure supplements  1st metatarsal  swelling on Lt foot>>Likely gout>>improving -Added NSAIDS and monitor  Anemia -f/u CBC intermittently -add FeSO4 -transfuse for Hb < 7  Hypokalemia -f/u BMET intermittently  Disposition -she feels she needs another day or two in hospital with IV ABX   Coralyn Helling, MD 09/23/2011, 1:34 PM Pager:  [phone]

## 2011-09-24 DIAGNOSIS — D649 Anemia, unspecified: Secondary | ICD-10-CM | POA: Diagnosis present

## 2011-09-24 DIAGNOSIS — M109 Gout, unspecified: Secondary | ICD-10-CM | POA: Diagnosis not present

## 2011-09-24 DIAGNOSIS — R197 Diarrhea, unspecified: Secondary | ICD-10-CM | POA: Diagnosis present

## 2011-09-24 DIAGNOSIS — J9600 Acute respiratory failure, unspecified whether with hypoxia or hypercapnia: Secondary | ICD-10-CM | POA: Diagnosis present

## 2011-09-24 DIAGNOSIS — E43 Unspecified severe protein-calorie malnutrition: Secondary | ICD-10-CM | POA: Diagnosis present

## 2011-09-24 MED ORDER — BENZONATATE 100 MG PO CAPS
200.0000 mg | ORAL_CAPSULE | Freq: Two times a day (BID) | ORAL | Status: DC | PRN
  Administered 2011-09-24 – 2011-09-25 (×4): 200 mg via ORAL
  Filled 2011-09-24 (×2): qty 2

## 2011-09-24 NOTE — Progress Notes (Signed)
Name: Brooke [name] MRN: [id number] DOB: 24-Feb-1941    LOS: 6  PCCM Service   Brief patient profile:  71 yowf  with   Bronchiectasis. MRSA colonization . Last sputum cx +Psuedomonas. Presented to clinic with 3 week hx of progressively worsening cough, hemoptysis ,  congestion and dyspnea unresponsive to OP abx. On arrival to office new onset of Hypoxia with sats ~77% (rechecked) . Admitted to SDU on 4/12  Satn on arrival 96% RA.  Micro/sepsis: MRSA screen 4/12 > POSITIVE 4/12 BC x 2 > 4/13 Sputum CX >>nl flora 4/13 Stool c diff pcr > neg  Antibiotics: 4/12 Vanc(HCAP)>>4/15 4/12 Cefepime(HCAP)>> 4/12 Cipro(HCAP)>>  Tests / Events:  Subjective: Intermittent diarrhea still.  Cough decreased.  Vital Signs: BP 93/55  Pulse 73  Temp(Src) 98 F (36.7 C) (Oral)  Resp 16  Ht 5\' 5"  (1.651 m)  Wt 100 lb 15.5 oz (45.8 kg)  BMI 16.80 kg/m2  SpO2 95%   Physical Examination: General - no distress, cachetic HEENT - no sinus tenderness Cardiac - s1s2 no murmur Chest - no wheeze/rales Abd - soft, non-tender Ext - no edema Neuro - normal strength Psych - normal mood, behavior  CBC    Component Value Date/Time   WBC 9.1 09/23/2011 0430   RBC 3.04* 09/23/2011 0430   HGB 8.5* 09/23/2011 0430   HCT 26.5* 09/23/2011 0430   PLT 425* 09/23/2011 0430   MCV 87.2 09/23/2011 0430   MCH 28.0 09/23/2011 0430   MCHC 32.1 09/23/2011 0430   RDW 14.4 09/23/2011 0430   LYMPHSABS 1.8 09/18/2011 1252   MONOABS 0.8 09/18/2011 1252   EOSABS 0.2 09/18/2011 1252   BASOSABS 0.0 09/18/2011 1252    BMET    Component Value Date/Time   NA 139 09/23/2011 0430   K 4.6 09/23/2011 0430   CL 108 09/23/2011 0430   CO2 25 09/23/2011 0430   GLUCOSE 88 09/23/2011 0430   BUN 6 09/23/2011 0430   CREATININE 0.80 09/23/2011 0430   CALCIUM 8.3* 09/23/2011 0430   GFRNONAA 71* 09/23/2011 0430   GFRAA 83* 09/23/2011 0430    No results found.  Assessment and Plan:  Acute respiratory failure in setting of probable Rt sided  PNA with BTX.  Has prior hx of MRSA, Pseudomonas -continue cefepime, cipro D7/14 abx -titrate FiO2 to keep SpO2 > 90% -continue scheduled nebulizer tx -added mucinex DM for cough -added tessalon perles -pt would like to repeat MRSA nasal swab to see if she has cleared>>will need to do this at time of discharge  Hypothyroid -continue synthroid  Hx of HTN -Hold diuretic for now with relatively low BP  Diarrhea with hx of IBS -C diff PCR negative -tolerating diet -prn imodium -has been seen by [facility] as outpt>>she will let medical team know if she needs further inpt GI eval  Protein calorie malnutrition -added ensure supplements  1st metatarsal swelling on Lt foot>>Likely gout>>improved -NSAIDS prn  Anemia -f/u CBC intermittently -added FeSO4 -transfuse for Hb < 7  Hypokalemia -f/u BMET intermittently  Disposition -she feels she needs another day or two in hospital with IV ABX>>tentative d/c date of 04/20 when pt's family will be available to assist with d/c home   [facility] Minor ACNP Adolph Pollack PCCM Pager [phone] till 3 pm If no answer page [phone] 09/24/2011, 10:14 AM   Reviewed above, examined pt, and agree with assessment/plan.  Coralyn Helling, MD 09/24/2011, 2:06 PM Pager:  [phone]

## 2011-09-24 NOTE — Progress Notes (Signed)
ANTIBIOTIC CONSULT NOTE - FOLLOW UP  Pharmacy Consult for Cefepime/Cipro (vanc 4/12 >>4/15) Indication: Pneumonia, acute respiratory failure  Allergies  Allergen Reactions  . Doxycycline     vomiting  . Levofloxacin Other (See Comments)    Unknown rxn.  Tolerates Cipro  . Mometasone Furoate     REACTION: swollen throat, thrush  . Penicillins   . Sulfonamide Derivatives     Patient Measurements: Height: 5\' 5"  (165.1 cm) Weight: 100 lb 15.5 oz (45.8 kg) IBW/kg (Calculated) : 57    Vital Signs: Temp: 98 [name] (36.7 C) (04/18 0645) Temp src: Oral (04/18 0645) BP: 93/55 mmHg (04/18 0645) Pulse Rate: 73  (04/18 0645) Intake/Output from previous day: 04/17 0701 - 04/18 0700 In: 3250.7 [P.O.:400; I.V.:1450.7; IV Piggyback:1400] Out: 2700 [Urine:2700] Intake/Output from this shift:    Labs:  Basename 09/23/11 0430 04/Brooke/13 0427  WBC 9.1 8.7  HGB 8.5* 8.4*  PLT 425* 425*  LABCREA -- --  CREATININE 0.80 0.85   Estimated Creatinine Clearance: 45.3 ml/min (by C-G formula based on Cr of 0.8).   Microbiology: Recent Results (from the past 720 hour(s))  MRSA PCR SCREENING     Status: Abnormal   Collection Time   09/18/11 12:47 PM      Component Value Range Status Comment   MRSA by PCR POSITIVE (*) NEGATIVE  Final   CULTURE, BLOOD (ROUTINE X 2)     Status: Normal (Preliminary result)   Collection Time   09/18/11  1:00 PM      Component Value Range Status Comment   Specimen Description BLOOD RIGHT ARM   Final    Special Requests BOTTLES DRAWN AEROBIC AND ANAEROBIC 5CC   Final    Culture  Setup Time [id number]   Final    Culture     Final    Value:        BLOOD CULTURE RECEIVED NO GROWTH TO DATE CULTURE WILL BE HELD FOR 5 DAYS BEFORE ISSUING A FINAL NEGATIVE REPORT   Report Status PENDING   Incomplete   CULTURE, BLOOD (ROUTINE X 2)     Status: Normal (Preliminary result)   Collection Time   09/18/11  1:00 PM      Component Value Range Status Comment   Specimen  Description BLOOD RIGHT HAND   Final    Special Requests BOTTLES DRAWN AEROBIC ONLY [facility]   Final    Culture  Setup Time [id number]   Final    Culture     Final    Value:        BLOOD CULTURE RECEIVED NO GROWTH TO DATE CULTURE WILL BE HELD FOR 5 DAYS BEFORE ISSUING A FINAL NEGATIVE REPORT   Report Status PENDING   Incomplete   CLOSTRIDIUM DIFFICILE BY PCR     Status: Normal   Collection Time   09/19/11  9:11 AM      Component Value Range Status Comment   C difficile by pcr NEGATIVE  NEGATIVE  Final   CULTURE, RESPIRATORY     Status: Normal   Collection Time   09/19/11  9:46 AM      Component Value Range Status Comment   Specimen Description SPUTUM   Final    Special Requests NONE   Final    Gram Stain     Final    Value: MODERATE WBC PRESENT, PREDOMINANTLY PMN     FEW SQUAMOUS EPITHELIAL CELLS PRESENT     NO ORGANISMS SEEN   Culture NORMAL OROPHARYNGEAL FLORA  Final    Report Status 09/21/2011 FINAL   Final     Anti-infectives     Start     Dose/Rate Route Frequency Ordered Stop   09/18/11 1400   ceFEPIme (MAXIPIME) 2 g in dextrose 5 % 50 mL IVPB        2 g 100 mL/hr over 30 Minutes Intravenous Every 24 hours 09/18/11 1315     09/18/11 1400   ciprofloxacin (CIPRO) IVPB 400 mg        400 mg 200 mL/hr over 60 Minutes Intravenous Every 12 hours 09/18/11 1317     09/18/11 1330   vancomycin (VANCOCIN) 750 mg in sodium chloride 0.9 % 150 mL IVPB  Status:  Discontinued        750 mg 150 mL/hr over 60 Minutes Intravenous Every 24 hours 09/18/11 1315 09/21/11 1338          Assessment:  71 yo [name] admitted 4/12 with acute respiratory failure, underlying bronchiectasis   History of Pseudomonas and MRSA   Noted abx allergies: Doxycycline (vomitting), Levofloxacin (has tolerated Cipro as outpatient), PCN, sulfa  Day #7 Cefepime/Cipro for RML PNA  Renal function stable, CrCl 45 ml/min   Goal of Therapy:  Vancomycin trough level 15-20 mcg/ml  Plan:   Continue Cefepime &  Cipro as ordered  Follow renal function, cultures, duration of therapy  Gwen Her PharmD  [phone] 09/24/2011 8:59 AM

## 2011-09-25 LAB — CULTURE, BLOOD (ROUTINE X 2)
Culture  Setup Time: 201304122359
Culture: NO GROWTH

## 2011-09-25 MED ORDER — CIPROFLOXACIN HCL 500 MG PO TABS
500.0000 mg | ORAL_TABLET | Freq: Two times a day (BID) | ORAL | Status: DC
  Administered 2011-09-25 – 2011-09-28 (×7): 500 mg via ORAL
  Filled 2011-09-25 (×9): qty 1

## 2011-09-25 MED ORDER — LEVOTHYROXINE SODIUM 50 MCG PO TABS: 50.0000 ug | ORAL_TABLET | Freq: Every day | ORAL | Status: DC

## 2011-09-25 MED ORDER — BENZONATATE 200 MG PO CAPS: 200.0000 mg | ORAL_CAPSULE | Freq: Two times a day (BID) | ORAL | Status: DC | PRN

## 2011-09-25 MED ORDER — CIPROFLOXACIN HCL 750 MG PO TABS: 750.0000 mg | ORAL_TABLET | Freq: Two times a day (BID) | ORAL | Status: DC

## 2011-09-25 MED ORDER — DM-GUAIFENESIN ER 30-600 MG PO TB12: 1.0000 | ORAL_TABLET | Freq: Two times a day (BID) | ORAL | Status: AC

## 2011-09-25 NOTE — Discharge Summary (Signed)
Physician Discharge Summary  Patient ID: Brooke [name] MRN: [id number] DOB/AGE: 1940-12-25 71 y.o.  Admit date: 09/18/2011 Discharge date: 09/26/2011 Problem List Active Problems:  BRONCHIECTASIS  Pneumonia  Respiratory failure, acute  Gout  Protein calorie malnutrition  Diarrhea  Anemia  HPI: 89 yowf with Bronchiectasis. MRSA colonization . Last sputum cx +Psuedomonas. Presented to clinic with 3 week hx of progressively worsening cough, hemoptysis , congestion and dyspnea unresponsive to OP abx. On arrival to office new onset of Hypoxia with sats ~77% (rechecked) . Admitted to SDU on 4/12 Satn on arrival 96% RA  Hospital Course:  Micro/sepsis:  MRSA screen 4/12 > POSITIVE  4/12 BC x 2 >  4/13 Sputum CX >>nl flora  4/13 Stool c diff pcr > neg  Antibiotics:  4/12 Vanc(HCAP)>>4/15  4/12 Cefepime(HCAP)>>  4/12 Cipro(HCAP)>>  Tests / Events:  Subjective:  Intermittent diarrhea still. Cough decreased.  Vital Signs:  BP 93/55  Pulse 73  Temp(Src) 98 F (36.7 C) (Oral)  Resp 16  Ht 5\' 5"  (1.651 m)  Wt 100 lb 15.5 oz (45.8 kg)  BMI 16.80 kg/m2  SpO2 95%  Physical Examination:  General - no distress, cachetic  HEENT - no sinus tenderness  Cardiac - s1s2 no murmur  Chest - no wheeze/rales  Abd - soft, non-tender  Ext - no edema  Neuro - normal strength  Psych - normal mood, behavior  CBC    Component  Value  Date/Time    WBC  9.1  09/23/2011 0430    RBC  3.04*  09/23/2011 0430    HGB  8.5*  09/23/2011 0430    HCT  26.5*  09/23/2011 0430    PLT  425*  09/23/2011 0430    MCV  87.2  09/23/2011 0430    MCH  28.0  09/23/2011 0430    MCHC  32.1  09/23/2011 0430    RDW  14.4  09/23/2011 0430    LYMPHSABS  1.8  09/18/2011 1252    MONOABS  0.8  09/18/2011 1252    EOSABS  0.2  09/18/2011 1252    BASOSABS  0.0  09/18/2011 1252    BMET    Component  Value  Date/Time    NA  139  09/23/2011 0430    K  4.6  09/23/2011 0430    CL  108  09/23/2011 0430    CO2  25  09/23/2011 0430    GLUCOSE  88  09/23/2011 0430    BUN  6  09/23/2011 0430    CREATININE  0.80  09/23/2011 0430    CALCIUM  8.3*  09/23/2011 0430    GFRNONAA  71*  09/23/2011 0430    GFRAA  83*  09/23/2011 0430    No results found.  Assessment and Plan:  Acute respiratory failure in setting of probable Rt sided PNA with BTX. Has prior hx of MRSA, Pseudomonas  -continue cefepime, cipro D7/14 abx  -titrate FiO2 to keep SpO2 > 90%  -continue scheduled nebulizer tx  -added mucinex DM for cough  -added tessalon perles  -pt would like to repeat MRSA nasal swab to see if she has cleared>>will need to do this at time of discharge  -continue oral cipro for 7 days post dc. Hypothyroid  -continue synthroid  Hx of HTN  -Hold diuretic for now with relatively low BP  Diarrhea with hx of IBS  -C diff PCR negative  -tolerating diet  -prn imodium  -has been seen by [facility] as outpt>>she will let medical team know if she needs further inpt GI eval  Protein calorie malnutrition  -added ensure supplements  1st metatarsal swelling on Lt foot>>Likely gout>>improved  -NSAIDS prn  Anemia  -f/u CBC intermittently  -added FeSO4  -transfuse for Hb < 7  Hypokalemia  -f/u BMET intermittently  Disposition Dc home on 09/26/11   Labs at discharge Lab Results  Component Value Date   CREATININE 0.80 09/23/2011   BUN 6 09/23/2011   NA 139 09/23/2011   K 4.6 09/23/2011   CL 108 09/23/2011   CO2 25 09/23/2011   Lab Results  Component Value Date   WBC 9.1 09/23/2011   HGB 8.5* 09/23/2011   HCT 26.5* 09/23/2011   MCV 87.2 09/23/2011   PLT 425* 09/23/2011   Lab Results  Component Value Date   ALT 7 09/18/2011   AST 20 09/18/2011   ALKPHOS 127* 09/18/2011   BILITOT 0.1* 09/18/2011   No results found for this basename: INR, PROTIME    Current radiology studies No results found.  Disposition:  06-Home-Health Care Svc  Discharge Orders    Future Appointments: Provider: Department: Dept Phone: Center:   10/14/2011 2:45 PM  Storm Frisk, MD [facility] [phone] None     Future Orders Please Complete By Expires   Discharge patient      Comments:   Discharge on 09/26/11     Medication List  As of 09/25/2011 11:08 AM   STOP taking these medications         BC HEADACHE POWDER PO      levothyroxine 100 MCG tablet      triamterene-hydrochlorothiazide 75-50 MG per tablet         TAKE these medications         albuterol 108 (90 BASE) MCG/ACT inhaler   Commonly known as: PROVENTIL HFA;VENTOLIN HFA   Inhale 2 puffs into the lungs every 6 (six) hours as needed for wheezing.      ALPRAZolam 0.5 MG tablet   Commonly known as: XANAX   Take 1 tablet (0.5 mg total) by mouth 3 (three) times daily as needed for anxiety.      benzonatate 200 MG capsule   Commonly known as: TESSALON   Take 1 capsule (200 mg total) by mouth 2 (two) times daily as needed for cough.      citalopram 20 MG tablet   Commonly known as: CELEXA   Take 20 mg by mouth daily.      dextromethorphan-guaiFENesin 30-600 MG per 12 hr tablet   Commonly known as: MUCINEX DM   Take 1 tablet by mouth 2 (two) times daily.      estrogens conjugated (synthetic A) 0.625 MG tablet   Commonly known as: CENESTIN   Take 0.625 mg by mouth daily.      HYDROcodone-acetaminophen 10-325 MG per tablet   Commonly known as: NORCO   1-2 tablets by mouth every 4 hours as needed      HYDROcodone-homatropine 5-1.5 MG/5ML syrup   Commonly known as: HYCODAN   Take 1 tsp every 4-6 hrs as needed for cough      levothyroxine 50 MCG tablet   Commonly known as: SYNTHROID, LEVOTHROID   Take 1 tablet (50 mcg total) by mouth daily before breakfast.      loperamide 2 MG capsule   Commonly known as: IMODIUM   Take 2 mg by mouth as needed. For diarrhea relief after each loose stool.  mulitivitamin with minerals Tabs   Take 1 tablet by mouth daily.      polysaccharide iron 150 MG Caps capsule   Commonly known as: NIFEREX   Take 150 mg by mouth  daily.             Discharged Condition: fair Vital signs at Discharge. Temp:  [97.5 F (36.4 C)-98.1 F (36.7 C)] 98.1 F (36.7 C) (04/19 0540) Pulse Rate:  [66-74] 74  (04/19 0540) Resp:  [16-18] 18  (04/19 0540) BP: (100-108)/(58-64) 100/61 mmHg (04/19 0540) SpO2:  [96 %-99 %] 96 % (04/19 0540) Weight:  [99 lb 12.8 oz (45.269 kg)-102 lb 11.8 oz (46.6 kg)] 99 lb 12.8 oz (45.269 kg) (04/19 0900) Office follow up Special Information or instructions. Follow up with Tammy Parrett ANP-C, subsequent follow up with Dr Delford Field. Signed: Brett Canales Bazil Dhanani ACNP Adolph Pollack PCCM Pager [phone] till 3 pm If no answer page [phone] 09/25/2011, 11:08 AM

## 2011-09-25 NOTE — Progress Notes (Signed)
Name: Brooke [name] MRN: [id number] DOB: 1941/03/20    LOS: 7  PCCM Service   Brief patient profile:  71 yowf  with   Bronchiectasis. MRSA colonization . Last sputum cx +Psuedomonas. Presented to clinic with 3 week hx of progressively worsening cough, hemoptysis ,  congestion and dyspnea unresponsive to OP abx. On arrival to office new onset of Hypoxia with sats ~77% (rechecked) . Admitted to SDU on 4/12  Satn on arrival 96% RA.  Micro/sepsis: MRSA screen 4/12 > POSITIVE 4/12 BC x 2 >negative 4/13 Sputum CX >>nl flora 4/13 Stool c diff pcr > neg  Antibiotics: 4/12 Vanc(HCAP)>>4/15 4/12 Cefepime(HCAP)>>4/19 4/12 Cipro(HCAP)>>  Tests / Events:  Subjective: Concerned about home situation with her daughter's recent separation from husband.   Breathing better.  Gaining weight.  Appetite not back to normal, but improving.  Vital Signs: BP 100/61  Pulse 74  Temp(Src) 98.1 F (36.7 C) (Oral)  Resp 18  Ht 5\' 5"  (1.651 m)  Wt 102 lb 11.8 oz (46.6 kg)  BMI 17.10 kg/m2  SpO2 96%   Physical Examination: General - no distress, cachetic HEENT - no sinus tenderness Cardiac - s1s2 no murmur Chest - no wheeze/rales Abd - soft, non-tender Ext - no edema Neuro - normal strength Psych - normal mood, behavior  CBC    Component Value Date/Time   WBC 9.1 09/23/2011 0430   RBC 3.04* 09/23/2011 0430   HGB 8.5* 09/23/2011 0430   HCT 26.5* 09/23/2011 0430   PLT 425* 09/23/2011 0430   MCV 87.2 09/23/2011 0430   MCH 28.0 09/23/2011 0430   MCHC 32.1 09/23/2011 0430   RDW 14.4 09/23/2011 0430   LYMPHSABS 1.8 09/18/2011 1252   MONOABS 0.8 09/18/2011 1252   EOSABS 0.2 09/18/2011 1252   BASOSABS 0.0 09/18/2011 1252    BMET    Component Value Date/Time   NA 139 09/23/2011 0430   K 4.6 09/23/2011 0430   CL 108 09/23/2011 0430   CO2 25 09/23/2011 0430   GLUCOSE 88 09/23/2011 0430   BUN 6 09/23/2011 0430   CREATININE 0.80 09/23/2011 0430   CALCIUM 8.3* 09/23/2011 0430   GFRNONAA 71* 09/23/2011 0430   GFRAA 83* 09/23/2011 0430    No results found.  Assessment and Plan:  Acute respiratory failure in setting of probable Rt sided PNA with BTX.  Has prior hx of MRSA, Pseudomonas -D8/14 abx>>will d/c cefepime and continue oral cipro to complete 14 days of Abx  -titrate FiO2 to keep SpO2 > 90% -continue scheduled nebulizer tx -added mucinex DM for cough -added tessalon perles -pt would like to repeat MRSA nasal swab to see if she has cleared>>will repeat nasal swab today -will need f/u CXR as outpt  Hypothyroid -continue synthroid  Hx of HTN -Hold diuretic for now with relatively low BP  Diarrhea with hx of IBS -C diff PCR negative -tolerating diet -prn imodium -has been seen by [facility] as outpt>>she will let medical team know if she needs further inpt GI eval  Protein calorie malnutrition -added ensure supplements  1st metatarsal swelling on Lt foot>>Likely gout>>improved -NSAIDS prn  Anemia -f/u CBC as outpt -added FeSO4 -transfuse for Hb < 7  Hypokalemia -f/u BMET as outpt  Disposition -she feels she needs another day or two in hospital with IV ABX>>tentative d/c date of 04/20 when pt's family will be available to assist with d/c home  Hopefully will be ready for D/C home 4/20 if she tolerates transition to oral antibiotics.  She will need f/u CBC, BMET, and CXR as outpt.  She will need f/u in one week with either [name] or Tammy Parrett.   Coralyn Helling, MD 09/25/2011, 9:22 AM Pager:  [phone]

## 2011-09-26 MED ORDER — ALBUTEROL SULFATE (5 MG/ML) 0.5% IN NEBU
2.5000 mg | INHALATION_SOLUTION | Freq: Two times a day (BID) | RESPIRATORY_TRACT | Status: DC
  Administered 2011-09-27 (×2): 2.5 mg via RESPIRATORY_TRACT
  Filled 2011-09-26: qty 0.5

## 2011-09-26 MED ORDER — IPRATROPIUM BROMIDE 0.02 % IN SOLN
0.5000 mg | Freq: Two times a day (BID) | RESPIRATORY_TRACT | Status: DC
  Administered 2011-09-27 (×2): 0.5 mg via RESPIRATORY_TRACT
  Filled 2011-09-26 (×2): qty 2.5

## 2011-09-26 NOTE — Progress Notes (Signed)
Name: Brooke [name] MRN: [id number] DOB: 1941-03-03    LOS: 8  PCCM Service   Brief patient profile:  71 yowf  with   Bronchiectasis. MRSA colonization . Last sputum cx +Psuedomonas. Presented to clinic with 3 week hx of progressively worsening cough, hemoptysis ,  congestion and dyspnea unresponsive to OP abx. On arrival to office new onset of Hypoxia with sats ~77% (rechecked) . Admitted to SDU on 4/12  Satn on arrival 96% RA.  Micro/sepsis: MRSA screen 4/12 > POSITIVE 4/12 BC x 2 >negative 4/13 Sputum CX >>nl flora 4/13 Stool c diff pcr > neg  Antibiotics: 4/12 Vanc(HCAP)>>4/15 4/12 Cefepime(HCAP)>>4/19 4/12 Cipro(HCAP)>>  Tests / Events:  Subjective: Pt was supposed to go home today, but now says "i'm not ready yet"  No increased wob, no acute events overnight.   Vital Signs: BP 100/64  Pulse 81  Temp(Src) 98 F (36.7 C) (Oral)  Resp 16  Ht 5\' 5"  (1.651 m)  Wt 47.5 kg (104 lb 11.5 oz)  BMI 17.43 kg/m2  SpO2 100%   Physical Examination: General - no distress, cachetic Chest with a few scattered crackles, no wheezing Cor with rrr LE without edema, no cyanosis Alert, oriented, moves all 4.   CBC    Component Value Date/Time   WBC 9.1 09/23/2011 0430   RBC 3.04* 09/23/2011 0430   HGB 8.5* 09/23/2011 0430   HCT 26.5* 09/23/2011 0430   PLT 425* 09/23/2011 0430   MCV 87.2 09/23/2011 0430   MCH 28.0 09/23/2011 0430   MCHC 32.1 09/23/2011 0430   RDW 14.4 09/23/2011 0430   LYMPHSABS 1.8 09/18/2011 1252   MONOABS 0.8 09/18/2011 1252   EOSABS 0.2 09/18/2011 1252   BASOSABS 0.0 09/18/2011 1252    BMET    Component Value Date/Time   NA 139 09/23/2011 0430   K 4.6 09/23/2011 0430   CL 108 09/23/2011 0430   CO2 25 09/23/2011 0430   GLUCOSE 88 09/23/2011 0430   BUN 6 09/23/2011 0430   CREATININE 0.80 09/23/2011 0430   CALCIUM 8.3* 09/23/2011 0430   GFRNONAA 71* 09/23/2011 0430   GFRAA 83* 09/23/2011 0430    No results found.  Assessment and Plan:  Acute respiratory failure  in setting of probable Rt sided PNA with BTX.  Has prior hx of MRSA, Pseudomonas -D8/14 abx>>will d/c cefepime and continue oral cipro  Pt is stable currently, no increased wob, and no bronchospasm on exam.  I think she is medically stable for discharge home, but she wishes to stay because she doesn;t "feel well".

## 2011-09-27 ENCOUNTER — Inpatient Hospital Stay (HOSPITAL_COMMUNITY): Payer: Medicare Other

## 2011-09-27 NOTE — Progress Notes (Signed)
Name: Brooke [name] MRN: [id number] DOB: Apr 27, 1941    LOS: 9  PCCM Service   Brief patient profile:  71 yowf  with   Bronchiectasis. MRSA colonization . Last sputum cx +Psuedomonas. Presented to clinic with 3 week hx of progressively worsening cough, hemoptysis ,  congestion and dyspnea unresponsive to OP abx. On arrival to office new onset of Hypoxia with sats ~77% (rechecked) . Admitted to SDU on 4/12  Satn on arrival 96% RA.  Micro/sepsis: MRSA screen 4/12 > POSITIVE 4/12 BC x 2 >negative 4/13 Sputum CX >>nl flora 4/13 Stool c diff pcr > neg  Antibiotics: 4/12 Vanc(HCAP)>>4/15 4/12 Cefepime(HCAP)>>4/19 4/12 Cipro(HCAP)>>  Tests / Events:  Subjective: Pt was supposed to go home today, but now says "i'm not ready yet"  No increased wob, no acute events overnight.   Vital Signs: BP 101/67  Pulse 75  Temp(Src) 97.9 F (36.6 C) (Oral)  Resp 20  Ht 5\' 5"  (1.651 m)  Wt 47.945 kg (105 lb 11.2 oz)  BMI 17.59 kg/m2  SpO2 92%   Physical Examination: General - no distress, cachetic Chest with a few scattered crackles, no wheezing Cor with rrr LE without edema, no cyanosis Alert, oriented, moves all 4.   CBC    Component Value Date/Time   WBC 9.1 09/23/2011 0430   RBC 3.04* 09/23/2011 0430   HGB 8.5* 09/23/2011 0430   HCT 26.5* 09/23/2011 0430   PLT 425* 09/23/2011 0430   MCV 87.2 09/23/2011 0430   MCH 28.0 09/23/2011 0430   MCHC 32.1 09/23/2011 0430   RDW 14.4 09/23/2011 0430   LYMPHSABS 1.8 09/18/2011 1252   MONOABS 0.8 09/18/2011 1252   EOSABS 0.2 09/18/2011 1252   BASOSABS 0.0 09/18/2011 1252    BMET    Component Value Date/Time   NA 139 09/23/2011 0430   K 4.6 09/23/2011 0430   CL 108 09/23/2011 0430   CO2 25 09/23/2011 0430   GLUCOSE 88 09/23/2011 0430   BUN 6 09/23/2011 0430   CREATININE 0.80 09/23/2011 0430   CALCIUM 8.3* 09/23/2011 0430   GFRNONAA 71* 09/23/2011 0430   GFRAA 83* 09/23/2011 0430    No results found.  Assessment and Plan:  Acute respiratory  failure in setting of probable Rt sided PNA with BTX.  Has prior hx of MRSA, Pseudomonas -D8/14 abx>>will d/c cefepime and continue oral cipro  Pt is stable currently, no increased wob, and no bronchospasm on exam.   Chest Pain Is sharp, worse with inspiration, but worsened with palpation and movement.  Sats are ok, and no increased wob.  Suspect is due to chest wall pain from coughing.  Will check cxr today for completeness to r/o acute process.   Disposition: The pt is clearly not wanting to go home.  Will get social worker to see her in am to discuss nursing home placement.

## 2011-09-27 NOTE — Progress Notes (Signed)
Pt complain of pain on her left side when she coughs, breaths, or moves. Annitta Needs, RN

## 2011-09-28 MED ORDER — PROMETHAZINE HCL 12.5 MG PO TABS: 12.5000 mg | ORAL_TABLET | Freq: Four times a day (QID) | ORAL | Status: DC | PRN

## 2011-09-28 MED ORDER — LEVOTHYROXINE SODIUM 100 MCG PO TABS: 50.0000 ug | ORAL_TABLET | Freq: Every day | ORAL | Status: DC

## 2011-09-28 MED ORDER — LOPERAMIDE HCL 2 MG PO CAPS: 2.0000 mg | ORAL_CAPSULE | ORAL | Status: DC | PRN

## 2011-09-28 MED ORDER — CIPROFLOXACIN HCL 750 MG PO TABS: 750.0000 mg | ORAL_TABLET | Freq: Two times a day (BID) | ORAL | Status: DC

## 2011-09-28 MED ORDER — HYDROCODONE-ACETAMINOPHEN 10-325 MG PO TABS: ORAL_TABLET | ORAL | Status: DC

## 2011-09-28 MED ORDER — BENZONATATE 200 MG PO CAPS: 200.0000 mg | ORAL_CAPSULE | Freq: Two times a day (BID) | ORAL | Status: AC | PRN

## 2011-09-28 NOTE — Discharge Summary (Signed)
Physician Discharge Summary     Patient ID: Brooke [name] MRN: [id number] DOB/AGE: 73-Aug-1942 73 y.o.  Admit date: 09/18/2011 Discharge date: 09/28/2011  Admission Diagnoses: Acute respiratory failure  Discharge Diagnoses:  Active Problems:  BRONCHIECTASIS  Pneumonia  Respiratory failure, acute  Gout  Protein calorie malnutrition  Diarrhea  Anemia   Significant Hospital tests/ studies/ interventions and procedures   Micro/sepsis:  MRSA screen 4/12 > POSITIVE  4/12 BC x 2 >negative  4/13 Sputum CX >>nl flora  4/13 Stool c diff pcr > neg   Antibiotics:  4/12 Vanc(HCAP)>>4/15  4/12 Cefepime(HCAP)>>4/19  4/12 Cipro(HCAP)>>  Brief patient profile:   71 yowf with Bronchiectasis. MRSA colonization . Last sputum cx +Psuedomonas. Presented to clinic with 3 week hx of progressively worsening cough, hemoptysis , congestion and dyspnea unresponsive to OP abx. On arrival to office new onset of Hypoxia with sats ~77% (rechecked) . Admitted to SDU on 4/12 Satn on arrival 96% RA  Hospital Course:   Acute respiratory failure in setting of probable Rt sided PNA with Bronchiectasis. Has prior hx of MRSA, Pseudomonas, Pt is stable currently, no increased wob, and no bronchospasm on exam.  Plan -D9/14 abx, now on Cipro -f/u in our office 5-7 days  Chest Pain: describes as sharp, worse with inspiration, but worsened with palpation and movement. Sats are ok, and no increased wob. Suspect is due to chest wall pain from coughing.  Plan -supportive care  Discharge Exam:  BP 99/50  Pulse 68  Temp(Src) 97.8 F (36.6 C) (Oral)  Resp 18  Ht 5\' 5"  (1.651 m)  Wt 48.217 kg (106 lb 4.8 oz)  BMI 17.69 kg/m2  SpO2 92%  General - no distress, cachetic  Chest with a few scattered crackles, no wheezing  Cor with rrr  LE without edema, no cyanosis  Alert, oriented, moves all 4.    Labs at discharge Lab Results  Component Value Date   CREATININE 0.80 09/23/2011   BUN 6 09/23/2011   NA  139 09/23/2011   K 4.6 09/23/2011   CL 108 09/23/2011   CO2 25 09/23/2011   Lab Results  Component Value Date   WBC 9.1 09/23/2011   HGB 8.5* 09/23/2011   HCT 26.5* 09/23/2011   MCV 87.2 09/23/2011   PLT 425* 09/23/2011   Lab Results  Component Value Date   ALT 7 09/18/2011   AST 20 09/18/2011   ALKPHOS 127* 09/18/2011   BILITOT 0.1* 09/18/2011   No results found for this basename: INR,  PROTIME    Current radiology studies Dg Chest 2 View  09/27/2011  *RADIOLOGY REPORT*  Clinical Data: Left chest pain.  CHEST - 2 VIEW  Comparison: Plain films of the chest 09/22/2011 and 4th 13 13.  CT chest 10/09/2008.  Findings: There is chronic blunting of the costophrenic angles. Calcified granuloma in the left lower lobe is identified. Bronchiectatic change and chronic nodularity the right middle lobe and lingula are again seen.  There is no new airspace disease.  No evidence of pulmonary edema.  IMPRESSION: No acute finding.  Chronic opacities in the lingula and right middle lobe are suggestive of atypical infection such as MAI.  Original Report Authenticated By: Brooke [name]. Brooke [name], M.D.    Disposition:  06-Home-Health Care Svc  Discharge Orders    Future Appointments: Provider: Department: Dept Phone: Center:   10/05/2011 11:15 AM Brooke Sicks, NP [facility] [phone] None   10/14/2011 2:45 PM Brooke Frisk, MD [facility] [phone]  None     Future Orders Please Complete By Expires   Diet general      Call MD for:  temperature >100.4      Call MD for:  persistant nausea and vomiting      Call MD for:  severe uncontrolled pain      Call MD for:  difficulty breathing, headache or visual disturbances      (HEART FAILURE PATIENTS) Call MD:  Anytime you have any of the following symptoms: 1) 3 pound weight gain in 24 hours or 5 pounds in 1 week 2) shortness of breath, with or without a dry hacking cough 3) swelling in the hands, feet or stomach 4) if you have to sleep on extra  pillows at night in order to breathe.      Activity as tolerated - No restrictions      Discharge patient      Comments:   Discharge on 09/26/11     Medication List  As of 09/28/2011  2:32 PM   STOP taking these medications         BC HEADACHE POWDER PO      triamterene-hydrochlorothiazide 75-50 MG per tablet         TAKE these medications         albuterol 108 (90 BASE) MCG/ACT inhaler   Commonly known as: PROVENTIL HFA;VENTOLIN HFA   Inhale 2 puffs into the lungs every 6 (six) hours as needed for wheezing.      ALPRAZolam 0.5 MG tablet   Commonly known as: XANAX   Take 1 tablet (0.5 mg total) by mouth 3 (three) times daily as needed for anxiety.      benzonatate 200 MG capsule   Commonly known as: TESSALON   Take 1 capsule (200 mg total) by mouth 2 (two) times daily as needed for cough.      ciprofloxacin 750 MG tablet   Commonly known as: CIPRO   Take 1 tablet (750 mg total) by mouth 2 (two) times daily.      citalopram 20 MG tablet   Commonly known as: CELEXA   Take 20 mg by mouth daily.      dextromethorphan-guaiFENesin 30-600 MG per 12 hr tablet   Commonly known as: MUCINEX DM   Take 1 tablet by mouth 2 (two) times daily.      estrogens conjugated (synthetic A) 0.625 MG tablet   Commonly known as: CENESTIN   Take 0.625 mg by mouth daily.      HYDROcodone-acetaminophen 10-325 MG per tablet   Commonly known as: NORCO   1-2 tablets by mouth every 4 hours as needed      HYDROcodone-homatropine 5-1.5 MG/5ML syrup   Commonly known as: HYCODAN   Take 1 tsp every 4-6 hrs as needed for cough      levothyroxine 100 MCG tablet   Commonly known as: SYNTHROID, LEVOTHROID   Take 0.5 tablets (50 mcg total) by mouth daily before breakfast.      loperamide 2 MG capsule   Commonly known as: IMODIUM   Take 1 capsule (2 mg total) by mouth as needed. For diarrhea relief after each loose stool.      mulitivitamin with minerals Tabs   Take 1 tablet by mouth daily.       polysaccharide iron 150 MG Caps capsule   Commonly known as: NIFEREX   Take 150 mg by mouth daily.      promethazine 12.5 MG tablet   Commonly known  as: PHENERGAN   Take 1 tablet (12.5 mg total) by mouth every 6 (six) hours as needed for nausea.           Follow-up Information    Follow up with PARRETT,TAMMY, NP on 10/05/2011. (11:15 am )    Contact information:   [facility] [address] [place] [address] [phone]          Discharged Condition: fair  Physician Statement:   The Patient was personally examined, the discharge assessment and plan has been personally reviewed and I agree with ACNP Babcock's assessment and plan. > 30 minutes of time have been dedicated to discharge assessment, planning and discharge instructions.   SignedAnders [name] 09/28/2011, 2:32 PM   Attending Addendum:  I have seen the patient, discussed the issues, test results and plans with Brooke Shropshire, NP. I agree with the Assessment and Plans as ammended above.   Brooke Pupa, MD, PhD 09/28/2011, 4:19 PM [name] [phone] or if no answer [phone]

## 2011-09-28 NOTE — Progress Notes (Signed)
Spoke with patient at bedside. States she feels ready for d/c home today. She lives at her home, currently her dtr and grandchildren are living with her. She states home is stressful at this time due to her dtr going through a divorce. She talked about concerns related to a positive MRSA nasal swab and if it would effect her grandchildren. She has transportation and access to her meds. States dtr does most of the cooking and is very supportive. Denies any HH needs at this time.

## 2011-10-01 ENCOUNTER — Inpatient Hospital Stay: Payer: Medicare Other | Admitting: Adult Health

## 2011-10-05 ENCOUNTER — Inpatient Hospital Stay: Payer: Medicare Other | Admitting: Adult Health

## 2011-10-06 NOTE — Discharge Summary (Signed)
Levy Pupa, MD, PhD 10/06/2011, 8:22 AM [facility] [phone] or if no answer [phone]

## 2011-10-14 ENCOUNTER — Ambulatory Visit (INDEPENDENT_AMBULATORY_CARE_PROVIDER_SITE_OTHER): Payer: Medicare Other | Admitting: Critical Care Medicine

## 2011-10-14 ENCOUNTER — Encounter: Payer: Self-pay | Admitting: Critical Care Medicine

## 2011-10-14 ENCOUNTER — Ambulatory Visit (INDEPENDENT_AMBULATORY_CARE_PROVIDER_SITE_OTHER)
Admission: RE | Admit: 2011-10-14 | Discharge: 2011-10-14 | Disposition: A | Discharge status: Home / Self Care | Payer: Medicare Other | Source: Ambulatory Visit | Attending: Critical Care Medicine | Admitting: Critical Care Medicine

## 2011-10-14 DIAGNOSIS — J189 Pneumonia, unspecified organism: Secondary | ICD-10-CM

## 2011-10-14 DIAGNOSIS — J479 Bronchiectasis, uncomplicated: Secondary | ICD-10-CM

## 2011-10-14 MED ORDER — CIPROFLOXACIN HCL 750 MG PO TABS: 750.0000 mg | ORAL_TABLET | Freq: Two times a day (BID) | ORAL | Status: AC

## 2011-10-14 NOTE — Progress Notes (Signed)
Subjective:    Patient ID: Brooke [name], female    DOB: August 26, 1940, 71 y.o.   MRN: [id number]  HPI  71 y.o..WF with Bronchiectasis. MRSA colonization   4/12Acute OV  Does not feel any better with cough /congestion w/ hemoptysis. On arrival O2 sats 77% on room air.  >Admit to SDU at [facility]   10/14/2011 Pt dc from hosp two weeks ago .    Pt is still coughing.  THis is mostly at night.  Pt remains with thick yellow.  Pain in L side of chest and R sided chest pain.  Notes low grade fever.  Notes some low grade fever. Pt rx cefepime/vanco 4/12--4/17 then po cipro until dc and home  Now off all ABX.    Past Medical History  Diagnosis Date  . Hypothyroidism   . History of allergy   . Mycobacterium avium complex   . Bronchiectasis     colonized mrsa, s/p vancomyin IV 10 days 5/10 and 7 days 6/10  . Pneumonia   . Headache      No family history on file.   History   Social History  . Marital Status: Widowed    Spouse Name: N/A    Number of Children: N/A  . Years of Education: N/A   Occupational History  . Not on file.   Social History Main Topics  . Smoking status: Former Smoker -- 1.0 packs/day for 3 years    Types: Cigarettes    Quit date: 06/08/1962  . Smokeless tobacco: Never Used  . Alcohol Use: No  . Drug Use: No  . Sexually Active: Not on file   Other Topics Concern  . Not on file   Social History Narrative  . No narrative on file     Allergies  Allergen Reactions  . Doxycycline     vomiting  . Levofloxacin Other (See Comments)    Unknown rxn.  Tolerates Cipro  . Mometasone Furoate     REACTION: swollen throat, thrush  . Penicillins   . Sulfonamide Derivatives      Outpatient Prescriptions Prior to Visit  Medication Sig Dispense Refill  . albuterol (PROAIR HFA) 108 (90 BASE) MCG/ACT inhaler Inhale 2 puffs into the lungs every 6 (six) hours as needed for wheezing.  1 Inhaler  6  . ALPRAZolam (XANAX) 0.5 MG tablet Take 1 tablet (0.5 mg total) by mouth 3  (three) times daily as needed for anxiety.  90 tablet  4  . citalopram (CELEXA) 20 MG tablet Take 20 mg by mouth daily.       Marland Kitchen estrogens conjugated, synthetic A, (CENESTIN) 0.625 MG tablet Take 0.625 mg by mouth daily.      Marland Kitchen HYDROcodone-acetaminophen (NORCO) 10-325 MG per tablet 1-2 tablets by mouth every 4 hours as needed  60 tablet  0  . levothyroxine (SYNTHROID, LEVOTHROID) 100 MCG tablet Take 0.5 tablets (50 mcg total) by mouth daily before breakfast.  30 tablet  1  . loperamide (IMODIUM) 2 MG capsule Take 1 capsule (2 mg total) by mouth as needed. For diarrhea relief after each loose stool.  30 capsule  0  . HYDROcodone-homatropine (HYDROMET) 5-1.5 MG/5ML syrup Take 1 tsp every 4-6 hrs as needed for cough  180 mL  3  . Multiple Vitamin (MULITIVITAMIN WITH MINERALS) TABS Take 1 tablet by mouth daily.      . polysaccharide iron (NIFEREX) 150 MG CAPS capsule Take 150 mg by mouth daily.      . promethazine (  PHENERGAN) 12.5 MG tablet Take 1 tablet (12.5 mg total) by mouth every 6 (six) hours as needed for nausea.  30 tablet  0  . promethazine (PHENERGAN) 25 MG suppository Place 1 suppository (25 mg total) rectally every 6 (six) hours as needed for nausea.  20 each  1      Review of Systems  Constitutional:   No  weight loss, night sweats,   Fevers, chills, fatigue, lassitude. HEENT:   No headaches,  Difficulty swallowing,  Tooth/dental problems,  Sore throat,                No sneezing, itching, ear ache, nasal congestion, post nasal drip,   CV:  Notes  chest pain,  Orthopnea, PND, swelling in lower extremities, anasarca, dizziness, palpitations  GI  No heartburn, indigestion, abdominal pain,  nausea,  Neg  diarrhea, change in bowel habits, loss of appetite  Resp: Notes  shortness of breath with exertion not  at rest.  No  excess mucus, notes  productive cough,  No non-productive cough,  No coughing up of blood.  No  change in color of mucus.  No  wheezing.  No chest wall  deformity  Skin: no rash or lesions.  GU: no dysuria, change in color of urine, no urgency or frequency.  No flank pain.  MS:  No joint pain or swelling.  No decreased range of motion.  No back pain.  Psych:  No change in mood or affect. No depression or anxiety.  No memory loss.     Objective:   Physical Exam  Filed Vitals:   10/14/11 1448  BP: 96/56  Pulse: 83  Temp: 98.4 F (36.9 C)  TempSrc: Oral  Height: 5\' 5"  (1.651 m)  Weight: 103 lb 3.2 oz (46.811 kg)  SpO2: 90%    Gen: Thin and cachectic white female  ENT: No lesions,  mouth clear,  oropharynx clear,   Neck: No JVD, no TMG, no carotid bruits  Lungs: No use of accessory muscles, no dullness to percussion, reduced rhonchi, less wheeze  Cardiovascular: RRR, heart sounds normal, no murmur or gallops, no peripheral edema  Abdomen: soft and NT, no HSM,  BS normal  Musculoskeletal: No deformities, no cyanosis or clubbing  Neuro: alert, non focal  Skin: Warm, no lesions or rashes,  Site of picc line not infected, edema around elbow is fluid should resolve      Dg Chest 2 View  10/14/2011  *RADIOLOGY REPORT*  Clinical Data: Cough, short of breath, chest pain, former smoking history  CHEST - 2 VIEW  Comparison: Chest x-ray of 09/27/2011  Findings: There is parenchymal opacity in the right mid lung and at the right lung base suspicious for pneumonia in this patient with COPD.  Follow-up chest x-ray is recommended to ensure clearing. There is a nodular opacity anteriorly within the retrosternal air space on the lateral view, not seen previously possibly related to this pneumonia, but again attention to this area on follow-up chest x-ray is recommended.  A calcified granuloma at the left lung base is stable.  Mild cardiomegaly is stable.  No bony abnormality is seen.  IMPRESSION:  1.  Probable pneumonia in the right mid and upper lung field and at the right lung base. 2.  Nodular opacity on the lateral view may be related to  the pneumonia, but follow-up is recommended to ensure clearing. 3.  COPD.  Original Report Authenticated By: Juline Patch, M.D.    Assessment & Plan:  BRONCHIECTASIS Moderate bronchiectasis with recent exacerbation with associated pneumonia do to MRSA lung infection , Still with persistent RUL airspace disease Plan Maintain current pulmonary toilet 10days further Cipro  Pneumonia Persistent right upper lobe airspace disease with no positive microbiologic data on recent hospitalization Plan This is a challenging patient and that we cannot use standard oral antibiotics due to multiple antibiotic hypersensitivities Will plan to administer another course of 10 days of Cipro to judge response     Updated Medication List Outpatient Encounter Prescriptions as of 10/14/2011  Medication Sig Dispense Refill  . albuterol (PROAIR HFA) 108 (90 BASE) MCG/ACT inhaler Inhale 2 puffs into the lungs every 6 (six) hours as needed for wheezing.  1 Inhaler  6  . ALPRAZolam (XANAX) 0.5 MG tablet Take 1 tablet (0.5 mg total) by mouth 3 (three) times daily as needed for anxiety.  90 tablet  4  . citalopram (CELEXA) 20 MG tablet Take 20 mg by mouth daily.       Marland Kitchen estrogens conjugated, synthetic A, (CENESTIN) 0.625 MG tablet Take 0.625 mg by mouth daily.      Marland Kitchen HYDROcodone-acetaminophen (NORCO) 10-325 MG per tablet 1-2 tablets by mouth every 4 hours as needed  60 tablet  0  . levothyroxine (SYNTHROID, LEVOTHROID) 100 MCG tablet Take 0.5 tablets (50 mcg total) by mouth daily before breakfast.  30 tablet  1  . loperamide (IMODIUM) 2 MG capsule Take 1 capsule (2 mg total) by mouth as needed. For diarrhea relief after each loose stool.  30 capsule  0  . promethazine (PHENERGAN) 12.5 MG tablet Take 12.5 mg by mouth every 6 (six) hours as needed.      . triamterene-hydrochlorothiazide (MAXZIDE) 75-50 MG per tablet Take 0.5 tablets by mouth daily.      . ciprofloxacin (CIPRO) 750 MG tablet Take 1 tablet (750 mg  total) by mouth 2 (two) times daily.  20 tablet  0  . HYDROcodone-homatropine (HYDROMET) 5-1.5 MG/5ML syrup Take 1 tsp every 4-6 hrs as needed for cough  180 mL  3  . DISCONTD: Multiple Vitamin (MULITIVITAMIN WITH MINERALS) TABS Take 1 tablet by mouth daily.      Marland Kitchen DISCONTD: polysaccharide iron (NIFEREX) 150 MG CAPS capsule Take 150 mg by mouth daily.      Marland Kitchen DISCONTD: promethazine (PHENERGAN) 12.5 MG tablet Take 1 tablet (12.5 mg total) by mouth every 6 (six) hours as needed for nausea.  30 tablet  0  . DISCONTD: promethazine (PHENERGAN) 25 MG suppository Place 1 suppository (25 mg total) rectally every 6 (six) hours as needed for nausea.  20 each  1

## 2011-10-14 NOTE — Patient Instructions (Addendum)
No change in medications. Chest xray today Try to use Delsym or robitussin for cough rather than Hydromet Take cipro 750mg  twice daily for 10days Return in  2 months

## 2011-10-14 NOTE — Assessment & Plan Note (Addendum)
Moderate bronchiectasis with recent exacerbation with associated pneumonia do to MRSA lung infection , Still with persistent RUL airspace disease Plan Maintain current pulmonary toilet 10days further Cipro

## 2011-10-14 NOTE — Assessment & Plan Note (Addendum)
Persistent right upper lobe airspace disease with no positive microbiologic data on recent hospitalization Plan This is a challenging patient and that we cannot use standard oral antibiotics due to multiple antibiotic hypersensitivities Will plan to administer another course of 10 days of Cipro to judge response

## 2011-10-15 ENCOUNTER — Telehealth: Payer: Self-pay | Admitting: Critical Care Medicine

## 2011-10-15 DIAGNOSIS — J189 Pneumonia, unspecified organism: Secondary | ICD-10-CM

## 2011-10-15 NOTE — Telephone Encounter (Signed)
Have her worked in the [employer] in 7-10 days with repeat CXR Tell her PNA is d/t severity of underlying lung disease/bronchiectasis

## 2011-10-15 NOTE — Telephone Encounter (Signed)
Spoke with the pt's daughter. She states that she understands that her mother has PNA again, but she wants to know why PNA is so recurrent and if there is anything that she can do to help her avoid PNA in the future. She worries that this is coming from pt being so inactive. She also wants to know if the pt needs to be seen sooner than 2 months to followup on this. She would like PW to call her at his convenience to discuss. Thanks.

## 2011-10-15 NOTE — Telephone Encounter (Signed)
ATC Brooke [name] back, NA and VM box not set up so unable to leave msg, [facility]

## 2011-10-16 NOTE — Telephone Encounter (Signed)
Pt scheduled for appt on 10-23-11 at 10:45 with cxr. Carron Curie, CMA

## 2011-10-23 ENCOUNTER — Encounter: Payer: Self-pay | Admitting: Adult Health

## 2011-10-23 ENCOUNTER — Ambulatory Visit (INDEPENDENT_AMBULATORY_CARE_PROVIDER_SITE_OTHER): Payer: Medicare Other | Admitting: Adult Health

## 2011-10-23 ENCOUNTER — Ambulatory Visit (INDEPENDENT_AMBULATORY_CARE_PROVIDER_SITE_OTHER)
Admission: RE | Admit: 2011-10-23 | Discharge: 2011-10-23 | Disposition: A | Discharge status: Home / Self Care | Payer: Medicare Other | Source: Ambulatory Visit | Attending: Adult Health | Admitting: Adult Health

## 2011-10-23 VITALS — BP 106/56 | HR 74 | Temp 98.3°F | Ht 65.0 in | Wt 100.0 lb

## 2011-10-23 DIAGNOSIS — J189 Pneumonia, unspecified organism: Secondary | ICD-10-CM

## 2011-10-23 MED ORDER — AZELASTINE HCL 0.1 % NA SOLN: 2.0000 | Freq: Every day | NASAL | Status: DC

## 2011-10-23 NOTE — Patient Instructions (Signed)
Finish Cipro as directed.  Begin Claritin 5mg  daily  Begin Astelin 2 puffs At bedtime   Continue Saline nasal rinses  Follow up [name]  In 6 weeks as planned  Please contact office for sooner follow up if symptoms do not improve or worsen or seek emergency care

## 2011-10-23 NOTE — Progress Notes (Signed)
Subjective:    Patient ID: Brooke [name], female    DOB: 05/23/1941, 71 y.o.   MRN: [id number]  HPI  71 y.o..WF with Bronchiectasis. MRSA colonization  4/12Acute OV  Does not feel any better with cough /congestion w/ hemoptysis. On arrival O2 sats 77% on room air.  >Admit to SDU at [facility]   10/14/2011 Pt dc from hosp two weeks ago .    Pt is still coughing.  THis is mostly at night.  Pt remains with thick yellow.  Pain in L side of chest and R sided chest pain.  Notes low grade fever.  Notes some low grade fever. Pt rx cefepime/vanco 4/12--4/17 then po cipro until dc and home  Now off all ABX. >>cipro 750 x 10d  10/23/2011 Follow Up  Patient returns for a followup visit. Patient was diagnosed with a bronchiectatic exacerbation with pneumonia 10 days ago. X-ray showed a right-sided multifocal pneumonia. She was started on Cipro 750 mg twice daily for 10 days. Has 2 days left.   Patient returns today feeling better w/ less tightness and cough . But still very weak. No energy.  Appetite slightly better-but poor overall. No hemotpysis or chest pain . No edema.  X-ray  Today shows Interval regression but not complete resolution of multi focal right lung pneumonia superimposed on chronic lung disease Does complain of a lot of sinus drip and drainage.  She is accompanied by her daughter today.    Past Medical History  Diagnosis Date  . Hypothyroidism   . History of allergy   . Mycobacterium avium complex   . Bronchiectasis     colonized mrsa, s/p vancomyin IV 10 days 5/10 and 7 days 6/10  . Pneumonia   . Headache      No family history on file.   History   Social History  . Marital Status: Widowed    Spouse Name: N/A    Number of Children: N/A  . Years of Education: N/A   Occupational History  . Not on file.   Social History Main Topics  . Smoking status: Former Smoker -- 1.0 packs/day for 3 years    Types: Cigarettes    Quit date: 06/08/1962  . Smokeless tobacco: Never Used    . Alcohol Use: No  . Drug Use: No  . Sexually Active: Not on file   Other Topics Concern  . Not on file   Social History Narrative  . No narrative on file     Allergies  Allergen Reactions  . Doxycycline     vomiting  . Levofloxacin Other (See Comments)    Unknown rxn.  Tolerates Cipro  . Mometasone Furoate     REACTION: swollen throat, thrush  . Penicillins   . Sulfonamide Derivatives      Outpatient Prescriptions Prior to Visit  Medication Sig Dispense Refill  . albuterol (PROAIR HFA) 108 (90 BASE) MCG/ACT inhaler Inhale 2 puffs into the lungs every 6 (six) hours as needed for wheezing.  1 Inhaler  6  . ALPRAZolam (XANAX) 0.5 MG tablet Take 1 tablet (0.5 mg total) by mouth 3 (three) times daily as needed for anxiety.  90 tablet  4  . ciprofloxacin (CIPRO) 750 MG tablet Take 1 tablet (750 mg total) by mouth 2 (two) times daily.  20 tablet  0  . citalopram (CELEXA) 20 MG tablet Take 20 mg by mouth daily.       Marland Kitchen estrogens conjugated, synthetic A, (CENESTIN) 0.625 MG tablet Take  0.625 mg by mouth daily.      Marland Kitchen HYDROcodone-acetaminophen (NORCO) 10-325 MG per tablet 1-2 tablets by mouth every 4 hours as needed  60 tablet  0  . HYDROcodone-homatropine (HYDROMET) 5-1.5 MG/5ML syrup Take 1 tsp every 4-6 hrs as needed for cough  180 mL  3  . levothyroxine (SYNTHROID, LEVOTHROID) 100 MCG tablet Take 0.5 tablets (50 mcg total) by mouth daily before breakfast.  30 tablet  1  . loperamide (IMODIUM) 2 MG capsule Take 1 capsule (2 mg total) by mouth as needed. For diarrhea relief after each loose stool.  30 capsule  0  . promethazine (PHENERGAN) 12.5 MG tablet Take 12.5 mg by mouth every 6 (six) hours as needed.      . triamterene-hydrochlorothiazide (MAXZIDE) 75-50 MG per tablet Take 0.5 tablets by mouth daily.          Review of Systems  Constitutional:   No  weight loss, night sweats,  Fevers, chills, fatigue, lassitude. HEENT:   No headaches,  Difficulty swallowing,  Tooth/dental  problems,  Sore throat,                No sneezing, itching, ear ache,  +nasal congestion, post nasal drip,   CV:  Notes  chest pain,  Orthopnea, PND, swelling in lower extremities, anasarca, dizziness, palpitations  GI  No heartburn, indigestion, abdominal pain,  nausea,  Neg  diarrhea, change in bowel habits, loss of appetite  Resp: Notes  shortness of breath with exertion not  at rest.    No coughing up of blood.    No chest wall deformity  Skin: no rash or lesions.  GU: no dysuria, change in color of urine, no urgency or frequency.  No flank pain.  MS:  No joint pain or swelling.  No decreased range of motion.  No back pain.  Psych:  No change in mood or affect. No depression or anxiety.  No memory loss.     Objective:   Physical Exam     Gen: Thin and cachectic white female  ENT: No lesions,  mouth clear,  oropharynx clear,   Neck: No JVD, no TMG, no carotid bruits  Lungs: No use of accessory muscles, no dullness to percussion, coarse BS w/ few rhonchi   Cardiovascular: RRR, heart sounds normal, no murmur or gallops, no peripheral edema  Abdomen: soft and NT, no HSM,  BS normal  Musculoskeletal: No deformities, no cyanosis or clubbing  Neuro: alert, non focal  Skin: Warm, no lesions or rashes     No results found.  Assessment & Plan:      Updated Medication List Outpatient Encounter Prescriptions as of 10/23/2011  Medication Sig Dispense Refill  . albuterol (PROAIR HFA) 108 (90 BASE) MCG/ACT inhaler Inhale 2 puffs into the lungs every 6 (six) hours as needed for wheezing.  1 Inhaler  6  . ALPRAZolam (XANAX) 0.5 MG tablet Take 1 tablet (0.5 mg total) by mouth 3 (three) times daily as needed for anxiety.  90 tablet  4  . ciprofloxacin (CIPRO) 750 MG tablet Take 1 tablet (750 mg total) by mouth 2 (two) times daily.  20 tablet  0  . citalopram (CELEXA) 20 MG tablet Take 20 mg by mouth daily.       Marland Kitchen estrogens conjugated, synthetic A, (CENESTIN) 0.625 MG tablet  Take 0.625 mg by mouth daily.      Marland Kitchen HYDROcodone-acetaminophen (NORCO) 10-325 MG per tablet 1-2 tablets by mouth every 4 hours as  needed  60 tablet  0  . HYDROcodone-homatropine (HYDROMET) 5-1.5 MG/5ML syrup Take 1 tsp every 4-6 hrs as needed for cough  180 mL  3  . levothyroxine (SYNTHROID, LEVOTHROID) 100 MCG tablet Take 0.5 tablets (50 mcg total) by mouth daily before breakfast.  30 tablet  1  . loperamide (IMODIUM) 2 MG capsule Take 1 capsule (2 mg total) by mouth as needed. For diarrhea relief after each loose stool.  30 capsule  0  . promethazine (PHENERGAN) 12.5 MG tablet Take 12.5 mg by mouth every 6 (six) hours as needed.      . triamterene-hydrochlorothiazide (MAXZIDE) 75-50 MG per tablet Take 0.5 tablets by mouth daily.

## 2011-10-23 NOTE — Assessment & Plan Note (Signed)
Resolving PNA with underlying Bronchietasis .  cxr shows improved aeration  Have her return in 4-6 weeks with repeat xray   Plan  Finish Cipro  follow up with cxr in 4-6 weeks.  Add claritin and astelin for sinus drip

## 2011-10-27 ENCOUNTER — Telehealth: Payer: Self-pay | Admitting: Critical Care Medicine

## 2011-10-27 NOTE — Telephone Encounter (Signed)
Spoke with pt. She states that she has been running a fever "for a long time, all of my life it seems". She states that temp runs from 99-101.5. She c/o having no energy and continues to have prod cough with light yellow sputum. I offered ov but she refused, states that she just wants to go ahead and have the ct chest that PW had mentioned to her before. She wants an answer to why is feels bad all of the time and states maybe ct will help find this answer. PW, please advise, thanks!

## 2011-10-28 NOTE — Telephone Encounter (Signed)
CT Chest not indicated at this time. OV with me to regroup

## 2011-10-28 NOTE — Telephone Encounter (Signed)
I have reviewed this note . Crystal:  Please remind me of this issue on my return in the AM

## 2011-10-28 NOTE — Telephone Encounter (Signed)
LMTCB

## 2011-10-28 NOTE — Telephone Encounter (Signed)
Called, spoke with pt.  I informed her of below per [name].  I offered pt OV tomorrow with [name] in [place] but pt unable to come to [place].  I offered OV with TP this week but pt declined this stating she seen Tammy last.   I then offered pt OV on Friday, May 31 which is first available in [place] with [name] but pt unable to come on this day either.  Pt then stated "I will wait until my appointment July and maybe something horrible will happen."  Pt then went on to state she finished the cipro, cough was dry but is now wet, and coughing "all the time."  Cough is prod with yellowish green mucus, having pain in right upper chest, and having a low grade fever "everyday."  Pt states she is not better, not worse, but "feels horrible." I informed pt I would send msg to [name] to see if we can work her but pt declined this as well stating, "July 8th is just fine, and I will call back before then if I need to."  I tried to explain to pt that we do not want her feeling sick and want her to feel better but in order to do this we would need to see.  Once again, I advised I would see if we can work her in as she is having problems but pt was insistent that she would not come in if PW would work her in -- insisting to keep ov in July.  Therefore, I advised I would send msg to [name] so he is aware of this but she will need to call back if she changes her mind, symptoms worsen or do not improve.  She verbalized understanding.

## 2011-10-29 ENCOUNTER — Telehealth: Payer: Self-pay | Admitting: Critical Care Medicine

## 2011-10-29 DIAGNOSIS — J479 Bronchiectasis, uncomplicated: Secondary | ICD-10-CM

## 2011-10-29 DIAGNOSIS — E43 Unspecified severe protein-calorie malnutrition: Secondary | ICD-10-CM

## 2011-10-29 NOTE — Telephone Encounter (Signed)
Spoke with Brooke [name] and notified of recs per PW. She verbalized understanding and states nothing further needed.

## 2011-10-29 NOTE — Telephone Encounter (Signed)
Called and spoke and had long discussion regarding palliative care. She is eager to get started with this. I advised that the order has been sent already and that they should be contacting her very soon. She states that told her " I promise I will never leave you alone or let you be in pain". She states that she wants to ensure that this promise is kept. I assured her that Hospice would take great care of her and that the purpose of their service is to keep the pt comfortable and also to help with questions/concerns of the pt as well as the family. I advised that if her nurse feels that there is something needed, she will not hesitate to let us know and that she will be taken care of. She wants msg sent to PW telling him "please do not break your promise". I advised will send the msg to him. She states nothing further needed.   Yyvette called from Hospice right after the pt and I spoke- [phone]. She is asking if PW is attending and if okay for hospice docs to assist with symptom management.  Please advise, thanks!

## 2011-10-29 NOTE — Telephone Encounter (Signed)
i will be attending. Yes, need hospice MD help with symptom management

## 2011-10-29 NOTE — Telephone Encounter (Signed)
Discussed pts condition with the patient. I informed her a palliative care referral was in order. She is ok with this. We will hold off on CT Chest for now

## 2011-10-29 NOTE — Telephone Encounter (Signed)
[name] has spoken with pt.  Pls see phone msg from Oct 29, 2011 for additional information.  Note: Brooke [name], Brooke [name] [facility], did confirm hospice referral was received.

## 2011-10-30 ENCOUNTER — Telehealth: Payer: Self-pay | Admitting: Critical Care Medicine

## 2011-10-30 NOTE — Telephone Encounter (Signed)
That is incorrect information; I ordered a hospice consult. Yes, she is at that point this service would be helpful

## 2011-10-30 NOTE — Telephone Encounter (Signed)
I spoke with Scherry Ran (pt daughter) and she states the nurse came out to pt home yesterday and had informed them that palliative care is not done in home only through a skilled nursing facility/nursing home. She is wanting to know if PW really feels like pt is at that point where their are no other possible tx options for pt. Please advise [name] thanks

## 2011-10-30 NOTE — Telephone Encounter (Signed)
LMTCB

## 2011-11-02 ENCOUNTER — Telehealth: Payer: Self-pay | Admitting: Internal Medicine

## 2011-11-02 DIAGNOSIS — J479 Bronchiectasis, uncomplicated: Secondary | ICD-10-CM

## 2011-11-02 MED ORDER — CIPROFLOXACIN HCL 750 MG PO TABS: 750.0000 mg | ORAL_TABLET | Freq: Two times a day (BID) | ORAL | Status: AC

## 2011-11-02 NOTE — Telephone Encounter (Signed)
Chip Boer called re:  1) confused with role hospice will play ? Will we stop all her treatments and just keep her comfortable? 2) Fever resumed off cipro, up to 101.9 today  Rec Ok to start back cipro, push fluids if tolerates  Will ask Dr Delford Field to address her other concerns tomorrow

## 2011-11-03 ENCOUNTER — Telehealth: Payer: Self-pay | Admitting: Critical Care Medicine

## 2011-11-03 NOTE — Telephone Encounter (Signed)
Spoke with Vickie and notified of recs per PW. She verbalized understanding and states nothing further needed.  

## 2011-11-03 NOTE — Telephone Encounter (Signed)
Called, spoke with [facility] with Hospice at [phone].  She is aware [name] does want Hospice for pt, he will be attending, and does want hospice drs to assist with symptom management.  Bronson Ing verbalized understanding of this stating after [name] spoke with pt's daughter this morning this clarified things for pt and her daughter.  Pt's daughter called Hospice back this morning.  Now, hospice is scheduled to go back pt's home on Thursday.  Nothing further needed from Hospice at this time.  Will sign off on msg and route to [name] as Lorain Childes.

## 2011-11-03 NOTE — Telephone Encounter (Signed)
I spoke to the daughter to clarify

## 2011-11-03 NOTE — Telephone Encounter (Signed)
D.r Delford Field spoke with the pt daughter. He advised Crystal to call hospice and discuss order with them. I will forward message to Crystal for follow-up. Carron Curie, CMA

## 2011-11-05 ENCOUNTER — Telehealth: Payer: Self-pay | Admitting: Critical Care Medicine

## 2011-11-05 MED ORDER — METRONIDAZOLE 250 MG PO TABS: 250.0000 mg | ORAL_TABLET | Freq: Three times a day (TID) | ORAL | Status: AC

## 2011-11-05 MED ORDER — ONDANSETRON HCL 4 MG PO TABS: 4.0000 mg | ORAL_TABLET | Freq: Four times a day (QID) | ORAL | Status: DC | PRN

## 2011-11-05 NOTE — Telephone Encounter (Signed)
Call in  zofran 4mg  one 4 x daily prn nausea  #20 Flagyl 250mg  tid # 21 for diarrhea  Ok for hospice MD to sign dnr form

## 2011-11-05 NOTE — Telephone Encounter (Signed)
Rxs for flagyl and zofran were sent to pharm and Larene Beach is aware. Called Cassandra at both #'s listed- [facility] informing her that okay per PW to have Hospice Doc sign DNR. I asked her to call back to let us know that she did indeed receive the msg.

## 2011-11-05 NOTE — Telephone Encounter (Signed)
I spoke with Chip Boer and she states pt is having nausea and diarrhea x 11/02/11. Pt is having 4-7 loose bowels a day and has a strong odor to it. Pt has been taking OTC anti nausea medication and otc meds for the diarrhea w/o relief. She would like and rx called in for pt for both of these symptoms. Phenergan is on pt med list but per Chip Boer pt does not have this. Please advise [name] thanks  Allergies  Allergen Reactions  . Doxycycline     vomiting  . Levofloxacin Other (See Comments)    Unknown rxn.  Tolerates Cipro  . Mometasone Furoate     REACTION: swollen throat, thrush  . Penicillins   . Sulfonamide Derivatives

## 2011-11-05 NOTE — Telephone Encounter (Signed)
Chip Boer (daughter) called back and needs to pick up the rx @ 4:30 at the lastest today from pt's pharm is possible. Leanora Ivanoff

## 2011-11-05 NOTE — Telephone Encounter (Signed)
Cassandra from [facility] called.  Pt was admitted into hospice this morning.  They need form signed for DNR and want to know if it is ok for Hospice Dr to sign form.  #'s [phone] Leanora Ivanoff

## 2011-11-06 NOTE — Telephone Encounter (Signed)
Spoke with Brooke [name] and she states that she did receive the msg regarding DMR form and there is nothing further needed.

## 2011-11-06 NOTE — Telephone Encounter (Signed)
LMTCB for Brooke [name]

## 2011-11-10 MED ORDER — METRONIDAZOLE 250 MG PO TABS: 250.0000 mg | ORAL_TABLET | Freq: Three times a day (TID) | ORAL | Status: AC

## 2011-11-10 MED ORDER — CIPROFLOXACIN-DEXAMETHASONE 0.3-0.1 % OT SUSP: 1.0000 [drp] | Freq: Two times a day (BID) | OTIC | Status: AC

## 2011-11-10 MED ORDER — FLUCONAZOLE 100 MG PO TABS: 100.0000 mg | ORAL_TABLET | Freq: Every day | ORAL | Status: AC

## 2011-11-10 NOTE — Telephone Encounter (Signed)
Order home oxygen 2Liters via Insurance underwriter.  [facility] to provide.  Do best fit and home sats and exertional sats  Order Flagyl 250mg  tid x 7 days for diarrhea  Order diflucan 100mg   One daily for 7days for yeast  Order cipro otic solution 1 drop in affected ear bid  X 5days

## 2011-11-10 NOTE — Telephone Encounter (Signed)
Spoke with [name]. She states that the pt has several complaints.  1) rt ear pain with drainage (per pt) for 2 days (note she is currently taking another round of cipro) 2) vaginal itching and sore tongue- relates to frequent abx and requests something for yeast 3) diarrhea- 5-6 times per day, light tan in color with "strange odor". Causing her fatigue to worsen. Tried immodium w/o relief 4) Daughter requests the pt to have order for home o2 to keep her comfortable and also for when she walks from room to room.    [name], please advise, thanks!

## 2011-11-10 NOTE — Telephone Encounter (Signed)
Spoke with [name] and notified of recs per PW. She verbalized understanding and states nothing further needed. Order sent to [facility] for o2 and rxs sent to [facility].

## 2011-11-10 NOTE — Telephone Encounter (Signed)
[facility], [phone].  Would like to report pt's complaints:  right ear ache w/ drainage-hurts inside & out;  vaginal itchy & sore tongue-has been on several ABX; Bad diarrhea-stool strong, foul & light pale brown (5-6 times per day).  Daughter would like O2 as needed (home concentrator) for pt.  Brooke [name]

## 2011-11-12 ENCOUNTER — Telehealth: Payer: Self-pay | Admitting: Critical Care Medicine

## 2011-11-12 MED ORDER — ONDANSETRON HCL 4 MG PO TABS: 4.0000 mg | ORAL_TABLET | Freq: Four times a day (QID) | ORAL | Status: DC | PRN

## 2011-11-12 MED ORDER — TEMAZEPAM 15 MG PO CAPS: 15.0000 mg | ORAL_CAPSULE | Freq: Every evening | ORAL | Status: DC | PRN

## 2011-11-12 NOTE — Telephone Encounter (Signed)
Rx temazapam 15mg  po qhs prn sleep #30

## 2011-11-12 NOTE — Telephone Encounter (Signed)
Spoke with Brooke [name]. She is requesting refill on zofran for pt. I sent this to pharm. She is also asking for PW to prescribe something to help her sleep. Pt having difficulty falling asleep and staying asleep. She can not tolerate ambien and has tried benadryl and melatonin in the past w/o relief. RN is suggesting low dose temazepam since she has not tried this and it will be paid for by hospice. Please advise, thanks!

## 2011-11-12 NOTE — Telephone Encounter (Signed)
Rx was called to pharm and spoke with [name] and notified that this was done. She verbalized understanding and states nothing further needed.

## 2011-11-24 ENCOUNTER — Telehealth: Payer: Self-pay | Admitting: Critical Care Medicine

## 2011-11-24 MED ORDER — TRIAMTERENE-HCTZ 75-50 MG PO TABS: 0.5000 | ORAL_TABLET | Freq: Every day | ORAL | Status: DC

## 2011-11-24 NOTE — Telephone Encounter (Signed)
Spoke with [employer]. She wants to be sure that for the triam/hct 0.5 tablet daily is correct. I advised that it is. She states pt needs refill on this, rx was sent to pharm and nothing further needed.

## 2011-11-26 ENCOUNTER — Telehealth: Payer: Self-pay | Admitting: Critical Care Medicine

## 2011-11-26 MED ORDER — ALPRAZOLAM 0.5 MG PO TABS: 0.5000 mg | ORAL_TABLET | Freq: Three times a day (TID) | ORAL | Status: DC | PRN

## 2011-11-26 NOTE — Telephone Encounter (Signed)
Spoke with pt to verify the msg. Rx for alprazolam was called to pharm. Nothing further needed.

## 2011-12-01 ENCOUNTER — Telehealth: Payer: Self-pay | Admitting: Critical Care Medicine

## 2011-12-01 MED ORDER — CIPROFLOXACIN HCL 500 MG PO TABS: 500.0000 mg | ORAL_TABLET | Freq: Two times a day (BID) | ORAL | Status: AC

## 2011-12-01 MED ORDER — ALBUTEROL SULFATE HFA 108 (90 BASE) MCG/ACT IN AERS: 2.0000 | INHALATION_SPRAY | Freq: Four times a day (QID) | RESPIRATORY_TRACT | Status: DC | PRN

## 2011-12-01 NOTE — Telephone Encounter (Signed)
Called, spoke with [facility], Hospice Nurse.  She has several questions/concerns she would like answered today.  1.  Requesting ventolin rx to [facility] -- rx sent and [facility] aware. 2.  States pt "tumbles in the floor"onto her bottom x 2 nights ago.  Reports no bruising, redness, and pt able to move around with no difficulty.  Would like order for foam pillow. 3.  States pt has not slept well in the last 3-4 nights even when taking temazepam 15 mg.  Pt would like to know if this can be increased. 4.  States pt has a raspy voice and moist cough with a little bit of yellow sputum.  States pt "checks her temp all the time," and per pt, 98.4 is a temp for her.  [name] checked temp which was 99.4.  States pt did say if the dr felt she needed another abx she would do 1 more to get some stuff done that she wants to.  Reports pt is able to sit and talk with no SOB and is taking delsym tid.    As [name] is off until July, will forward msg to doc of the day -- Dr. Kriste Basque, pls advise.  Thank you.

## 2011-12-01 NOTE — Telephone Encounter (Signed)
Per SN---ok to call in cipro 500mg   #14  1 po bid until gone. thanks

## 2011-12-01 NOTE — Telephone Encounter (Signed)
Rx was sent and Rosey Bath aware. Nothing further needed

## 2011-12-01 NOTE — Telephone Encounter (Signed)
Per Dr. Kriste Basque,  1.  ok for foam pillow. 2.  Increase temazepam to 30mg  qhs prn. 3.  levaquin 500 mg 1 po qd # 7 no refills.    ---   Called, spoke with [facility].  She is aware of above recs per Dr. Kriste Basque and would like the Levaquin rx sent to [facility].    Dr. Kriste Basque, while trying to send Levaquin -- looks like this is on pt's allergy list.  Pls advise. Thank you.

## 2011-12-02 ENCOUNTER — Telehealth: Payer: Self-pay | Admitting: Critical Care Medicine

## 2011-12-02 ENCOUNTER — Other Ambulatory Visit: Payer: Self-pay | Admitting: Critical Care Medicine

## 2011-12-02 MED ORDER — METRONIDAZOLE 500 MG PO TABS: 500.0000 mg | ORAL_TABLET | Freq: Three times a day (TID) | ORAL | Status: AC

## 2011-12-02 NOTE — Telephone Encounter (Signed)
Spoke with Brooke [name] and notified that we will send the flagyl in for pt. She verbalized understanding and states nothing further needed. Rx was sent to pharm.

## 2011-12-02 NOTE — Telephone Encounter (Signed)
lmomtcb x1 for theres--

## 2011-12-02 NOTE — Telephone Encounter (Signed)
Per CY-okay to give Flagyl 500 mg #21 take 1 po tid x 7 days no refills.

## 2011-12-02 NOTE — Telephone Encounter (Signed)
Spoke with [employer] , pt's Hospice nurse. She states that the pt wants rx for flagyl since we prescribed her cipro yesterday. She refuses to start taking cipro without the flagyl b/c last time she took med she had diarrhea. Will forward to [facility] for recs since PW off. Please advise, thanks! Allergies  Allergen Reactions  . Ambien (Zolpidem Tartrate)     Sleep walking and altered mental status  . Doxycycline     vomiting  . Levofloxacin Other (See Comments)    Unknown rxn.  Tolerates Cipro  . Mometasone Furoate     REACTION: swollen throat, thrush  . Penicillins   . Sulfonamide Derivatives

## 2011-12-03 ENCOUNTER — Telehealth: Payer: Self-pay | Admitting: Critical Care Medicine

## 2011-12-03 MED ORDER — TEMAZEPAM 15 MG PO CAPS: 15.0000 mg | ORAL_CAPSULE | Freq: Every evening | ORAL | Status: DC | PRN

## 2011-12-03 NOTE — Telephone Encounter (Signed)
Dr Kriste Basque was out of the office spoke with CY AND HE OK TEMAZEPAM 15 MG <> 1 QHS PRN SLEEP. #30 X3  RX CALLED IN AND TERESA AWARE. NOTHING FURTHER WAS NEEDED.

## 2011-12-03 NOTE — Telephone Encounter (Signed)
Spoke with Rosey Bath (hospice Nurse) states  Per patient the temazepam 30 mg is to much for patient and would like to  Go back to the temazepam 15 mg she will also need a new rx for the temazepam 15 mg . Dr Kriste Basque is it ok to    Also, Dr Alphonsus Sias ordered robitussin Dm 2 tsp every 4 hours prn .

## 2011-12-05 ENCOUNTER — Telehealth: Payer: Self-pay | Admitting: Pulmonary Disease

## 2011-12-05 NOTE — Telephone Encounter (Signed)
Sat 12/05/11 at 1:45PM> Hospice nurse called stating pt c/o diarrhea- currently on Cipro & it did this before, but she anticipated this & is also on Flagyl called in at the same time... Told to STOP the Cipro, continue the Flagyl, increase fluid intake, use Immodium every 4H as needed, & start QUESTRAN 1 packet daily... She will need f/u w/ DrWright this coming week for recheck.Marland KitchenMarland Kitchen

## 2011-12-07 NOTE — Telephone Encounter (Signed)
Pt scheduled to see PW tomorrow at 2pm.Danyella Mcginty [place], CMA

## 2011-12-07 NOTE — Telephone Encounter (Signed)
Please have pt see TP this week or can workin with me

## 2011-12-08 ENCOUNTER — Encounter: Payer: Self-pay | Admitting: Critical Care Medicine

## 2011-12-08 ENCOUNTER — Ambulatory Visit (INDEPENDENT_AMBULATORY_CARE_PROVIDER_SITE_OTHER): Payer: Medicare Other | Admitting: Critical Care Medicine

## 2011-12-08 VITALS — BP 102/78 | HR 86 | Temp 98.4°F | Ht 65.0 in | Wt 99.8 lb

## 2011-12-08 DIAGNOSIS — J479 Bronchiectasis, uncomplicated: Secondary | ICD-10-CM

## 2011-12-08 NOTE — Assessment & Plan Note (Signed)
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

## 2011-12-08 NOTE — Patient Instructions (Signed)
Finish current course of flagyl/cipro then observe No other medication changes Continue to follow with Hospice Return 3 months

## 2011-12-08 NOTE — Progress Notes (Signed)
Subjective:    Patient ID: Brooke [name], female    DOB: December 10, 1940, 71 y.o.   MRN: [id number]  HPI  71 y.o..WF with Bronchiectasis. MRSA colonization  4/12Acute OV  Does not feel any better with cough /congestion w/ hemoptysis. On arrival O2 sats 77% on room air.  >Admit to SDU at [facility]   10/14/2011 Pt dc from hosp two weeks ago .    Pt is still coughing.  THis is mostly at night.  Pt remains with thick yellow.  Pain in L side of chest and R sided chest pain.  Notes low grade fever.  Notes some low grade fever. Pt rx cefepime/vanco 4/12--4/17 then po cipro until dc and home  Now off all ABX. >>cipro 750 x 10d  5/17 Follow Up  Patient returns for a followup visit. Patient was diagnosed with a bronchiectatic exacerbation with pneumonia 10 days ago. X-ray showed a right-sided multifocal pneumonia. She was started on Cipro 750 mg twice daily for 10 days. Has 2 days left.   Patient returns today feeling better w/ less tightness and cough . But still very weak. No energy.  Appetite slightly better-but poor overall. No hemotpysis or chest pain . No edema.  X-ray  Today shows Interval regression but not complete resolution of multi focal right lung pneumonia superimposed on chronic lung disease Does complain of a lot of sinus drip and drainage.  She is accompanied by her daughter today.   12/08/2011 Pt notes the diarrhea is better.  Rx imodium and helps.  Pt stayed on the cipro.  Has a few left. Hospice is beneficial.  Pain control is ok.  Never takes more than two pain pills per day. Mucus now is sl green but not dark green.  Yellowish green now.    Past Medical History  Diagnosis Date  . Hypothyroidism   . History of allergy   . Mycobacterium avium complex   . Bronchiectasis     colonized mrsa, s/p vancomyin IV 10 days 5/10 and 7 days 6/10  . Pneumonia   . Headache      No family history on file.   History   Social History  . Marital Status: Widowed    Spouse Name: N/A    Number  of Children: N/A  . Years of Education: N/A   Occupational History  . Not on file.   Social History Main Topics  . Smoking status: Former Smoker -- 1.0 packs/day for 3 years    Types: Cigarettes    Quit date: 06/08/1962  . Smokeless tobacco: Never Used  . Alcohol Use: No  . Drug Use: No  . Sexually Active: Not on file   Other Topics Concern  . Not on file   Social History Narrative  . No narrative on file     Allergies  Allergen Reactions  . Ambien (Zolpidem Tartrate)     Sleep walking and altered mental status  . Doxycycline     vomiting  . Levofloxacin Other (See Comments)    Unknown rxn.  Tolerates Cipro  . Mometasone Furoate     REACTION: swollen throat, thrush  . Penicillins   . Sulfonamide Derivatives      Outpatient Prescriptions Prior to Visit  Medication Sig Dispense Refill  . albuterol (VENTOLIN HFA) 108 (90 BASE) MCG/ACT inhaler Inhale 2 puffs into the lungs every 6 (six) hours as needed for wheezing.  1 Inhaler  3  . ALPRAZolam (XANAX) 0.5 MG tablet Take 1 tablet (0.5  mg total) by mouth 3 (three) times daily as needed for anxiety.  90 tablet  1  . azelastine (ASTELIN) 137 MCG/SPRAY nasal spray Place 2 sprays into the nose at bedtime. Use in each nostril as directed  30 mL  12  . ciprofloxacin (CIPRO) 500 MG tablet Take 1 tablet (500 mg total) by mouth 2 (two) times daily.  14 tablet  0  . citalopram (CELEXA) 20 MG tablet Take 20 mg by mouth daily.       Marland Kitchen HYDROcodone-acetaminophen (NORCO) 10-325 MG per tablet 1-2 tablets by mouth every 4 hours as needed  60 tablet  0  . levothyroxine (SYNTHROID, LEVOTHROID) 100 MCG tablet Take 0.5 tablets (50 mcg total) by mouth daily before breakfast.  30 tablet  1  . loperamide (IMODIUM) 2 MG capsule Take 1 capsule (2 mg total) by mouth as needed. For diarrhea relief after each loose stool.  30 capsule  0  . metroNIDAZOLE (FLAGYL) 500 MG tablet Take 1 tablet (500 mg total) by mouth 3 (three) times daily.  21 tablet  0  .  temazepam (RESTORIL) 15 MG capsule Take 1 capsule (15 mg total) by mouth at bedtime as needed for sleep.  30 capsule  3  . triamterene-hydrochlorothiazide (MAXZIDE) 75-50 MG per tablet Take 0.5 tablets by mouth daily.  15 tablet  5  . albuterol (PROAIR HFA) 108 (90 BASE) MCG/ACT inhaler Inhale 2 puffs into the lungs every 6 (six) hours as needed for wheezing.  1 Inhaler  6  . dextromethorphan (DELSYM) 30 MG/5ML liquid Take 60 mg by mouth 2 (two) times daily.      Marland Kitchen estrogens conjugated, synthetic A, (CENESTIN) 0.625 MG tablet Take 0.625 mg by mouth daily.      Marland Kitchen HYDROcodone-homatropine (HYDROMET) 5-1.5 MG/5ML syrup Take 1 tsp every 4-6 hrs as needed for cough  180 mL  3  . promethazine (PHENERGAN) 12.5 MG tablet Take 12.5 mg by mouth every 6 (six) hours as needed.          Review of Systems  Constitutional:   No  weight loss, night sweats,  Fevers, chills, fatigue, lassitude. HEENT:   No headaches,  Difficulty swallowing,  Tooth/dental problems,  Sore throat,                No sneezing, itching, ear ache,  +nasal congestion, post nasal drip,   CV:  Notes  chest pain,  Orthopnea, PND, swelling in lower extremities, anasarca, dizziness, palpitations  GI  No heartburn, indigestion, abdominal pain,  nausea,  Neg  diarrhea, change in bowel habits, loss of appetite  Resp: Notes  shortness of breath with exertion not  at rest.    No coughing up of blood.    No chest wall deformity  Skin: no rash or lesions.  GU: no dysuria, change in color of urine, no urgency or frequency.  No flank pain.  MS:  No joint pain or swelling.  No decreased range of motion.  No back pain.  Psych:  No change in mood or affect. No depression or anxiety.  No memory loss.     Objective:   Physical Exam   BP 102/78  Pulse 86  Temp 98.4 F (36.9 C) (Oral)  Ht 5\' 5"  (1.651 m)  Wt 99 lb 12.8 oz (45.269 kg)  BMI 16.61 kg/m2  SpO2 93%   Gen: Thin and cachectic white female  ENT: No lesions,  mouth clear,   oropharynx clear,   Neck: No JVD,  no TMG, no carotid bruits  Lungs: No use of accessory muscles, no dullness to percussion, coarse BS w/ few rhonchi   Cardiovascular: RRR, heart sounds normal, no murmur or gallops, no peripheral edema  Abdomen: soft and NT, no HSM,  BS normal  Musculoskeletal: No deformities, no cyanosis or clubbing  Neuro: alert, non focal  Skin: Warm, no lesions or rashes     No results found.  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 albuterol (VENTOLIN HFA) 108 (90 BASE) MCG/ACT inhaler Inhale 2 puffs into the lungs every 6 (six) hours as needed for wheezing.  1 Inhaler  3  . ALPRAZolam (XANAX) 0.5 MG tablet Take 1 tablet (0.5 mg total) by mouth 3 (three) times daily as needed for anxiety.  90 tablet  1  . azelastine (ASTELIN) 137 MCG/SPRAY nasal spray Place 2 sprays into the nose at bedtime. Use in each nostril as directed  30 mL  12  . ciprofloxacin (CIPRO) 500 MG tablet Take 1 tablet (500 mg total) by mouth 2 (two) times daily.  14 tablet  0  . citalopram (CELEXA) 20 MG tablet Take 20 mg by mouth daily.       Marland Kitchen Dextromethorphan-Guaifenesin (ROBITUSSIN DM) 10-100 MG/5ML liquid Take by mouth every 4 (four) hours as needed.      Marland Kitchen HYDROcodone-acetaminophen (NORCO) 10-325 MG per tablet 1-2 tablets by mouth every 4 hours as needed  60 tablet  0  . levothyroxine (SYNTHROID, LEVOTHROID) 100 MCG tablet Take 0.5 tablets (50 mcg total) by mouth daily before breakfast.  30 tablet  1  . loperamide (IMODIUM) 2 MG capsule Take 1 capsule (2 mg total) by mouth as needed. For diarrhea relief after each loose stool.  30 capsule  0  . metroNIDAZOLE  (FLAGYL) 500 MG tablet Take 1 tablet (500 mg total) by mouth 3 (three) times daily.  21 tablet  0  . ondansetron (ZOFRAN) 4 MG tablet Take 1 tablet by mouth as needed.      . temazepam (RESTORIL) 15 MG capsule Take 1 capsule (15 mg total) by mouth at bedtime as needed for sleep.  30 capsule  3  . triamterene-hydrochlorothiazide (MAXZIDE) 75-50 MG per tablet Take 0.5 tablets by mouth daily.  15 tablet  5  . DISCONTD: albuterol (PROAIR HFA) 108 (90 BASE) MCG/ACT inhaler Inhale 2 puffs into the lungs every 6 (six) hours as needed for wheezing.  1 Inhaler  6  . DISCONTD: dextromethorphan (DELSYM) 30 MG/5ML liquid Take 60 mg by mouth 2 (two) times daily.      Marland Kitchen DISCONTD: estrogens conjugated, synthetic A, (CENESTIN) 0.625 MG tablet Take 0.625 mg by mouth daily.      Marland Kitchen DISCONTD: HYDROcodone-homatropine (HYDROMET) 5-1.5 MG/5ML syrup Take 1 tsp every 4-6 hrs as needed for cough  180 mL  3  . DISCONTD: promethazine (PHENERGAN) 12.5 MG tablet Take 12.5 mg by mouth every 6 (six) hours as needed.

## 2011-12-11 ENCOUNTER — Telehealth: Payer: Self-pay | Admitting: Critical Care Medicine

## 2011-12-11 NOTE — Telephone Encounter (Signed)
Hospice called back and she stated that the pt did fall on 7-4 in the bathroom and hit her side.  Teres, RN  Stated that she does not see any bruising, no flailing and the pt is not in any obvious pain but states that the pain is severe.  Pt is tender to touch but refused to go to the ER or urgent care for xrays.  RN stated that the hospice MD called in refill of her hydrocodone 10/325 #60  On 6/24 and the pt only has 1 tablet left today.  Pt stated that she has only been taking 1/2 to 1 tablet a day most days until her fall and is now taking 1-2 tabs daily since yesterday.  They are requesting a refill of the pain meds. pts daughter is normally present when they arrive and is normally alert, but today she noticed that the daughter could hardly stay awake and per teress she has also been in rehab in the past.   PW is out of the office today so will forward to RA for ok to refill meds.   Please advise. Thanks  Allergies  Allergen Reactions  . Ambien (Zolpidem Tartrate)     Sleep walking and altered mental status  . Doxycycline     vomiting  . Levofloxacin Other (See Comments)    Unknown rxn.  Tolerates Cipro  . Mometasone Furoate     REACTION: swollen throat, thrush  . Penicillins   . Sulfonamide Derivatives

## 2011-12-11 NOTE — Telephone Encounter (Signed)
Teres called & stated that [facility] (pt's pharmacy) closes at 6:00 pm today-requests this be called in ASAP.  Thanks!  [name]

## 2011-12-11 NOTE — Telephone Encounter (Signed)
Advised Mindy of this -- will forward msg to RA to address ASAP.  Thank you

## 2011-12-11 NOTE — Telephone Encounter (Signed)
Ok to refill but hospice will need to monitor this carefully

## 2011-12-11 NOTE — Telephone Encounter (Signed)
As Dr. Sherene Sires is in office seeing pts and almost finished, will forward msg to him to address given hospice is requesting this be taken care of ASAP

## 2011-12-11 NOTE — Telephone Encounter (Signed)
Called and spoke with therese from hospice and she is aware that per MW ok to call in for the refill of the hydrocodone but hospice will need to keep a close eye on her meds.  She is aware and they will monitor her meds closely.  Nothing further was needed.

## 2011-12-11 NOTE — Telephone Encounter (Signed)
I spoke with Brooke [name] and she states she received a report from the on call nurse advising okay to increase pt hydrocodone since she had a fall. She states our on call doctor had okay'd this. I do not see this in chart. She states she is going to look over the report and call us back. Will await call back

## 2011-12-14 ENCOUNTER — Ambulatory Visit: Payer: Medicare Other | Admitting: Critical Care Medicine

## 2011-12-18 ENCOUNTER — Telehealth: Payer: Self-pay | Admitting: Critical Care Medicine

## 2011-12-18 MED ORDER — HYDROCODONE-ACETAMINOPHEN 10-325 MG PO TABS: ORAL_TABLET | ORAL | Status: DC

## 2011-12-18 NOTE — Telephone Encounter (Signed)
rx has been called to the pharmacy for #40 for a 2 week supply.  Called and spoke with pt and she is aware.

## 2011-12-18 NOTE — Telephone Encounter (Signed)
PW---i spoke with therese and she is aware that the pain meds will be called in.  Would you like to change the directions on the hydrocodone and how many would you like me to call in for a 2 week supply?  Pt may take 1-3 daily.  thanks

## 2011-12-18 NOTE — Telephone Encounter (Signed)
Same as before pls  1-2 every 6h prn

## 2011-12-18 NOTE — Telephone Encounter (Signed)
Called and spoke with therese from hospice-- she stated that she went to see the pt today and they pt is still c/o pain in the rib area.  She still does not want to be seen for this.  She did state that she will be out of her hydrocodone on Monday and wants this called in for her today.  therese stated that this was last filled on 7/5 by MW  For #60.  therese stated that the pt did state that she has been taking on "bad days" maybe 4 daily but therese states that the pt has been very alert at their recent visits and she is not sure if the pt has been taking this many in a day.  therese stated that she is not sure if we should only give 2 weeks at a time of the hydrocodone so we can keep a closer eye on this med.  PW please advise. Thanks.

## 2011-12-18 NOTE — Telephone Encounter (Signed)
Ok to refill two week supply  I am concerned for a while the daughter uses her meds

## 2011-12-22 ENCOUNTER — Telehealth: Payer: Self-pay | Admitting: Critical Care Medicine

## 2011-12-22 MED ORDER — HYDROCODONE-ACETAMINOPHEN 10-325 MG PO TABS: ORAL_TABLET | ORAL | Status: DC

## 2011-12-22 NOTE — Telephone Encounter (Signed)
Spoke with TP-pt is hospice pt and SN will sign Rx for Vicodin 1-2 every 6 hours with qty enough to cover until PW returns. Also, okay for patient to have Simethacone 1 before each meal for bloating or as directed.

## 2011-12-22 NOTE — Telephone Encounter (Signed)
Spoke with [place] and notified of recs per PW. She verbalized understanding and states nothing further needed. Rx was called to pharm. Nothing further needed.

## 2011-12-22 NOTE — Telephone Encounter (Signed)
Called, spoke with Angelica Chessman, who is filling in for pt's primary nurse Theres this week.  She has a few questions:  1.  Requesting a tablet order for gas; pt does not want to take anything liquid for this - ? Simethacone.  Pls advise.  Thank you. 2.  Requesting refill for xanax.  Xanax rx was last called into [facility] on 11/26/11 # 90 x 1.  Spoke with Aneta Mins with [facility] who did verify pt does have a refill left and will get ready for pt.  Many aware pt has refill remaining at [facility] and they will get it ready for pt.  3.  Has concerns on the hydrocodone 10/325:  States they have this med as 1-2 tabs q4h prn and this is what the pt thought that she should be taking.  However, per last rx, pt should be taking this 1-2 q6h prn.  Requesting clarification of this bc pt has only approx 4 days left on this medication because she thought she could take it q4h prn.  She is aware [name] is off this week and does not want pt to run out.  She is requesting some insight on this. Will forward msg to Dr. Kriste Basque as doc of the day -- pls advise.  Thank you.

## 2011-12-25 ENCOUNTER — Other Ambulatory Visit: Payer: Self-pay | Admitting: Critical Care Medicine

## 2011-12-29 ENCOUNTER — Telehealth: Payer: Self-pay | Admitting: Critical Care Medicine

## 2011-12-29 NOTE — Telephone Encounter (Signed)
I spoke with Mordecai Maes and she was calling to give FYI pt stop taking cenestin on her own. She needed [place] to take this off pt med list. i advised her according to our list of meds in [facility] this was not on her med list. Will forward to [name] as an Lorain Childes.

## 2011-12-30 ENCOUNTER — Telehealth: Payer: Self-pay | Admitting: Critical Care Medicine

## 2011-12-30 NOTE — Telephone Encounter (Signed)
noted 

## 2011-12-30 NOTE — Telephone Encounter (Signed)
Spoke with [employer] , pt's Hospice nurse. She states that the pt needs refills on temazepam and also for hydrocodone. She states that she wants more than # 40 tablets of the hydrocodone this time. Nurse also wants to know if okay to have the rx for zofran changed to compazine. She states that Hospice will pay for the compazine. Lastly, the pt c/o watery, grainy, foul smelling stolls x 3 days- Immodium helps during the day, but she wakes up in the night and sometimes can not make it to the restroom. Brooke [name] states fine with a call back tomorrow. Please advise, thanks!

## 2011-12-30 NOTE — Telephone Encounter (Signed)
All of this is fine 

## 2011-12-31 MED ORDER — PROCHLORPERAZINE MALEATE 10 MG PO TABS: 10.0000 mg | ORAL_TABLET | Freq: Four times a day (QID) | ORAL | Status: DC | PRN

## 2011-12-31 MED ORDER — TEMAZEPAM 15 MG PO CAPS: 15.0000 mg | ORAL_CAPSULE | Freq: Every evening | ORAL | Status: DC | PRN

## 2011-12-31 MED ORDER — HYDROCODONE-ACETAMINOPHEN 10-325 MG PO TABS: ORAL_TABLET | ORAL | Status: DC

## 2011-12-31 NOTE — Telephone Encounter (Signed)
I spoke with Brooke [name] and advised that I will send in rx for temazepam, norco, and compazine. Carron Curie, CMA

## 2012-01-08 ENCOUNTER — Telehealth: Payer: Self-pay | Admitting: Critical Care Medicine

## 2012-01-08 ENCOUNTER — Ambulatory Visit: Payer: Medicare Other | Admitting: Critical Care Medicine

## 2012-01-08 MED ORDER — HYDROCODONE-ACETAMINOPHEN 10-325 MG PO TABS: ORAL_TABLET | ORAL | Status: DC

## 2012-01-08 MED ORDER — DEXTROMETHORPHAN-GUAIFENESIN 10-100 MG/5ML PO SYRP: 5.0000 mL | ORAL_SOLUTION | ORAL | Status: DC | PRN

## 2012-01-08 NOTE — Telephone Encounter (Signed)
Spoke with [name] and notified of recs per PW Is it okay to send in refill on the vicodin? She now only has 5 tablets left.

## 2012-01-08 NOTE — Telephone Encounter (Signed)
Called spoke with [name] w/ hospice who is requesting a rx for robitussin be sent to [facility] pharmacy.  Also, is reporting that #60 of vicodin was filled on 7/25.  Went through 5 and 1/2 tabs yesterday.  Patient believes her daughter is the source.  Patient only uses 1/2-1 tab at a time.  [name] stated that she recommended to the patient to place the vicodin in a lock box and to keep the key with her at all times.  There is a teenage granddaughter in the house but pt did not indicate her.  Would like to know what PW thinks.    Rx for robitusson printed for PW to sign.

## 2012-01-08 NOTE — Telephone Encounter (Signed)
The daughter, i believe, has been using her mother's Rx for yrs.  I agree with robitussin and the lock box

## 2012-01-08 NOTE — Telephone Encounter (Signed)
The robitussin rx was printed but can be telephoned into the pharmacy.  Robitussin and vicodin telephoned to [facility] pharmacy.  Spoke with Darl Pikes pharmacist - made her aware this is a hospice patient.  Per Laqueta Linden does not need a return phone call.  Will sign off.

## 2012-01-08 NOTE — Telephone Encounter (Signed)
yes

## 2012-01-13 ENCOUNTER — Telehealth: Payer: Self-pay | Admitting: Critical Care Medicine

## 2012-01-13 MED ORDER — ALPRAZOLAM 0.5 MG PO TABS: 0.5000 mg | ORAL_TABLET | Freq: Three times a day (TID) | ORAL | Status: DC | PRN

## 2012-01-13 NOTE — Telephone Encounter (Signed)
I have called rx into the pharmacy and i spoke with therese and is aware I have called rx into [facility]. Nothing further was needed

## 2012-01-13 NOTE — Telephone Encounter (Signed)
Contacted [name] with Hospice. She states patient needs a refill on her Xanax. Nothing further needed. Patient last seen 12/08/11. [name] please advise if ok to refill. Thanks.

## 2012-01-13 NOTE — Telephone Encounter (Signed)
This is ok

## 2012-01-19 ENCOUNTER — Telehealth: Payer: Self-pay | Admitting: Critical Care Medicine

## 2012-01-19 NOTE — Telephone Encounter (Signed)
Called spoke with [name] who stated that pt has increased her norco use from 2-3 tabs daily to 3-5 1/2 tabs daily and has run out of her rx.  Last given #60 on 8.2.13.  Pt does keep this in a lock box.  Is requesting a refill on this medication.  PW not in office this week, will forward to doc of the day.  Dr Delton Coombes please advise, thanks.  *norco 10-325mg  1-2 every 4 hours.  [name] stated pt does have enough for today but will need tomorrow morning.  Per pt's chart, 2 additional refills were given when PW last filled on 01-08-12.  Called [facility] and spoke with pharmacist Darl Pikes who stated that pt still has a refill on file.  Darl Pikes will go ahead and fill this.  Nothing further needed; will sign off.

## 2012-01-19 NOTE — Telephone Encounter (Signed)
Called and set up 3 month rov with PW for 10/7, but pt states that PW told her unless something was wrong she did not have to come back to him since she is now in Hospice's care.  Please call pt and let her know if she does need to come in for her Oct appt or not. Leanora Ivanoff

## 2012-01-26 ENCOUNTER — Telehealth: Payer: Self-pay | Admitting: Critical Care Medicine

## 2012-01-26 MED ORDER — MIRTAZAPINE 30 MG PO TABS: 30.0000 mg | ORAL_TABLET | Freq: Every day | ORAL | Status: DC

## 2012-01-26 NOTE — Telephone Encounter (Signed)
Spoke with [name]. She states that the pt threw her temazepam away after sleep walking and eating unknowingly last night. She states that med worked well for a while, but she no longer wants to take this and is requesting alternative. Can not tolerate ambien, this caused the same symptoms. Please advise, thanks!

## 2012-01-26 NOTE — Telephone Encounter (Signed)
Trial remeron 25mg  qhs

## 2012-01-26 NOTE — Telephone Encounter (Signed)
Dr Delford Field, the remeron comes in 15, 30 or 45 mg. Please advise which strength to use, thanks

## 2012-01-26 NOTE — Telephone Encounter (Signed)
30mg.

## 2012-01-26 NOTE — Telephone Encounter (Signed)
Brooke [name] returned triage's call.  Brooke [name]

## 2012-01-26 NOTE — Telephone Encounter (Signed)
LMTCB for Brooke [name] 

## 2012-01-26 NOTE — Telephone Encounter (Signed)
Spoke with [name] and notified of recs per PW. She verbalized understanding and states nothing further needed.  Rx was called to [facility] MAR updated

## 2012-02-02 ENCOUNTER — Telehealth: Payer: Self-pay | Admitting: Critical Care Medicine

## 2012-02-02 NOTE — Telephone Encounter (Signed)
LMTCB

## 2012-02-03 NOTE — Telephone Encounter (Signed)
Left message on Brooke [name]'s phone as she is in a meeting from 1030 to 1230 pm today-will check messages often per our phone convo earlier, that ok to get sputum culture from patient.

## 2012-02-03 NOTE — Telephone Encounter (Signed)
Obtain sputum c/s

## 2012-02-03 NOTE — Telephone Encounter (Signed)
Spoke with Jaymes Graff with hospice-states patient has had increased cough-now green in color-was yellow last week; SOB with activity as well.Pt told Jaymes Graff she has a fever of 99.5([name] states this is normal for patient) and thinks she may have infection. PW please advise if you would like to have sputum culture on patient.

## 2012-02-10 ENCOUNTER — Telehealth: Payer: Self-pay | Admitting: Critical Care Medicine

## 2012-02-10 NOTE — Telephone Encounter (Signed)
i do not have the results  It is not in epic

## 2012-02-10 NOTE — Telephone Encounter (Signed)
Sputum results are in Dr. Lynelle Doctor to do folder to address.  Called, spoke with [name].  Would like to know if [name] would like to place any orders based on sputum results from last week.  States pt's temp was 99 last week.  Pt had cough last week with olive green sputum.  All other symptoms were at baseline.  She will visit pt today.  [name] Ok for call back tomorrow regarding recs when PW will return to office.  [name], pls advise.  Thank you.  Note:  When calling [name] back, please leave a DETAILED msg on number provided above per her request because once she receives orders she will have to get orders approved for payment.

## 2012-02-11 ENCOUNTER — Telehealth: Payer: Self-pay | Admitting: Critical Care Medicine

## 2012-02-11 MED ORDER — CIPROFLOXACIN HCL 500 MG PO TABS: 500.0000 mg | ORAL_TABLET | Freq: Two times a day (BID) | ORAL | Status: AC

## 2012-02-11 MED ORDER — METRONIDAZOLE 250 MG PO TABS: 250.0000 mg | ORAL_TABLET | Freq: Three times a day (TID) | ORAL | Status: AC

## 2012-02-11 NOTE — Telephone Encounter (Signed)
No results in epic.  It states below that the results are PW's lookat, but he does not have them.  Crystal, have you seen this?  Pt's daughter is calling back.  Thanks.

## 2012-02-11 NOTE — Telephone Encounter (Signed)
Best non narc cough syrup is delsym Also ok to rx flagyl 250mg  tid #7days

## 2012-02-11 NOTE — Telephone Encounter (Signed)
I spoke with Brooke [name] and advised of results and that medication has been sent.  [name] then states that the pt always asks for flagyl with an antibiotic so can we send in an RX for that as well. Also the pt has been taking robutussin for her cough but it is not working well for her so they are also requesting an Rx for cough syrup that does not have a narcotic in it. They have tried delsym in the past as well.  Please advise. Carron Curie, CMA

## 2012-02-11 NOTE — Telephone Encounter (Signed)
LMOMTCB for [employer], hospice nurse.

## 2012-02-11 NOTE — Telephone Encounter (Signed)
Please call Lynden Ang (daughter) at [phone]. They have not heard anything yet. They want to know something today.

## 2012-02-11 NOTE — Telephone Encounter (Signed)
[name] aware of PW recs for Delsym and RX for Flagyl. They will call if pt's symptoms do not improve.

## 2012-02-11 NOTE — Telephone Encounter (Signed)
Let pt and her hospice RN know Klebsiella seen on sputum culture  I sent :   cipro  500mg  bid x 10days to her pharmacy

## 2012-02-11 NOTE — Telephone Encounter (Signed)
[name] w/ Hopsice called again re: results. Also says pt is coughing "a lot"- greenish yellow mucus- pt wants rx for cough- nothing narcotic; however. [name] @ [phone]. [name]

## 2012-02-11 NOTE — Telephone Encounter (Signed)
See new phone note with results. Carron Curie, CMA

## 2012-02-15 ENCOUNTER — Telehealth: Payer: Self-pay | Admitting: Critical Care Medicine

## 2012-02-15 MED ORDER — PREDNISONE 10 MG PO TABS: ORAL_TABLET | ORAL | Status: DC

## 2012-02-15 MED ORDER — FLUTICASONE PROPIONATE 50 MCG/ACT NA SUSP: NASAL | Status: DC

## 2012-02-15 NOTE — Telephone Encounter (Signed)
Spoke with [name]. She states that pt has sinus infection- c/o head congestion, facial pressure, ears stopped up, and low grade temp. Pt is currently taking flagyl and cipro for lung infection. Please advise recs thanks! Allergies  Allergen Reactions  . Ambien (Zolpidem Tartrate)     Sleep walking and altered mental status  . Doxycycline     vomiting  . Levofloxacin Other (See Comments)    Unknown rxn.  Tolerates Cipro  . Mometasone Furoate     REACTION: swollen throat, thrush  . Penicillins   . Sulfonamide Derivatives

## 2012-02-15 NOTE — Telephone Encounter (Signed)
Finish abx rx prednisone 10mg  Take 4 for three days 3 for three days 2 for three days 1 for three days and stop #30 Rx flonase two puff ea nostril daily #1 cannister

## 2012-02-15 NOTE — Telephone Encounter (Signed)
I spoke with Brooke [name] and is aware of PW recs. She asked these rx's be called into [facility]. I have done so. Nothing further was needed

## 2012-02-18 ENCOUNTER — Telehealth: Payer: Self-pay | Admitting: Critical Care Medicine

## 2012-02-18 MED ORDER — LEVOTHYROXINE SODIUM 100 MCG PO TABS: 50.0000 ug | ORAL_TABLET | Freq: Every day | ORAL | Status: DC

## 2012-02-18 MED ORDER — HYDROCODONE-ACETAMINOPHEN 10-325 MG PO TABS: ORAL_TABLET | ORAL | Status: DC

## 2012-02-18 NOTE — Telephone Encounter (Signed)
Per CY-try allaway or visine AC OTC eye drops.

## 2012-02-18 NOTE — Telephone Encounter (Signed)
Rx refills called in to [facility] with [name] and notified of recs per CDY.  She verbalized understanding. Nothing further needed.

## 2012-02-18 NOTE — Telephone Encounter (Signed)
Spoke with pt's Hospice nurse [name].  Pt needing levothyroxine refilled and also norco- both on med list so will refill  She states pt is asking for rx to help with allergies- eyes are itchy, red, watery and has some matting occ- has tried allegra in the past without relief. Please advise in PW's absence, thanks!! Allergies  Allergen Reactions  . Ambien (Zolpidem Tartrate)     Sleep walking and altered mental status  . Doxycycline     vomiting  . Levofloxacin Other (See Comments)    Unknown rxn.  Tolerates Cipro  . Mometasone Furoate     REACTION: swollen throat, thrush  . Penicillins   . Sulfonamide Derivatives

## 2012-02-22 ENCOUNTER — Telehealth: Payer: Self-pay | Admitting: Critical Care Medicine

## 2012-02-22 NOTE — Telephone Encounter (Signed)
I spoke with Brooke [name] and advised of recs, rx has not been sent yet. Dr. Vassie Loll please advise on strength and directions for pyridium, thanks. Carron Curie, CMA

## 2012-02-22 NOTE — Telephone Encounter (Signed)
Pyridium ok ZYrtec for allergies daily Defer antibiotic question to PW - take mucinex 600 bid meantime

## 2012-02-22 NOTE — Telephone Encounter (Signed)
PW Hospice Patient--I spoke with Jaymes Graff, hospice nurse and she states the pt is c/o the following:  1)She finished Cipro 500mg  1 bidx 10 days on 02-21-12 but she is still having productive cough with thick green phlegm. She denies any increase in SOB at this time. Sh eis having some sinus pressure and congestion as well.  Jaymes Graff feels the pt may need a different antibiotic.   2) The pt is also c/o having itchy, runny, red eyes. The pt has tried visine drops and allegra without relief. Pt feels she need an allergy medication.   3) The pt has been c/o having bladder spasm and the end of urination and Jaymes Graff is asking for an order for peridium. Please advise. Carron Curie, CMA  Allergies  Allergen Reactions  . Ambien (Zolpidem Tartrate)     Sleep walking and altered mental status  . Doxycycline     vomiting  . Levofloxacin Other (See Comments)    Unknown rxn.  Tolerates Cipro  . Mometasone Furoate     REACTION: swollen throat, thrush  . Penicillins   . Sulfonamide Derivatives

## 2012-02-23 ENCOUNTER — Telehealth: Payer: Self-pay | Admitting: Critical Care Medicine

## 2012-02-23 MED ORDER — PHENAZOPYRIDINE HCL 100 MG PO TABS: 100.0000 mg | ORAL_TABLET | Freq: Three times a day (TID) | ORAL | Status: DC | PRN

## 2012-02-23 NOTE — Telephone Encounter (Signed)
Spoke with therese with Hospice and given Dr Lynelle Doctor recommendations regarding Pyridium.  Rx sent to [facility] pharmacy.

## 2012-02-23 NOTE — Telephone Encounter (Signed)
  teress aware that on 02/18/12 Norco 10/325 was called to the pharmacy for #60 with 1 additional refill. She will contact the pharmacy for this and nothing further is needed at this time.

## 2012-02-23 NOTE — Telephone Encounter (Signed)
100mg  tid pyridium

## 2012-03-01 ENCOUNTER — Other Ambulatory Visit: Payer: Self-pay | Admitting: Critical Care Medicine

## 2012-03-01 ENCOUNTER — Telehealth: Payer: Self-pay | Admitting: Critical Care Medicine

## 2012-03-01 MED ORDER — ALUM & MAG HYDROXIDE-SIMETH 500-450-40 MG/5ML PO SUSP: 5.0000 mL | ORAL | Status: DC | PRN

## 2012-03-01 MED ORDER — PREDNISONE (PAK) 5 MG PO TABS: ORAL_TABLET | ORAL | Status: DC

## 2012-03-01 NOTE — Telephone Encounter (Signed)
I spoke with PAm at hospice and she is aware of recs.Carron Curie, CMA

## 2012-03-01 NOTE — Telephone Encounter (Signed)
PW Hospice Patient--I spoke with the pt and she is c/o having a productive cough with green phlegm, swollen eyes, nasal congestion and sinus pressure as well. Pt has tried Careers adviser, zyrtec, and claritin without relief. Pt is requesting a prednisone taper. She states this is the only thing that helps. Also the pt is asking for an rx for Maalox acid relief as well. Please advise.Carron Curie, CMA Allergies  Allergen Reactions  . Ambien (Zolpidem Tartrate)     Sleep walking and altered mental status  . Doxycycline     vomiting  . Levofloxacin Other (See Comments)    Unknown rxn.  Tolerates Cipro  . Mometasone Furoate     REACTION: swollen throat, thrush  . Penicillins   . Sulfonamide Derivatives

## 2012-03-01 NOTE — Telephone Encounter (Signed)
Per SN-ok to give maalox rx, and give medrol dose pak #1 as directed and f/u with [name]. Carron Curie, CMA  Rx sent. The pt is aware. The number for lexxa is incorrect so I called hospice main # at [phone] and [facility] with triage nurse to advise them of recs as well. Carron Curie, CMA

## 2012-03-01 NOTE — Telephone Encounter (Signed)
Duplicate message. Brooke [name], CMA  

## 2012-03-02 ENCOUNTER — Telehealth: Payer: Self-pay | Admitting: Critical Care Medicine

## 2012-03-02 NOTE — Telephone Encounter (Signed)
Spoke with Brooke [name]-she was unaware that Pam at hospice was called yesterday and states that nothing more is needed.

## 2012-03-04 ENCOUNTER — Telehealth: Payer: Self-pay | Admitting: Critical Care Medicine

## 2012-03-04 ENCOUNTER — Telehealth: Payer: Self-pay | Admitting: Pulmonary Disease

## 2012-03-04 NOTE — Telephone Encounter (Signed)
[name] from [facility] called & stated she would like to speak w/ PW's nurse.  Stated that she believes that pt is trying to dictate her care.  Stated that SN prescribed pt prednisone 5 mg & pt refuses to take because pt believes it is not a high enough dosage.  Also, would like to discuss hydrocodone Rx.  [name]

## 2012-03-04 NOTE — Telephone Encounter (Signed)
Jaymes Graff called back directly to Triage; states that pt tole her she was given pred 5mg  tablets at one daily; I confirmed with Jaymes Graff that SN called in Pred pak 5mg  tablets with #21. She will take care of this with pharmacy. Also, states that pt needed refills of hydrocodone-I explained that refill was sent on 02-18-12 for #60 with 1 refill.

## 2012-03-04 NOTE — Telephone Encounter (Signed)
LMTCB

## 2012-03-04 NOTE — Telephone Encounter (Signed)
Duplicate message.  Brooke [name] ° °

## 2012-03-07 ENCOUNTER — Telehealth: Payer: Self-pay | Admitting: Critical Care Medicine

## 2012-03-07 MED ORDER — PHENAZOPYRIDINE HCL 100 MG PO TABS: 100.0000 mg | ORAL_TABLET | Freq: Three times a day (TID) | ORAL | Status: DC | PRN

## 2012-03-07 NOTE — Telephone Encounter (Signed)
Called spoke with [name] who reports that pt had "a really dramatic day today" w/ complaints of fluid retention (5# weight gain w/in the last month, though hospice nurse denies any sign of fluid rentention) and pt feels like she has a "fragment of the life that she had while on the higher dose of prednisone".  Pt has significant crackles in the posterior bases and her sats were 88 on RA and 93 on O2.  Wants pyridium ordered again and vicodin for a higher quantity.  Feels that pt may need to be seen to address these issues > pt has not been seen since 12/2011, follow up in 3 months.  Refuses to see any other physician other than PW.  PW not in [place] office this week.  In [place] on 10.3.13 but full.  Crystal, is there any way pt can be seen in the [place] office on Thursday? Pyridium rx for #10 tid prn signed by MR and faxed to [facility] pharmacy.  [name] is aware.

## 2012-03-08 NOTE — Telephone Encounter (Signed)
Ok to double book

## 2012-03-08 NOTE — Telephone Encounter (Signed)
I spoke with daughter and she is scheduled to come in 03/10/12 at 9:30. She is aware of address and phone # for [place]. Nothing further was needed

## 2012-03-08 NOTE — Telephone Encounter (Signed)
Pt's daughter returning call can be reached at [phone] or [phone].Brooke [name]

## 2012-03-08 NOTE — Telephone Encounter (Signed)
Brooke [name] would like the status of an appt. Pt wants an appt w/ PW only.  Pt 's daughter drives pt to appts & must be home by 2:15 pm at the latest due to children getting off the school bus.    [phone].

## 2012-03-08 NOTE — Telephone Encounter (Signed)
Crystal or PW, please advise if the pt can be worked in on [employer] in [place], thanks!!

## 2012-03-08 NOTE — Telephone Encounter (Signed)
I called Mordecai Maes to scheduled appt. She advised will need to call pt daughter since they have to be back by 2:15 to pick up the children. I called Chip Boer adn had to lm tcb

## 2012-03-08 NOTE — Telephone Encounter (Signed)
Called, spoke with [name].  States pt only really trusts PW and didn't want to see anyone else.  [name] believes pt needs to be seen this week for below.  Also, states one minute she wants comfort care but then another minute she wants tx for several different things.  [name] aware I will send msg to PW to see if we can double book pt on Thursday in [place] and ok with call back tomorrow if this does not get answered by PW today.  She did state that pt will need an am appt.  [name], are you ok with double booking pt on Thursday am in [place]?

## 2012-03-09 ENCOUNTER — Telehealth: Payer: Self-pay | Admitting: Critical Care Medicine

## 2012-03-09 NOTE — Telephone Encounter (Signed)
Refills have not been sent on a cough syrup since 8.5.13 and this was for Robitussin DM.  Called spoke with patient who stated that she picked up this rx from [facility] and the bottle has a picture of a child on it.  Advised will call [facility] pharmacy to check on this.  Called [facility] pharmacy and spoke with pharmacist Aneta Mins who reported that even though the bottle of delsym has a child on the label, that it is for children and adults at 30mg /61mL.  The rx was written on 02-11-12 > per pt's chart, verbal order for delsym was given to hospice nurse w/ abx.    Called spoke with patient and informed her of the above.  Pt okay with these recs and verbalized her understanding and denied any further questions - will keep upcoming ov w/ PW tomorrow 10.3.13 in [place] office.  MAR updated w/ delsym.

## 2012-03-10 ENCOUNTER — Ambulatory Visit (INDEPENDENT_AMBULATORY_CARE_PROVIDER_SITE_OTHER): Payer: Medicare Other | Admitting: Critical Care Medicine

## 2012-03-10 ENCOUNTER — Encounter: Payer: Self-pay | Admitting: Critical Care Medicine

## 2012-03-10 ENCOUNTER — Telehealth: Payer: Self-pay | Admitting: *Deleted

## 2012-03-10 VITALS — BP 102/60 | HR 75 | Temp 98.2°F | Ht 65.0 in | Wt 98.0 lb

## 2012-03-10 DIAGNOSIS — J479 Bronchiectasis, uncomplicated: Secondary | ICD-10-CM

## 2012-03-10 DIAGNOSIS — N390 Urinary tract infection, site not specified: Secondary | ICD-10-CM

## 2012-03-10 DIAGNOSIS — J471 Bronchiectasis with (acute) exacerbation: Secondary | ICD-10-CM

## 2012-03-10 LAB — CBC WITH DIFFERENTIAL/PLATELET
Basophils Relative: 0 % (ref 0–1)
Eosinophils Absolute: 0.4 10*3/uL (ref 0.0–0.7)
Eosinophils Relative: 4 % (ref 0–5)
Lymphs Abs: 3.5 10*3/uL (ref 0.7–4.0)
MCH: 27.7 pg (ref 26.0–34.0)
MCHC: 32.7 g/dL (ref 30.0–36.0)
MCV: 84.7 fL (ref 78.0–100.0)
Platelets: 542 10*3/uL — ABNORMAL HIGH (ref 150–400)
RBC: 3.72 MIL/uL — ABNORMAL LOW (ref 3.87–5.11)
RDW: 15.3 % (ref 11.5–15.5)

## 2012-03-10 MED ORDER — HYDROCODONE-HOMATROPINE 5-1.5 MG/5ML PO SYRP: 5.0000 mL | ORAL_SOLUTION | ORAL | Status: DC | PRN

## 2012-03-10 MED ORDER — AZELASTINE-FLUTICASONE 137-50 MCG/ACT NA SUSP: 1.0000 | Freq: Two times a day (BID) | NASAL | Status: DC

## 2012-03-10 MED ORDER — MOXIFLOXACIN HCL 400 MG PO TABS: 400.0000 mg | ORAL_TABLET | Freq: Every day | ORAL | Status: DC

## 2012-03-10 NOTE — Patient Instructions (Addendum)
A Foley catheter will be ordered Use Dymista one puff twice daily each nostril Start Avelox daily for 14days plus hycodan for cough suppression Stop flonase Obtain lab and urine culture today No other medication changes I will follow you by phone with the hospice RN

## 2012-03-10 NOTE — Progress Notes (Signed)
Subjective:    Patient ID: Brooke [name], female    DOB: Nov 12, 1940, 71 y.o.   MRN: [id number]  HPI  71 y.o..WF with Bronchiectasis. MRSA colonization  4/12Acute OV  Does not feel any better with cough /congestion w/ hemoptysis. On arrival O2 sats 77% on room air.  >Admit to SDU at [facility]   10/14/2011 Pt dc from hosp two weeks ago .    Pt is still coughing.  THis is mostly at night.  Pt remains with thick yellow.  Pain in L side of chest and R sided chest pain.  Notes low grade fever.  Notes some low grade fever. Pt rx cefepime/vanco 4/12--4/17 then po cipro until dc and home  Now off all ABX. >>cipro 750 x 10d  5/17 Follow Up  Patient returns for a followup visit. Patient was diagnosed with a bronchiectatic exacerbation with pneumonia 10 days ago. X-ray showed a right-sided multifocal pneumonia. She was started on Cipro 750 mg twice daily for 10 days. Has 2 days left.   Patient returns today feeling better w/ less tightness and cough . But still very weak. No energy.  Appetite slightly better-but poor overall. No hemotpysis or chest pain . No edema.  X-ray  Today shows Interval regression but not complete resolution of multi focal right lung pneumonia superimposed on chronic lung disease Does complain of a lot of sinus drip and drainage.  She is accompanied by her daughter today.   12/08/2011 Pt notes the diarrhea is better.  Rx imodium and helps.  Pt stayed on the cipro.  Has a few left. Hospice is beneficial.  Pain control is ok.  Never takes more than two pain pills per day. Mucus now is sl green but not dark green.  Yellowish green now.  03/10/2012 Now in hospice with decline. Pain meds helps.  Pt coughing up thick dark mucus.  Bladder is not continent.  Pt coughs all day and night.  Pain in chest area.  No fever.   Now is constipated , senna.   Dyspnea at rest.  Not able walk anymore.  Not very mobile.   Past Medical History  Diagnosis Date  . Hypothyroidism   . History of allergy    . Mycobacterium avium complex   . Bronchiectasis     colonized mrsa, s/p vancomyin IV 10 days 5/10 and 7 days 6/10  . Pneumonia   . Headache      No family history on file.   History   Social History  . Marital Status: Widowed    Spouse Name: N/A    Number of Children: N/A  . Years of Education: N/A   Occupational History  . Not on file.   Social History Main Topics  . Smoking status: Former Smoker -- 1.0 packs/day for 3 years    Types: Cigarettes    Quit date: 06/08/1962  . Smokeless tobacco: Never Used  . Alcohol Use: No  . Drug Use: No  . Sexually Active: Not on file   Other Topics Concern  . Not on file   Social History Narrative  . No narrative on file     Allergies  Allergen Reactions  . Ambien (Zolpidem Tartrate)     Sleep walking and altered mental status  . Doxycycline     vomiting  . Levofloxacin Other (See Comments)    Unknown rxn.  Tolerates Cipro  . Mometasone Furoate     REACTION: nasonex causes swollen throat, thrush  . Penicillins   .  Sulfonamide Derivatives      Outpatient Prescriptions Prior to Visit  Medication Sig Dispense Refill  . albuterol (VENTOLIN HFA) 108 (90 BASE) MCG/ACT inhaler Inhale 2 puffs into the lungs every 6 (six) hours as needed for wheezing.  1 Inhaler  3  . ALPRAZolam (XANAX) 0.5 MG tablet Take 1 tablet (0.5 mg total) by mouth 3 (three) times daily as needed for anxiety.  90 tablet  1  . aluminum & magnesium hydroxide-simethicone (MYLANTA) 500-450-40 MG/5ML suspension Take 5 mLs by mouth as needed for indigestion.  360 mL  0  . citalopram (CELEXA) 20 MG tablet Take 20 mg by mouth daily.       . fluticasone (FLONASE) 50 MCG/ACT nasal spray 2 sprays each nostril daily-hospice  16 g  0  . HYDROcodone-acetaminophen (NORCO) 10-325 MG per tablet 1-2 tablets by mouth every 4 hours as needed  60 tablet  1  . levothyroxine (SYNTHROID, LEVOTHROID) 100 MCG tablet Take 0.5 tablets (50 mcg total) by mouth daily before breakfast.   15 tablet  5  . loperamide (IMODIUM) 2 MG capsule Take 1 capsule (2 mg total) by mouth as needed. For diarrhea relief after each loose stool.  30 capsule  0  . phenazopyridine (PYRIDIUM) 100 MG tablet Take 1 tablet (100 mg total) by mouth 3 (three) times daily as needed for pain.  10 tablet  0  . dextromethorphan (DELSYM) 30 MG/5ML liquid Take 60 mg by mouth as needed.      . ondansetron (ZOFRAN) 4 MG tablet Take 1 tablet by mouth as needed.      . ondansetron (ZOFRAN) 4 MG tablet TAKE ONE (1) TABLET BY MOUTH FOUR (4)   TIMES DAILY AS NEEDED FOR NAUSEA  20 tablet  1  . triamterene-hydrochlorothiazide (MAXZIDE) 75-50 MG per tablet Take 0.5 tablets by mouth daily.  15 tablet  5  . azelastine (ASTELIN) 137 MCG/SPRAY nasal spray Place 2 sprays into the nose at bedtime. Use in each nostril as directed  30 mL  12  . prochlorperazine (COMPAZINE) 10 MG tablet Take 1 tablet (10 mg total) by mouth every 6 (six) hours as needed.  30 tablet  6  . Dextromethorphan-Guaifenesin (ROBITUSSIN DM) 10-100 MG/5ML liquid Take 5 mLs by mouth every 4 (four) hours as needed.  480 mL  3  . mirtazapine (REMERON) 30 MG tablet Take 1 tablet (30 mg total) by mouth at bedtime.  30 tablet  5  . predniSONE (DELTASONE) 10 MG tablet Take 4 tablets/day x 3 days, 3 tablets/day x 3 days, 2 tablets/day x 3 days, 1 tablet/day x 3 days then stop-hospice  30 tablet  0  . predniSONE (STERAPRED UNI-PAK) 5 MG TABS As directed, 6 day pak  21 tablet  0      Review of Systems  Constitutional:   No  weight loss, night sweats,  Fevers, chills, fatigue, lassitude. HEENT:   No headaches,  Difficulty swallowing,  Tooth/dental problems,  Sore throat,                No sneezing, itching, ear ache,  +nasal congestion, post nasal drip,   CV:  Notes  chest pain,  Orthopnea, PND, swelling in lower extremities, anasarca, dizziness, palpitations  GI  No heartburn, indigestion, abdominal pain,  nausea,  Neg  diarrhea, change in bowel habits, loss of  appetite  Resp: Notes  shortness of breath with exertion not  at rest.    No coughing up of blood.  No chest wall deformity  Skin: no rash or lesions.  GU: no dysuria, change in color of urine, no urgency or frequency.  No flank pain.  MS:  No joint pain or swelling.  No decreased range of motion.  No back pain.  Psych:  No change in mood or affect. No depression or anxiety.  No memory loss.     Objective:   Physical Exam   BP 102/60  Pulse 75  Temp 98.2 F (36.8 C) (Oral)  Ht 5\' 5"  (1.651 m)  Wt 44.453 kg (98 lb)  BMI 16.31 kg/m2  SpO2 91%   Gen: Thin and cachectic white female  ENT: No lesions,  mouth clear,  oropharynx clear,   Neck: No JVD, no TMG, no carotid bruits  Lungs: No use of accessory muscles, no dullness to percussion, coarse BS w/ few rhonchi   Cardiovascular: RRR, heart sounds normal, no murmur or gallops, no peripheral edema  Abdomen: soft and NT, no HSM,  BS normal  Musculoskeletal: No deformities, no cyanosis or clubbing  Neuro: alert, non focal  Skin: Warm, no lesions or rashes      Lab 03/10/12 1047  HGB 10.3*  HCT 31.5*  WBC 11.0*  PLT 542*     Assessment & Plan:     BRONCHIECTASIS Severe endstage bronchiectasis with chronic respiratory failure   Plan A Foley catheter will be ordered Use Dymista one puff twice daily each nostril Start Avelox daily for 14days plus hycodan for cough suppression Stop flonase No other medication changes     Updated Medication List Outpatient Encounter Prescriptions as of 03/10/2012  Medication Sig Dispense Refill  . albuterol (VENTOLIN HFA) 108 (90 BASE) MCG/ACT inhaler Inhale 2 puffs into the lungs every 6 (six) hours as needed for wheezing.  1 Inhaler  3  . ALPRAZolam (XANAX) 0.5 MG tablet Take 1 tablet (0.5 mg total) by mouth 3 (three) times daily as needed for anxiety.  90 tablet  1  . aluminum & magnesium hydroxide-simethicone (MYLANTA) 500-450-40 MG/5ML suspension Take 5 mLs by mouth  as needed for indigestion.  360 mL  0  . citalopram (CELEXA) 20 MG tablet Take 20 mg by mouth daily.       . fluticasone (FLONASE) 50 MCG/ACT nasal spray 2 sprays each nostril daily-hospice  16 g  0  . HYDROcodone-acetaminophen (NORCO) 10-325 MG per tablet 1-2 tablets by mouth every 4 hours as needed  60 tablet  1  . levothyroxine (SYNTHROID, LEVOTHROID) 100 MCG tablet Take 0.5 tablets (50 mcg total) by mouth daily before breakfast.  15 tablet  5  . loperamide (IMODIUM) 2 MG capsule Take 1 capsule (2 mg total) by mouth as needed. For diarrhea relief after each loose stool.  30 capsule  0  . ondansetron (ZOFRAN) 4 MG tablet       . phenazopyridine (PYRIDIUM) 100 MG tablet Take 1 tablet (100 mg total) by mouth 3 (three) times daily as needed for pain.  10 tablet  0  . traZODone (DESYREL) 100 MG tablet Take 100 mg by mouth at bedtime.      . triamterene-hydrochlorothiazide (MAXZIDE) 75-50 MG per tablet Take 1 tablet by mouth daily.      Marland Kitchen DISCONTD: dextromethorphan (DELSYM) 30 MG/5ML liquid Take 60 mg by mouth as needed.      Marland Kitchen DISCONTD: ondansetron (ZOFRAN) 4 MG tablet Take 1 tablet by mouth as needed.      Marland Kitchen DISCONTD: ondansetron (ZOFRAN) 4 MG tablet TAKE ONE (1)  TABLET BY MOUTH FOUR (4)   TIMES DAILY AS NEEDED FOR NAUSEA  20 tablet  1  . DISCONTD: triamterene-hydrochlorothiazide (MAXZIDE) 75-50 MG per tablet Take 0.5 tablets by mouth daily.  15 tablet  5  . azelastine (ASTELIN) 137 MCG/SPRAY nasal spray Place 2 sprays into the nose at bedtime. Use in each nostril as directed  30 mL  12  . Azelastine-Fluticasone (DYMISTA) 137-50 MCG/ACT SUSP Place 1 puff into the nose 2 (two) times daily.  7 g  1  . HYDROcodone-homatropine (HYCODAN) 5-1.5 MG/5ML syrup Take 5 mLs by mouth every 4 (four) hours as needed for cough.  240 mL  4  . prochlorperazine (COMPAZINE) 10 MG tablet Take 1 tablet (10 mg total) by mouth every 6 (six) hours as needed.  30 tablet  6  . DISCONTD: Dextromethorphan-Guaifenesin  (ROBITUSSIN DM) 10-100 MG/5ML liquid Take 5 mLs by mouth every 4 (four) hours as needed.  480 mL  3  . DISCONTD: mirtazapine (REMERON) 30 MG tablet Take 1 tablet (30 mg total) by mouth at bedtime.  30 tablet  5  . DISCONTD: moxifloxacin (AVELOX) 400 MG tablet Take 1 tablet (400 mg total) by mouth daily.  14 tablet  0  . DISCONTD: predniSONE (DELTASONE) 10 MG tablet Take 4 tablets/day x 3 days, 3 tablets/day x 3 days, 2 tablets/day x 3 days, 1 tablet/day x 3 days then stop-hospice  30 tablet  0  . DISCONTD: predniSONE (STERAPRED UNI-PAK) 5 MG TABS As directed, 6 day pak  21 tablet  0

## 2012-03-10 NOTE — Telephone Encounter (Signed)
Per [name], call Brooke [name], give order to place a Foley catheter.  Also, when Brooke [name] calls back, [name] would like to speak with her directly.  [name] spoke with Brooke [name].  I also spoke with Brooke [name].  She is aware to place Foley per PW.  Also, aware of following orders per today's visit:  Patient Instructions     A Foley catheter will be ordered  Use Dymista one puff twice daily each nostril  Start Avelox daily for 14days plus hycodan for cough suppression  Stop flonase  Obtain lab and urine culture today  No other medication changes  I will follow you by phone with the hospice RN    Brooke [name] is requesting Avelox be sent to [facility] instead of [facility] as [facility] already has pt's hospice information.  She is aware avelox rx will be sent to [facility] and pt have hycodan rx with her.  She verbalized understanding of orders from today's visit and voiced no further questions/concerns at this time.  Note:  Avelox rx was cancelled with Romeo Apple at [facility] and sent to [facility].

## 2012-03-11 NOTE — Assessment & Plan Note (Signed)
Severe endstage bronchiectasis with chronic respiratory failure   Plan A Foley catheter will be ordered Use Dymista one puff twice daily each nostril Start Avelox daily for 14days plus hycodan for cough suppression Stop flonase No other medication changes

## 2012-03-11 NOTE — Progress Notes (Signed)
Quick Note:  Call pt and tell her labs show high white count. Urine culture is pending, No change in medications ______

## 2012-03-12 LAB — URINE CULTURE

## 2012-03-14 ENCOUNTER — Ambulatory Visit: Payer: Medicare Other | Admitting: Critical Care Medicine

## 2012-03-14 ENCOUNTER — Other Ambulatory Visit: Payer: Self-pay | Admitting: Critical Care Medicine

## 2012-03-14 ENCOUNTER — Telehealth: Payer: Self-pay | Admitting: Critical Care Medicine

## 2012-03-14 MED ORDER — NITROFURANTOIN MONOHYD MACRO 100 MG PO CAPS: 100.0000 mg | ORAL_CAPSULE | Freq: Two times a day (BID) | ORAL | Status: DC

## 2012-03-14 NOTE — Telephone Encounter (Signed)
Notes Recorded by Storm Frisk, MD on 03/11/2012 at 3:21 PM Call pt and tell her labs show high white count. Urine culture is pending, No change in medications  I spoke with patient about results and she verbalized understanding and had no questions. Pt did want a copy of this sent to her hospice nurse. I called Jaymes Graff and was giving # to fax this to [phone]. I have done so. Nothing further was needed

## 2012-03-14 NOTE — Progress Notes (Signed)
Quick Note:  lmomtcb ______ 

## 2012-03-14 NOTE — Progress Notes (Signed)
Quick Note:  Called, spoke with pt. Informed her urine culture is positive for UTI. She is aware macrobid 100 mg bid x 7 days sent to [facility]. She verbalized understanding of results and recs. Nothing further needed at this time. ______

## 2012-03-15 ENCOUNTER — Telehealth: Payer: Self-pay | Admitting: Critical Care Medicine

## 2012-03-15 NOTE — Telephone Encounter (Signed)
Noted on catheter Ok to use prilosec daily 20mg  daily

## 2012-03-15 NOTE — Telephone Encounter (Signed)
I spoke with [name], hospice nurse, and she states she wanted to let [name] know that the pt is refusing to have a catheter placed. Also the pt is c/o having a lot of heartburn and indigestion and [name] wants to know if it is ok to give the pt Prilosec OTC or is there something else [name] would recommend. Please advise. Carron Curie, CMA Allergies  Allergen Reactions  . Ambien (Zolpidem Tartrate)     Sleep walking and altered mental status  . Doxycycline     vomiting  . Levofloxacin Other (See Comments)    Unknown rxn.  Tolerates Cipro  . Mometasone Furoate     REACTION: nasonex causes swollen throat, thrush  . Penicillins   . Sulfonamide Derivatives

## 2012-03-15 NOTE — Telephone Encounter (Signed)
I spoke with Brooke [name] and is aware of PW recs. She voiced her understanding and needed nothing further

## 2012-03-21 ENCOUNTER — Telehealth: Payer: Self-pay | Admitting: Critical Care Medicine

## 2012-03-21 MED ORDER — METRONIDAZOLE 250 MG PO TABS: 250.0000 mg | ORAL_TABLET | Freq: Three times a day (TID) | ORAL | Status: DC

## 2012-03-21 NOTE — Telephone Encounter (Signed)
rx sent. Brooke [name] is aware.Carron Curie, CMA

## 2012-03-21 NOTE — Telephone Encounter (Signed)
rx flagyl 250mg  tid x 7days

## 2012-03-21 NOTE — Telephone Encounter (Signed)
I spoke with Cordelia Pen, hospice nurse,and she states the pt is on Macrobid (last dose today) and also Avelox (last dose 17th). Pt has developed diarrhea and is requesting flagyl prescription. Pt states she always gets diarrhea when she takes a lot of abx and flagyl always helps. Please advise.Carron Curie, CMA Allergies  Allergen Reactions  . Ambien (Zolpidem Tartrate)     Sleep walking and altered mental status  . Doxycycline     vomiting  . Levofloxacin Other (See Comments)    Unknown rxn.  Tolerates Cipro  . Mometasone Furoate     REACTION: nasonex causes swollen throat, thrush  . Penicillins   . Sulfonamide Derivatives

## 2012-03-23 ENCOUNTER — Telehealth: Payer: Self-pay | Admitting: Critical Care Medicine

## 2012-03-23 NOTE — Telephone Encounter (Signed)
Can insert flexiseal tube if hospice able to do this? Or use depends diapers.

## 2012-03-23 NOTE — Telephone Encounter (Signed)
Dup. Msg.me

## 2012-03-23 NOTE — Telephone Encounter (Signed)
I spoke with Cordelia Pen and she states she will discuss this with pt and see what she decides. Nothing further was needed

## 2012-03-23 NOTE — Telephone Encounter (Signed)
Called spoke with Sherri who stated that pt is done with the macrobid, and will finish the avelox tomorrow.  Began the Flagyl (as discussed on Monday 10.14.13) Monday evening.  Is taking imodium "around the clock" as directed.  Recs so far are not helping the diarrhea.  Sherri stated that she informed pt that there "is not much else that can be done" for the diarrhea, but pt requests PW's recs regardless.  Dr Delford Field please advise, thanks.

## 2012-03-25 ENCOUNTER — Other Ambulatory Visit: Payer: Self-pay | Admitting: Pulmonary Disease

## 2012-03-25 MED ORDER — COLCHICINE 0.6 MG PO TABS: 0.6000 mg | ORAL_TABLET | Freq: Two times a day (BID) | ORAL | Status: DC

## 2012-03-29 ENCOUNTER — Telehealth: Payer: Self-pay | Admitting: Critical Care Medicine

## 2012-03-29 MED ORDER — PROCHLORPERAZINE MALEATE 10 MG PO TABS: 10.0000 mg | ORAL_TABLET | Freq: Four times a day (QID) | ORAL | Status: DC | PRN

## 2012-03-29 MED ORDER — HYDROCODONE-ACETAMINOPHEN 10-325 MG PO TABS: ORAL_TABLET | ORAL | Status: DC

## 2012-03-29 MED ORDER — METRONIDAZOLE 250 MG PO TABS: 250.0000 mg | ORAL_TABLET | Freq: Three times a day (TID) | ORAL | Status: DC

## 2012-03-29 NOTE — Telephone Encounter (Signed)
Per SN----ok to give the refill of the vicodin and the flagyl  This time only.  thanks

## 2012-03-29 NOTE — Telephone Encounter (Signed)
Spoke with Hospice nurse Pt requesting refill on hydrocodone-will run out tonight Also still having loose stools and wants refill on flagyl- she just finished 10 days of flagyl and wants to know if MD recs more, or maybe the pt could try drinking kefir.  Please advise thanks! Allergies  Allergen Reactions  . Ambien (Zolpidem Tartrate)     Sleep walking and altered mental status  . Doxycycline     vomiting  . Levofloxacin Other (See Comments)    Unknown rxn.  Tolerates Cipro  . Mometasone Furoate     REACTION: nasonex causes swollen throat, thrush  . Penicillins   . Sulfonamide Derivatives

## 2012-03-29 NOTE — Telephone Encounter (Signed)
[name] of hospice called back & requests a refill for hydrocodone of 100 to last the pt 20 days.  Jaymes Graff can be reached at [phone].  [name]

## 2012-03-29 NOTE — Telephone Encounter (Signed)
Rxs requested were refilled Carley Hammed aware

## 2012-03-29 NOTE — Telephone Encounter (Signed)
LMTCB

## 2012-04-05 ENCOUNTER — Telehealth: Payer: Self-pay | Admitting: Critical Care Medicine

## 2012-04-05 NOTE — Telephone Encounter (Signed)
Called, spoke with Brooke [name] with Hospice.  States Brooke [name] cont to have diarrhea mainly qhs.  Brooke [name] describes stool as grainy and slimy with a foul odor.  Per Brooke [name], Brooke [name] has lost 2 lbs over the past 4 wks.  Current wt approx 92lbs.  Brooke [name] states Brooke [name] wasn't taking Lanetta Inch like she was supposed to.  Brooke [name] has asked her to take this daily.  Also, states Brooke [name]'s mood has also been down and she's not feeling well.    Per Brooke [name], Brooke [name] states she just felt so much better when on the "prednisone burst" by [name] and would like to know if prednisone would help with the diarrhea or any further recs [name] may have.  Dr.Wright, pls advise.  Thank you.

## 2012-04-05 NOTE — Telephone Encounter (Signed)
Brooke [name] is aware of Dr. Lynelle Doctor recommendations.

## 2012-04-05 NOTE — Telephone Encounter (Signed)
Prednisone will not help the diarrhea. Take questran daily Continue to provide comfort measures

## 2012-04-07 ENCOUNTER — Telehealth: Payer: Self-pay | Admitting: Critical Care Medicine

## 2012-04-07 NOTE — Telephone Encounter (Signed)
Per Wilnette Kales, nurse manager for hospice, the pt has been very weak and falling at times. They would like to know if some of her medications can be stopped or held to see if this improves. Out of the listed medications, which could be stopped or held:  Loperimide, Questran or Immodium...phenergan and zofran.Marland KitchenMarland KitchenHydromet, Robitussin and Delsym. Thelma aware PW is no longer in the office this afternoon and we will have to forward this to our doc of the day, Dr. Maple Hudson for recs. If calling back after 5pm we will need to call [phone] to speak with the nurse on call.

## 2012-04-07 NOTE — Telephone Encounter (Signed)
Per Dr. Maple Hudson, try pt off of Questran, Loperamide, Robitussin and Delsym to see if her sxs improve.  I spoke with Thelma at hospice and she is aware of the above recommendations. They will call if they have any other questions or concerns.   I will forward msg to PW so he is aware of these changes.

## 2012-04-08 ENCOUNTER — Telehealth: Payer: Self-pay | Admitting: Critical Care Medicine

## 2012-04-08 NOTE — Telephone Encounter (Signed)
French Ana has been notified of all medications that were being stopped to see if this will help with pt's current diarrhea. French Ana also states that the pt is still refusing placement to [facility] and says she is not ready to give up just yet. French Ana will call if she has any further questions or concerns regarding the pt.

## 2012-04-08 NOTE — Telephone Encounter (Signed)
LMTCB for Dimas Millin at [phone]  [phone].

## 2012-04-08 NOTE — Telephone Encounter (Signed)
See phone note from 04/07/12.

## 2012-04-08 NOTE — Telephone Encounter (Signed)
French Ana aware and will speak with the pt regarding [facility] again and let us know what the pt wants to do.

## 2012-04-08 NOTE — Telephone Encounter (Signed)
I agree with this recommendation  Is it time for this pt to be admitted to [facility]???

## 2012-04-08 NOTE — Telephone Encounter (Signed)
Noted.   Again, I recommend [facility] transfer

## 2012-04-11 ENCOUNTER — Telehealth: Payer: Self-pay | Admitting: Critical Care Medicine

## 2012-04-11 MED ORDER — ALPRAZOLAM 0.5 MG PO TABS: 0.5000 mg | ORAL_TABLET | Freq: Three times a day (TID) | ORAL | Status: DC | PRN

## 2012-04-11 MED ORDER — HYDROCODONE-HOMATROPINE 5-1.5 MG/5ML PO SYRP: 5.0000 mL | ORAL_SOLUTION | ORAL | Status: DC | PRN

## 2012-04-11 MED ORDER — TRAZODONE HCL 100 MG PO TABS: 100.0000 mg | ORAL_TABLET | Freq: Every day | ORAL | Status: DC

## 2012-04-11 NOTE — Telephone Encounter (Signed)
LMTCB

## 2012-04-11 NOTE — Telephone Encounter (Signed)
This is ok with me for refills on meds as outlined Tell Jaymes Graff thanks to dr Gibson Ramp for his help in this patient

## 2012-04-11 NOTE — Telephone Encounter (Signed)
I spoke with Brooke [name] and she stated dr. Anne Fu made home visit to pt today. She was c/o outrageous diarrhea. Dr. Anne Fu ordered Cdiff/culture on stool. He also put hold on pt Colchicine since she is not having any gout pain. Also Brooke [name] stated pt needs refills on Trazodone (don't see where this has been refilled by [name]), alprazolam (last refilled 01/13/12 #90 x 1 refill), and hycodan cough syrup (last refilled 03/10/12 #240 x 4 refills). Per Brooke [name] pt needs these called in today to [facility] pharmacy. Please advise if okay to refill [name] thanks

## 2012-04-11 NOTE — Telephone Encounter (Signed)
Jaymes Graff called back again. Leanora Ivanoff

## 2012-04-11 NOTE — Telephone Encounter (Signed)
LMOM for Jaymes Graff advising that meds were refilled and a thanks to Dr. Gibson Ramp per PW.

## 2012-04-18 ENCOUNTER — Telehealth: Payer: Self-pay | Admitting: Critical Care Medicine

## 2012-04-18 NOTE — Telephone Encounter (Signed)
Stop colchicine

## 2012-04-18 NOTE — Telephone Encounter (Signed)
Left detailed msg on Brooke [name]'s VM advising to stop colchicine per PW and asked she pls call back with any further questions.

## 2012-04-18 NOTE — Telephone Encounter (Signed)
Called, spoke with [place].  States the stool samples from last week were all negative.  States pt help colchicine on her on -- diarrhea is now better.  Other antidiarrheals are also on hold.  Would like to know if they should cont to hold the colchicine.  [name], pls advise.  Thank you.    ** Pls leave detailed msg on [name]'s VM if no answwer when calling back.

## 2012-04-27 ENCOUNTER — Telehealth: Payer: Self-pay | Admitting: Critical Care Medicine

## 2012-04-27 ENCOUNTER — Other Ambulatory Visit: Payer: Self-pay | Admitting: Pulmonary Disease

## 2012-04-27 NOTE — Telephone Encounter (Signed)
Hospice is requesting an order to D/C the medications that were being held to see if this would help with the diarrhea pt was having. Okay to D/C Loperamide, Questran, Delsym and Robitussin? Pls advise.

## 2012-04-27 NOTE — Telephone Encounter (Signed)
LMOMTCB x 1 for Brooke [name].

## 2012-04-27 NOTE — Telephone Encounter (Signed)
L,momtcb x1 for terese

## 2012-04-27 NOTE — Telephone Encounter (Signed)
What is your ????

## 2012-04-27 NOTE — Telephone Encounter (Signed)
Called spoke with [name], pt is off the colchicine.  But per the 10.31.13 phone note, CY ordered to place the Questran, Loperamide, Robitussin and Delsym on hold...  Michel Bickers, [facility] 04/07/2012 4:46 PM Signed  Per Dr. Maple Hudson, try pt off of Questran, Loperamide, Robitussin and Delsym to see if her sxs improve.  I spoke with Thelma at hospice and she is aware of the above recommendations. They will call if they have any other questions or concerns.  I will forward msg to PW so he is aware of these changes.  Per [name], she is requesting an order to go ahead and d/c these meds as pt is not currently taking them.  Dr Delford Field please advise, thanks.

## 2012-04-27 NOTE — Telephone Encounter (Signed)
Yes this is ok 

## 2012-04-28 ENCOUNTER — Telehealth: Payer: Self-pay | Admitting: Critical Care Medicine

## 2012-04-28 MED ORDER — CEFUROXIME AXETIL 500 MG PO TABS: 500.0000 mg | ORAL_TABLET | Freq: Two times a day (BID) | ORAL | Status: DC

## 2012-04-28 MED ORDER — CITALOPRAM HYDROBROMIDE 20 MG PO TABS: 20.0000 mg | ORAL_TABLET | Freq: Every day | ORAL | Status: DC

## 2012-04-28 MED ORDER — HYDROCODONE-ACETAMINOPHEN 10-325 MG PO TABS: ORAL_TABLET | ORAL | Status: DC

## 2012-04-28 MED ORDER — HYDROCODONE-HOMATROPINE 5-1.5 MG/5ML PO SYRP: 5.0000 mL | ORAL_SOLUTION | ORAL | Status: DC | PRN

## 2012-04-28 NOTE — Telephone Encounter (Signed)
Called and spoke with terese and she is aware that PW recs were to leave the citalopram at the current dose.  She stated that the pharmacy had questions about the ceftin.  i called and spoke with the pharmacy and they only had questions since hospice did not want to cover the cost of this medication. The pharmacy will get this worked out with hospice.  Nothing further is needed

## 2012-04-28 NOTE — Telephone Encounter (Signed)
Rx:  Ceftin 500mg  bid x 7days (generic please) Ok to increase citalopram 20mg  daily Ok to refill hydrocondone/apap and hycodan syrup

## 2012-04-28 NOTE — Telephone Encounter (Signed)
Jaymes Graff called and states the pt refuses to take ceftin without flagyl because it gives her diarrhea.//[facility].Brooke [name]

## 2012-04-28 NOTE — Telephone Encounter (Signed)
There is no higher dose on citalopram  Leave it at current dose

## 2012-04-28 NOTE — Telephone Encounter (Signed)
I spoke with Brooke [name] and advised of recs. Brooke [name] states there was a mis- communication. The pt is currently on citalopram 20mg  and she wanted to know if it could be increased to a higher dose. Please advise if ok to increase citalopram higher then 20mg  daily. Rx for ceftin sent to pharmacy.  Thanks. Carron Curie, CMA

## 2012-04-28 NOTE — Telephone Encounter (Signed)
I spoke with the pharmacy to call in medications and they state there is a cross reaction with ceftin and penicillin. Pt is allergic to penicillin. Please advise if still ok to send in rx? Carron Curie, CMA Allergies  Allergen Reactions  . Ambien (Zolpidem Tartrate)     Sleep walking and altered mental status  . Doxycycline     vomiting  . Levofloxacin Other (See Comments)    Unknown rxn.  Tolerates Cipro  . Mometasone Furoate     REACTION: nasonex causes swollen throat, thrush  . Penicillins   . Sulfonamide Derivatives

## 2012-04-28 NOTE — Telephone Encounter (Signed)
Error.Brooke [name] ° °

## 2012-04-28 NOTE — Telephone Encounter (Signed)
Yes it is ok

## 2012-04-28 NOTE — Telephone Encounter (Signed)
LMTCBx1 for terese. Medications called into pharmacy. Carron Curie, CMA

## 2012-04-28 NOTE — Telephone Encounter (Signed)
Message already forwarded to PW to address.  Will close this message.

## 2012-04-28 NOTE — Telephone Encounter (Signed)
Brooke [name] aware per PW--Ok to D/C Loperamide, Questran, Delsym and Robitussin  Brooke [name] had a few treatment requests:  1) Refill Hydrocodone-acet 10-325  Take 1-2 tablets by mouth every 4 hours as needed  #100 (patient taking 5-6 tabs per day-every 4 hours) 2) Refill Hycodan Syrup 5-5.1mg /61ml  Take 5ml every 4 hours prn cough.  #216ml                       Needs to be sent to [facility]  3) Brooke [name] requests increase of Citalopram 20mg  or change to a different type. Patient is expressing increased depressive symptoms.   4) Pt is having increased cough with yellow/green thick sputum, chest congestion, hoarseness and sob with cough. Daughter is requesting an abx for this.  Please advise Dr Delford Field. Thanks.

## 2012-04-29 ENCOUNTER — Telehealth: Payer: Self-pay | Admitting: Critical Care Medicine

## 2012-04-29 NOTE — Telephone Encounter (Signed)
This is ok

## 2012-04-29 NOTE — Telephone Encounter (Signed)
Called and spoke with terese and she is aware of PW recs for the flagyl and she will just reorder what the pt had before. Nothing further is needed.

## 2012-04-29 NOTE — Telephone Encounter (Signed)
PW--please advise if ok to give verbal order for pt to use flagyl---pt already has the rx for this.  Please advise. Thanks  Allergies  Allergen Reactions  . Ambien (Zolpidem Tartrate)     Sleep walking and altered mental status  . Doxycycline     vomiting  . Levofloxacin Other (See Comments)    Unknown rxn.  Tolerates Cipro  . Mometasone Furoate     REACTION: nasonex causes swollen throat, thrush  . Penicillins   . Sulfonamide Derivatives

## 2012-05-06 ENCOUNTER — Other Ambulatory Visit: Payer: Self-pay | Admitting: Critical Care Medicine

## 2012-05-06 NOTE — Telephone Encounter (Signed)
Needed refills on hycodan and xanax I oked this by phone

## 2012-05-09 ENCOUNTER — Telehealth: Payer: Self-pay | Admitting: Critical Care Medicine

## 2012-05-09 MED ORDER — CITALOPRAM HYDROBROMIDE 40 MG PO TABS: 40.0000 mg | ORAL_TABLET | Freq: Every day | ORAL | Status: DC

## 2012-05-09 MED ORDER — HYDROCODONE-ACETAMINOPHEN 10-325 MG PO TABS: ORAL_TABLET | ORAL | Status: DC

## 2012-05-09 NOTE — Telephone Encounter (Signed)
Per Dr Delford Field, 4 refills okay for Norco.  Spoke with Cordelia Pen, aware that medications are being called into Pharm as requested.   Citalopram 40mg  Take 1 tablet qd  #30 x 6 refills Norco 10-325 Take 1-2 tablets by mouth every 4 hours as needed. #180  X 4 refills.   When called into pharm, pharmacy made aware to change Citalopram in their system to 40mg  and remove the 20mg .

## 2012-05-09 NOTE — Telephone Encounter (Signed)
Spoke with [facility] with Hospice.Marland KitchenMarland KitchenPatient needs refill of Norco 10-325  Take 1-2 tablets by mouth every 4 hours prn. Rx normally filled with #100, but Cordelia Pen is asking for #180 with a few refills so they do not have to call so frequent. Patient is taking right at 6 tablets every day.   Patient also needs refills of Citalopram. Patient was taking 20mg  qd and increased herself to 40mg  due to increased depression.  Hospice requesting new Rx written for 40mg  qd.   Is it okay to fill these Rx this way? Increase Citalopram to 40mg  qd and Norco to #180?  Dr Delford Field please advise. Thanks.

## 2012-05-09 NOTE — Telephone Encounter (Signed)
How many refills on the Norco would you like me to give her?

## 2012-05-09 NOTE — Telephone Encounter (Signed)
Yes these refills and new doses/amounts are fine

## 2012-05-11 ENCOUNTER — Other Ambulatory Visit: Payer: Self-pay | Admitting: Critical Care Medicine

## 2012-05-11 NOTE — Telephone Encounter (Signed)
Per pt's chart, norco 10-325mg  1-2 q4h prn and the requested increase of celexa from 20mg  to 40mg  were telephoned to [facility] as requested by [facility] with Hospice on 12.2.13 by Ashtyn.  Called [facility], spoke with pharmacist Loistine Chance who verified this.  Called [name], left detailed message on named voice mail informing her of the above.  Advised [name] to please call if anything further is needed; will sign off.

## 2012-05-12 ENCOUNTER — Telehealth: Payer: Self-pay | Admitting: Critical Care Medicine

## 2012-05-12 NOTE — Telephone Encounter (Signed)
According to records we sent rx for hycodan on 04-28-12 #233ml x 4 refills but terese states she called and they did not have this on file. I called and advised pharmacy hycodan should have 4 refills. Nothing further needed.Carron Curie, CMA

## 2012-05-18 ENCOUNTER — Telehealth: Payer: Self-pay | Admitting: Critical Care Medicine

## 2012-05-18 MED ORDER — TRIAMTERENE-HCTZ 75-50 MG PO TABS: 1.0000 | ORAL_TABLET | Freq: Every day | ORAL | Status: DC

## 2012-05-18 MED ORDER — ALPRAZOLAM 0.5 MG PO TABS: 0.5000 mg | ORAL_TABLET | Freq: Three times a day (TID) | ORAL | Status: DC | PRN

## 2012-05-18 MED ORDER — TRAZODONE HCL 100 MG PO TABS: 100.0000 mg | ORAL_TABLET | Freq: Every day | ORAL | Status: DC

## 2012-05-18 NOTE — Telephone Encounter (Signed)
Prescriptions have been sent/called into her pharmacy. Jaymes Graff is aware of this.

## 2012-05-18 NOTE — Telephone Encounter (Signed)
[name] called back.  Call her @ [phone]. Brooke [name]

## 2012-05-18 NOTE — Telephone Encounter (Signed)
[facility] 

## 2012-05-18 NOTE — Telephone Encounter (Signed)
I spoke with terese and she states the pt has a few days ft of all meds so ok to wait until tomorrow for PW to address. Please advise if ok to refill the following:   Trazodone-last fill on 04-11-12 #30 Alprazolam-last fill on 04-11-12 #90 Trimterene-last fill on  11-24-2011 Thanks. Carron Curie, CMA

## 2012-05-18 NOTE — Telephone Encounter (Signed)
Ok to refill 

## 2012-05-20 ENCOUNTER — Other Ambulatory Visit: Payer: Self-pay | Admitting: Critical Care Medicine

## 2012-05-20 MED ORDER — ALUM & MAG HYDROXIDE-SIMETH 500-450-40 MG/5ML PO SUSP: 5.0000 mL | ORAL | Status: DC | PRN

## 2012-05-20 NOTE — Telephone Encounter (Signed)
rx for mylanta sent

## 2012-05-25 ENCOUNTER — Telehealth: Payer: Self-pay | Admitting: Critical Care Medicine

## 2012-05-25 MED ORDER — TRAZODONE HCL 150 MG PO TABS: 150.0000 mg | ORAL_TABLET | Freq: Every day | ORAL | Status: DC

## 2012-05-25 MED ORDER — ALBUTEROL SULFATE (2.5 MG/3ML) 0.083% IN NEBU: 2.5000 mg | INHALATION_SOLUTION | Freq: Four times a day (QID) | RESPIRATORY_TRACT | Status: DC | PRN

## 2012-05-25 NOTE — Telephone Encounter (Signed)
RX'S have been sent to the pharmacy. terese is aware and needed nothing further

## 2012-05-25 NOTE — Telephone Encounter (Signed)
[facility] pharmacy is who the pt uses

## 2012-05-25 NOTE — Telephone Encounter (Signed)
lmtcb x1 for Brooke [name].  

## 2012-05-25 NOTE — Telephone Encounter (Signed)
Ok on the trazadone increase Ok for the albuterol refill

## 2012-05-25 NOTE — Telephone Encounter (Signed)
I spoke with Jaymes Graff. Pt is needing refill on Albuterol neb 2.5 ML/3ML she can take every 6 hrs PRN. This was giving to her back when she was in the hospital in June. I do not see this on her current med list. Please advise if okay to refill?  Also, per Jaymes Graff pt was not able to pick up her trazodone last week bc insurance would not pay for this d/t it was early refill. Jaymes Graff reports pt told her the reason she ran out early bc she was taking 1 1/2 tablets instead of 1 tablet at bedtime but she had told the nurse on call this weekend that she would take 2 tablets at bedtime at times (trazodone 200 mg). Dr. Theron Arista called in for pt Restoril 15 mg 1 at bedtime since pt could not get the trazodone. Pt reports this does not help at all. Pt may be able to get the trazodone refilled this week but Jaymes Graff needs to know if [name] approves pt increasing trazodone to 150 mg. If so then a new RX will need to be called. Please advise [name] thanks     --per terese she will be in a meeting so okay to leave a detailed message on her VM bc it is a private/secure VM

## 2012-05-31 ENCOUNTER — Telehealth: Payer: Self-pay | Admitting: Critical Care Medicine

## 2012-05-31 NOTE — Telephone Encounter (Signed)
I spoke with Jaymes Graff and she wanted to discuss the pt hydrocodone. She states the pt is doubling up on the medication. She states the pt is taking 2 tablets every 4 hours as needed. I advised this is the directions that we have on the pt med list so she can take up to 2 tabs every 4 hours as needed. Jaymes Graff has corrected this on there med list. She states the pt told her she will run out over christmas and wants a refill sent [facility] because [facility]. Pt was given 4 refills on last RX on 05-09-12 that was sent to [facility] but she cannot get refill because they are closed so I have [facility]. Jaymes Graff is aware. Carron Curie, CMA

## 2012-06-10 ENCOUNTER — Telehealth: Payer: Self-pay | Admitting: Critical Care Medicine

## 2012-06-10 MED ORDER — METRONIDAZOLE 500 MG PO TABS: 500.0000 mg | ORAL_TABLET | Freq: Three times a day (TID) | ORAL | Status: DC

## 2012-06-10 MED ORDER — AZITHROMYCIN 250 MG PO TABS: ORAL_TABLET | ORAL | Status: DC

## 2012-06-10 NOTE — Telephone Encounter (Signed)
Ok for zpak Flagyl will not prevent  Diarrhea but OK to order if she insists- 500 tid x 3ds STOP celexa while on zpak

## 2012-06-10 NOTE — Telephone Encounter (Signed)
Brooke [name] returned call.  She is requesting rx's be sent to [facility] and to call her back with details about rx and dosage so that she may get the rx's paid for by Hospice.  If she does not answer her phone please leave a detailed message.  Her # is [phone]. Brooke [name]

## 2012-06-10 NOTE — Telephone Encounter (Signed)
Called, spoke with [place].  Informed her of below per Dr. Vassie Loll.  [name] verbalized understanding but states pt has tried probiotic and yogurt in the past with the abxs.  States pt gets "horrible diarrhea" with any abx, and states pt will not take an abx unless she does have a rx for Flaygl.  Dr. Vassie Loll, pls advise.    Also, when trying to send rx for zpak received 2 override warnings: 1.  Prolonged qt interval between zpak and avelox -- verified with Jaymes Graff that pt is NOT on avelox at this time. 2.  Prolonges qt interval between zpak and celexa.  Looked through pt's med list.  I do not see where she has recently had a zpak.  Dr. Vassie Loll, pls advise if you are ok with proceeding with this.  Thank you.

## 2012-06-10 NOTE — Telephone Encounter (Signed)
zpak Does not need flagyl Take probiotic, yogurt etc

## 2012-06-10 NOTE — Telephone Encounter (Signed)
Called, spoke with Jaymes Graff, pt's hospice nurse.  States her and Dr. Jamie Brookes visited pt today for a recert visit.  Pt's sputum is changing to a dark yellow with green mucus and is thick.  Also, has increased pain on the right side of her chest - mostly where the congestion is, and has a coarse, moist, congested cough.  No fever.  States o2 sat was 86-87% on RA and increased to 96% on 2lpm.  Lungs with coarse crackles and rhonchi.  No wheezing.  Requesting abx and Flagyl with the abx as pt gets "terrible diarhea" when on abx.  As PW is off, will route to doc of the day.  Dr. Vassie Loll, pls advise.  Thank you.  [facility]  Allergies  Allergen Reactions  . Ambien (Zolpidem Tartrate)     Sleep walking and altered mental status  . Doxycycline     vomiting  . Levofloxacin Other (See Comments)    Unknown rxn.  Tolerates Cipro  . Mometasone Furoate     REACTION: nasonex causes swollen throat, thrush  . Penicillins   . Sulfonamide Derivatives

## 2012-06-10 NOTE — Telephone Encounter (Signed)
lmomtcb to inform Jaymes Graff of below per RA.  Rxs have been sent to [facility].

## 2012-06-10 NOTE — Telephone Encounter (Signed)
Left detailed msg on [name]'s named VM per her request stating below per Dr. Vassie Loll.  Advised both, the zpak and Flagyl rx, were sent to [facility] and pt is to stop the celexa while on zpak.  Advised if she has any further questions or concerns, pls call back.  Will sign off.

## 2012-06-15 ENCOUNTER — Telehealth: Payer: Self-pay | Admitting: Critical Care Medicine

## 2012-06-15 NOTE — Telephone Encounter (Signed)
therese is going to call back. Will await her call back

## 2012-06-16 ENCOUNTER — Telehealth: Payer: Self-pay | Admitting: Critical Care Medicine

## 2012-06-16 ENCOUNTER — Telehealth: Payer: Self-pay | Admitting: *Deleted

## 2012-06-16 NOTE — Telephone Encounter (Signed)
I spoke with Brooke [name] and she states the pt thought she needed refills on some meds but they called the pharmacy and they have refills so nothing further needed. Carron Curie, CMA

## 2012-06-16 NOTE — Telephone Encounter (Signed)
Per CY: Skik triamterene and hctz through the weekend then take every other day

## 2012-06-16 NOTE — Telephone Encounter (Signed)
Called, spoke with [facility] with Hospice. Informed her of below per Dr. Maple Hudson.  She verbalized understanding and voiced no further questions or concerns at this time.  Med list updated to reflect this change.

## 2012-06-16 NOTE — Telephone Encounter (Signed)
Previous message accidentally closed.     Call Documentation     Lazarus Salines, RN 06/16/2012 5:23 PM Signed  Per Zack Seal: Skik triamterene and hctz through the weekend then take every other day Gweneth Dimitri, RN 06/16/2012 4:54 PM Signed  Jeanene Erb, spoke with [facility] with hospice. States pt has been very sleepy and lethargic today. States pt's daughter took pt's BP with a mechanical cuff while setting and got a reading of 72/44 with a pulse of 58-61. Daughter called Jaymes Graff to come visit. Jaymes Graff states she got a reading of 100/60-62 while pt was laying and sitting. Jaymes Graff states she visited pt yesterday and BP was the same. Jaymes Graff also states pt is no feeling well today. She has concerns regarding the triamterene hctz. States pt is currently taking triamterene hctz 75/50 1 whole tablet qd. Would like to know if this should be decreased or if pt should be taking this at all. As [name] is off, will route to doc of the day. Dr. Maple Hudson, pls advise. Thank you.

## 2012-06-16 NOTE — Telephone Encounter (Signed)
Called, spoke with [facility] with hospice.  States pt has been very sleepy and lethargic today.  States pt's daughter took pt's BP with a mechanical cuff while setting and got a reading of 72/44 with a pulse of 58-61.  Daughter called Jaymes Graff to come visit.  Jaymes Graff states she got a reading of 100/60-62 while pt was laying and sitting.  Jaymes Graff states she visited pt yesterday and BP was the same.  Jaymes Graff also states pt is no feeling well today.  She has concerns regarding the triamterene hctz.  States pt is currently taking triamterene hctz 75/50 1 whole tablet qd.  Would like to know if this should be decreased or if pt should be taking this at all.  As [name] is off, will route to doc of the day.  Dr. Maple Hudson, pls advise.  Thank you.

## 2012-06-17 NOTE — Telephone Encounter (Signed)
Stop trimeterene completely

## 2012-06-20 ENCOUNTER — Telehealth: Payer: Self-pay | Admitting: Critical Care Medicine

## 2012-06-20 NOTE — Telephone Encounter (Addendum)
Called, spoke with Jaymes Graff, pt's hospice nurse.  Informed her PW would like pt to stop trimeterene completely.  [name] verbalized understanding of this, thinks it "would be a great idea," and will inform pt.  Nothing further needed at this time.  Jaymes Graff will call back if anything further is needed.

## 2012-06-20 NOTE — Telephone Encounter (Signed)
lmomtcb x1 for Brooke [name]

## 2012-06-21 NOTE — Telephone Encounter (Signed)
[name] returned call.  She is very sorry to keep missing our calls. Brooke [name]

## 2012-06-21 NOTE — Telephone Encounter (Signed)
lmtcb x1 for terese.  

## 2012-06-21 NOTE — Telephone Encounter (Signed)
Brooke [name] returned triage's call & asked to be reached at [phone].  [name]

## 2012-06-21 NOTE — Telephone Encounter (Signed)
lmomtcb x1 for terese 

## 2012-06-21 NOTE — Telephone Encounter (Signed)
LMTCB x 1 

## 2012-06-21 NOTE — Telephone Encounter (Signed)
Brooke [name] calling to let [name] know that the pt began having swelling in her right foot, ankle and 4 inches up the calf on Monday, 06/20/12. This was after the pt had no Triamterene over the w/e or on 06/20/12. The pt did go ahead and take one pill today and wants to know if this should be restarted, maybe every other day. The pt also has drooping of the right eyelid that started  On 06/20/12 and is affecting her peripheral vision only and there is slight pain associated with eye movement. Pt's central vision has not been affected. Pt was going to see if she could get in with her eye doctor today or tomorrow about this. Brooke [name] said that we can give her a callback on Wed., 06/22/12 after [name] has reviewed this msg and gives recs. She does not feel this needs to go to another physician. PW, pls advise. Allergies  Allergen Reactions  . Ambien (Zolpidem Tartrate)     Sleep walking and altered mental status  . Doxycycline     vomiting  . Levofloxacin Other (See Comments)    Unknown rxn.  Tolerates Cipro  . Mometasone Furoate     REACTION: nasonex causes swollen throat, thrush  . Penicillins   . Sulfonamide Derivatives

## 2012-06-21 NOTE — Telephone Encounter (Signed)
Ok for qod diuretic   i have no recs for eye droop

## 2012-06-21 NOTE — Telephone Encounter (Signed)
Brooke [name] called back.  We had the wrong # for her.  Please call her back @ [phone]. Brooke [name]

## 2012-06-22 NOTE — Telephone Encounter (Signed)
I spoke with terese. Aware okay for every other day diuretic. No recs for eye drops. terese voiced  Her understanding and needed nothing further

## 2012-06-23 ENCOUNTER — Telehealth: Payer: Self-pay | Admitting: Critical Care Medicine

## 2012-06-23 MED ORDER — MOXIFLOXACIN HCL 400 MG PO TABS: 400.0000 mg | ORAL_TABLET | Freq: Every day | ORAL | Status: DC

## 2012-06-23 MED ORDER — SACCHAROMYCES BOULARDII 250 MG PO CAPS: 250.0000 mg | ORAL_CAPSULE | Freq: Two times a day (BID) | ORAL | Status: DC

## 2012-06-23 NOTE — Telephone Encounter (Signed)
Brooke [name] is aware rx's have been sent to the pharmacy. Nothing further was needed

## 2012-06-23 NOTE — Telephone Encounter (Signed)
i am ok with 7days of avelox 400mg /d plus the florastor  Advise Hospice that we are reaching limits of what we can effectively treat and need to focus more on comfort measures only.

## 2012-06-23 NOTE — Telephone Encounter (Signed)
I spoke with Brooke [name]. She is aware we will call in Avelox 400 mg QD plus the florastor. Advised her reaching limits of what we can effectively treat and need to focus more on comfort measures only. Brooke [name] voiced her understanding. Brooke [name] is also requesting 2 call in florastor for 2 weeks BID giving her hx of x diff. Please advise [name] thanks

## 2012-06-23 NOTE — Telephone Encounter (Signed)
I spoke with Pam, hospice nurse, and she has some concerns about the pt. She states the pt has been on 2 rounds of zpak in Jan with the last on finishing on 06-14-12. Pam states that the pt has never really improved. She states she is still having a productive cough with green phlegm, sleeping more, diminished lung sounds, low grade fever of 99.2, BP 86/48.  Pam also states that she has been having some jerking that makes her wonder if her CO2 is high? Pt sats have been mid 90's on 2 liters but the pt is requesting to increase her oxygen due to SOB but Pam has advised her not to do so. Pam states the pt has been on ceftin and avelox a few times over the last year and wonders if she doe snot need one of these again. Pam requests that if she is prescribed a strong abx that an rx for florastor also be called in because the pt has history of C-diff with strong abx. Pam states patient requests to be treated at home if possible.  Please advise. Carron Curie, CMA Allergies  Allergen Reactions  . Ambien (Zolpidem Tartrate)     Sleep walking and altered mental status  . Doxycycline     vomiting  . Levofloxacin Other (See Comments)    Unknown rxn.  Tolerates Cipro  . Mometasone Furoate     REACTION: nasonex causes swollen throat, thrush  . Penicillins   . Sulfonamide Derivatives

## 2012-06-23 NOTE — Telephone Encounter (Signed)
i am ok with the florastor order

## 2012-06-28 ENCOUNTER — Telehealth: Payer: Self-pay | Admitting: Critical Care Medicine

## 2012-06-28 NOTE — Telephone Encounter (Signed)
lmomtcb x1 for tracy 

## 2012-06-29 MED ORDER — MORPHINE SULFATE (CONCENTRATE) 20 MG/ML PO SOLN: ORAL | Status: DC

## 2012-06-29 MED ORDER — ALPRAZOLAM 0.5 MG PO TABS: 0.5000 mg | ORAL_TABLET | Freq: Four times a day (QID) | ORAL | Status: DC | PRN

## 2012-06-29 NOTE — Telephone Encounter (Signed)
Called spoke with [facility], she stated that they have urged pt to consider [facility] but pt is "desperate to stay independent"; pt's concentration wanes and she is a "very high fall risk" but still lucid enough to continue refusing.  French Ana stated that they will continue to urge pt toward [facility] and check on her frequently, especially w/ the new Roxanol start.  Regarding the Roxanol, French Ana stated that they typically start with the lowest dose at 2.5mg  (0.188mL) Roxanol prn every 3 hours.  Dr Delford Field please advise if this dose is acceptable.  Will need a physical rx faxed to [facility] - [place] ok to wait until this afternoon when PW is in the office.    The increase in the xanax to 0.5mg  four times daily has been changed on pt's med list and called to [facility] > #120 w/ 0 refills.  Telephoned to pharmacist Aneta Mins at [facility].

## 2012-06-29 NOTE — Telephone Encounter (Signed)
Spoke with French Ana and notified rx ready and was faxed to [facility] French Ana states nothing further needed

## 2012-06-29 NOTE — Telephone Encounter (Signed)
Consider [facility] sooner than later please!!!!! Ok for refills and dose changes

## 2012-06-29 NOTE — Telephone Encounter (Signed)
Brooke [name] saw the pt yesterday and says the pt is declining. She is using more of the Xanax and will run out before her next fill is due on 07/16/12. They are asking to increased her Xanax to four times a day and will nedd this calkled into [facility].  They are also requesting an RX for Roxanol, small dose, to help with pt's breakthrough pain, sob and anxiety. Pt has agreed to this and per Brooke [name], they can pre fill syringes for the pt and keep them in the locked medication box. This will ensure that the pt's daughter does not use this. The pt keeps the key pinned to her gown.  Brooke [name] says the pt is trying to maintain some independence but feels she will soon need to be moved to [facility]. PW, pls advise.

## 2012-06-29 NOTE — Telephone Encounter (Signed)
Roxanol rx printed for PW to sign.  Script and message given to [employer].

## 2012-06-29 NOTE — Telephone Encounter (Signed)
Ok on roxanol dose

## 2012-07-01 ENCOUNTER — Telehealth: Payer: Self-pay | Admitting: Critical Care Medicine

## 2012-07-01 NOTE — Telephone Encounter (Signed)
This would be fine.  

## 2012-07-01 NOTE — Telephone Encounter (Signed)
Spoke with pt's Hospice RB French Ana She states that pt started roxanol and the dose that works best is 0.5 ml every 3 hrs prn She is just needing a verbal okay with PW to give this dose Please advise thanks!

## 2012-07-01 NOTE — Telephone Encounter (Signed)
Spoke with Traci and notified of recs per PW She verbalized understanding and states nothing further needed

## 2012-07-05 ENCOUNTER — Telehealth: Payer: Self-pay | Admitting: Critical Care Medicine
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
## 2012-07-05 NOTE — Telephone Encounter (Signed)
I spoke with Pam. She stated made home visit to pt. Her bottle of morphine was completely empty that she received last week. Pt does not remember when she took the medication or how often. Per Pam the caregiver looks like she was "on something" and "pupils were very small". Per Pam the care giver wanted the refill more than the pt did. Per the pt she couldn't tell a difference if the medication helped. Pt does not care for refill. Per Pam the pt is sleeping more, is more forgetful. When pt is sleeping she is out and is a hard sleeper. Pt does have her key to her medication cabinet. Pam does not think she can maintain her medications. She tried to get pt into [facility] but pt refused. Per Pam pt is is taking the hydrocodone like "its candy". Please advise [name] thanks

## 2012-07-06 NOTE — Telephone Encounter (Signed)
Noted and thanks.

## 2012-07-06 NOTE — Telephone Encounter (Signed)
Pam is aware of PW recs and will discuss this with the other team members and the pt's regular hospice nurse, Jaymes Graff. They will call back this afternoon with an update for PW. They will see if the pt will agree with moving to [facility].

## 2012-07-07 ENCOUNTER — Telehealth: Payer: Self-pay | Admitting: Critical Care Medicine

## 2012-07-07 NOTE — Telephone Encounter (Signed)
PW pt. Pt is on Hospice. Refill is needed for the Hydromet cough syrup.  Also, pt has been taking her Maxzide once a day and still having 1+ to 2+ edema in her feet and ankles and 1/2 of the way of the calves.   Will forward the msg to Dr. Maple Hudson, doc of the day. Pls advise. Allergies  Allergen Reactions  . Ambien (Zolpidem Tartrate)     Sleep walking and altered mental status  . Doxycycline     vomiting  . Levofloxacin Other (See Comments)    Unknown rxn.  Tolerates Cipro  . Mometasone Furoate     REACTION: nasonex causes swollen throat, thrush  . Penicillins   . Sulfonamide Derivatives

## 2012-07-07 NOTE — Telephone Encounter (Signed)
Ok to refill hydromet. Do they know if her weight is actually changing? Dr Delford Field may want to change diuretic.

## 2012-07-08 MED ORDER — HYDROCODONE-HOMATROPINE 5-1.5 MG/5ML PO SYRP: 5.0000 mL | ORAL_SOLUTION | ORAL | Status: DC | PRN

## 2012-07-08 NOTE — Telephone Encounter (Signed)
Hydromet rx called into [facility].  Theres aware.  Spoke with [employer].  Thees states that yesterday she weighed pt.  She has a 3 lb wt loss since approx 1 months ago but has the edema in her feet and ankles.  Theres states pt was recently on liquid morphine along with the norco.  Reports during this time, pt was "really out of it."  States she wasn't eating much and was pretty sedated.  Theres states she is planning to d/c the liquid morphine bc it's not safe for her in the home.  Theres wonders if the swelling could be coming from the renal impact of pt taking the morphine along with the norco?  States pt's current order from physician is to take triamterene-hctz 1 tablet qod but pt  Started back taking this qd d/t the swelling.  Theres would like further orders on how pt should take this now or any recs PW may have.  Pls avise.  Thank you.  **Note:  When calling back, pls leave a detailed msg on Theres's VM if no answer.  She will call back if needed.

## 2012-07-08 NOTE — Telephone Encounter (Signed)
[name] returned call. [name]

## 2012-07-08 NOTE — Telephone Encounter (Signed)
Just take the diuretic daily, not QOD.  The edema is more likely due to progressive respiratory failure , not d/t morphine Please keep this pt comfortable!!!!  Quit focusing on the small issues, focus on the BIG picture!!!! She really now is COMFORT CARE ONLY

## 2012-07-08 NOTE — Telephone Encounter (Signed)
Theres returned call.  I informed her of below per [name].  Theres verbalized understanding of this.  She stated they are trying to focus on comfort care but pt demands attention to the "small symptoms" versus comfort care at times.  Theres states they are trying to keep pt safe along with providing comfort.  She will begin doing more comfort care teaching with pt.  Nothing further needed at this time.  Will sign off.

## 2012-07-08 NOTE — Telephone Encounter (Signed)
--  I left detailed message on [name] VM advising of PW recs. Advised tcb if had any further questions. Will sign off message

## 2012-07-11 ENCOUNTER — Telehealth: Payer: Self-pay | Admitting: Critical Care Medicine

## 2012-07-11 NOTE — Telephone Encounter (Signed)
Brooke [name] has called back.  Brooke [name] states that she is going to be w/ Brooke [name] in a little bit & would like to speak w/ someone prior to meeting w/ Brooke [name].  [name]
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
## 2012-07-11 NOTE — Telephone Encounter (Signed)
Noted and this msg will be added in the pt's chart so all nurses are aware.
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
## 2012-07-18 ENCOUNTER — Telehealth: Payer: Self-pay | Admitting: Critical Care Medicine

## 2012-07-18 MED ORDER — TRIAMTERENE-HCTZ 75-50 MG PO TABS: 1.0000 | ORAL_TABLET | ORAL | Status: DC

## 2012-07-18 MED ORDER — ALBUTEROL SULFATE HFA 108 (90 BASE) MCG/ACT IN AERS: 2.0000 | INHALATION_SPRAY | Freq: Four times a day (QID) | RESPIRATORY_TRACT | Status: DC | PRN

## 2012-07-18 NOTE — Telephone Encounter (Signed)
Noted and thanks.

## 2012-07-18 NOTE — Telephone Encounter (Signed)
Refill sent. Jaymes Graff is aware. Carron Curie, CMA

## 2012-07-18 NOTE — Telephone Encounter (Signed)
Spoke with Jaymes Graff She states just calling to update PW- Dr Gibson Ramp has not made any new med changes  I refilled ventolin for her Nothing further needed FYI for PW

## 2012-07-29 ENCOUNTER — Telehealth: Payer: Self-pay | Admitting: Critical Care Medicine

## 2012-07-29 NOTE — Telephone Encounter (Signed)
I spoke with Angelica Chessman and she stated pt is going to [facility] tomorrow to manage her meds and her pain management. When pt will be d/c she will be sent home with a better home medication management. This is just FYI for [name].

## 2012-07-29 NOTE — Telephone Encounter (Signed)
Perfect and tell them THANK YOU!!!

## 2012-08-04 ENCOUNTER — Telehealth: Payer: Self-pay | Admitting: Critical Care Medicine

## 2012-08-04 NOTE — Telephone Encounter (Signed)
Will forward to PW as FYI 

## 2012-08-04 NOTE — Telephone Encounter (Signed)
noted 

## 2012-08-08 ENCOUNTER — Telehealth: Payer: Self-pay | Admitting: Critical Care Medicine

## 2012-08-08 NOTE — Telephone Encounter (Signed)
Brooke [name] from hospice is calling and stated that the Dr. Anne Fu feels that the pt is taking too much of her cough syrup--hydromet,  and would like to know if PW feels that there is another medication that can be given to the pt to replace the hydromet.  Brooke [name] is aware that PW is out of the office until 3/10 and stated to leave a detailed VM on her machine once we know what PW recs.  Please advise. Thanks  Allergies  Allergen Reactions  . Ambien (Zolpidem Tartrate)     Sleep walking and altered mental status  . Doxycycline     vomiting  . Levofloxacin Other (See Comments)    Unknown rxn.  Tolerates Cipro  . Mometasone Furoate     REACTION: nasonex causes swollen throat, thrush  . Penicillins   . Sulfonamide Derivatives
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
## 2012-08-09 NOTE — Telephone Encounter (Signed)
Called, spoke with Chelsea.  I informed her of below per [name].  She verbalized understanding of this and will inform Dr. Anne Fu of PW's recs as well.  [name] voiced no further questions or concerns at this time.

## 2012-08-11 ENCOUNTER — Telehealth: Payer: Self-pay | Admitting: Critical Care Medicine

## 2012-08-11 NOTE — Telephone Encounter (Signed)
Called, spoke with [facility] with hospice.  She is calling as FYI for [name]. 1.  Dr. Gibson Ramp has changed a few of pt's medications:  Methadone 5 mg is now tid  Hydrocodone with tylenol 7.5/325 is now 1 q4h prn  ** Pt had hydrocodone 10/325 on her med list.  Brooke [name] stated the 10/325 has been d/c'd now.  I have updated pt's med list to reflect these medication changes.   2.  Reports pt is congested with green sputum and is emotional.  They are hoping change in above medications will help pt's emotions.    Per Brooke [name], this is FYI only and nothing further needed at this time.  Will sign off and route to PW as FYI.

## 2012-08-12 NOTE — Telephone Encounter (Signed)
Noted and thanks.

## 2012-08-15 ENCOUNTER — Telehealth: Payer: Self-pay | Admitting: Critical Care Medicine

## 2012-08-15 NOTE — Telephone Encounter (Signed)
i am ok with the hydromet refill  pw

## 2012-08-15 NOTE — Telephone Encounter (Signed)
Called spoke with Brooke [name] Patient is doing well with the Hydromet 10mL bid and Delsym bid Was given #171mL on 3.4.14 Brooke [name] is requesting a refill on the hydromet with a larger quantity PW not in office until tomorrow morning; Brooke [name] ok to wait until then Dr Delford Field please advise, thanks.

## 2012-08-15 NOTE — Telephone Encounter (Signed)
Brooke [name] is aware that [name] is okay with refill of Hydromet

## 2012-08-19 ENCOUNTER — Telehealth: Payer: Self-pay | Admitting: Critical Care Medicine

## 2012-08-19 NOTE — Telephone Encounter (Signed)
I spoke with Brooke [name]. She stated that Dr. Gibson Ramp stopped pt methadone. He started her on hydrocodone 7.5 but then had to increase her to 10-325 1 q4hrs prn. Pt is more alert now she is off the methadone. On 08/16/12 terese had ordered pt's hycodan cough syrup and when she went to visit pt yesterday she only had 1/2 bottle left. When she asked pt about this she stated her daughter was giving it to her wrong. Brooke [name] is still having trouble with getting pt to be compliant with this. Pt is refusing to use delsym bc she states it does nothing for her. She weighed pt and she is at 97lbs. This is FYI for [name].

## 2012-08-19 NOTE — Telephone Encounter (Signed)
Noted  

## 2012-08-19 NOTE — Telephone Encounter (Signed)
lmtcb x1 for terese.  

## 2012-08-22 ENCOUNTER — Telehealth: Payer: Self-pay | Admitting: Critical Care Medicine

## 2012-08-22 MED ORDER — HYDROCODONE-HOMATROPINE 5-1.5 MG/5ML PO SYRP: 10.0000 mL | ORAL_SOLUTION | Freq: Two times a day (BID) | ORAL | Status: DC

## 2012-08-22 NOTE — Telephone Encounter (Signed)
yes

## 2012-08-22 NOTE — Telephone Encounter (Signed)
We refilled this for the pt on 08/15/12.  Would you be okay with refilling this again? Thanks!

## 2012-08-22 NOTE — Telephone Encounter (Signed)
Called in rx per PW. I received a lot of grief from the pharmacy when calling this in. The pharmacist got an attitude and told me that the pt just got this and she shouldn't need it. I advised her that I was calling this in per [name]. She was huffy and stated that the pt still shouldn't need it.

## 2012-08-23 ENCOUNTER — Telehealth: Payer: Self-pay | Admitting: Critical Care Medicine
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
## 2012-08-23 NOTE — Telephone Encounter (Signed)
I spoke with Brooke [name] and is aware of this. She voiced her understanding and needed nothing further

## 2012-08-23 NOTE — Telephone Encounter (Signed)
At this stage, I would just refill the hydromet at amounts

## 2012-08-29 ENCOUNTER — Telehealth: Payer: Self-pay | Admitting: Critical Care Medicine

## 2012-08-29 MED ORDER — HYDROCODONE-HOMATROPINE 5-1.5 MG/5ML PO SYRP: 10.0000 mL | ORAL_SOLUTION | Freq: Two times a day (BID) | ORAL | Status: DC

## 2012-08-29 MED ORDER — ALPRAZOLAM 0.5 MG PO TABS: 0.5000 mg | ORAL_TABLET | Freq: Four times a day (QID) | ORAL | Status: DC | PRN

## 2012-08-29 NOTE — Telephone Encounter (Signed)
Called spoke with [name] who verified that pt is needing refills on hydromet and alprazolam.  Per [name], the 3.17.14 refill was picked up same day and pt has "maybe 3- or left.  Her last refill on the alprazolam 0.5mg  was 1.22.14 for #120, 1 tab by mouth four times daily.  Dr Delford Field, may pt have refills on these meds?  Thanks.

## 2012-08-29 NOTE — Telephone Encounter (Signed)
Spoke with [name] at [facility]. Aware Rx has been called in and she will inform patient. Rx called into [facility] and nothing further needed at this time.

## 2012-08-29 NOTE — Telephone Encounter (Signed)
Ok on refills

## 2012-09-05 ENCOUNTER — Telehealth: Payer: Self-pay | Admitting: Critical Care Medicine

## 2012-09-05 MED ORDER — TRAZODONE HCL 150 MG PO TABS: 150.0000 mg | ORAL_TABLET | Freq: Every day | ORAL | Status: DC

## 2012-09-05 MED ORDER — HYDROCODONE-HOMATROPINE 5-1.5 MG/5ML PO SYRP: 10.0000 mL | ORAL_SOLUTION | Freq: Two times a day (BID) | ORAL | Status: DC

## 2012-09-05 NOTE — Telephone Encounter (Signed)
Rx was called to [employer] with [name] and notified that this was done She verbalized understanding and states nothing further needed

## 2012-09-05 NOTE — Telephone Encounter (Signed)
Last Rx written on 08-29-12 for #131ml for Hydromet. Jaymes Graff is requesting refill. Please advise if ok to refill. Carron Curie, CMA

## 2012-09-05 NOTE — Telephone Encounter (Signed)
Rx was refilled LMOM for [name] to be made aware

## 2012-09-05 NOTE — Telephone Encounter (Signed)
i am ok, give 

## 2012-09-08 ENCOUNTER — Telehealth: Payer: Self-pay | Admitting: Critical Care Medicine

## 2012-09-08 MED ORDER — LOPERAMIDE HCL 2 MG PO TABS: 2.0000 mg | ORAL_TABLET | Freq: Four times a day (QID) | ORAL | Status: DC | PRN

## 2012-09-08 NOTE — Telephone Encounter (Signed)
[id number].Jennifer Castillo, CMA  

## 2012-09-08 NOTE — Telephone Encounter (Signed)
Spoke to [place] and she states that the pt has been having loose stools for 3 days. She is wanting to know if we can call in Imodium for the pt.  Please advise in PW absence. Thanks.

## 2012-09-08 NOTE — Telephone Encounter (Signed)
Per CY imodium 2mg  >< take 1 after each loose stool. #10 x1 lmom with hospice rx called in nothing further needed.

## 2012-09-12 ENCOUNTER — Telehealth: Payer: Self-pay | Admitting: Critical Care Medicine
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
## 2012-09-12 NOTE — Telephone Encounter (Signed)
LMTCB
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
## 2012-09-12 NOTE — Telephone Encounter (Signed)
Noted , very little I can do in this situatio

## 2012-09-15 ENCOUNTER — Telehealth: Payer: Self-pay | Admitting: Critical Care Medicine

## 2012-09-15 MED ORDER — TRIAMCINOLONE ACETONIDE 0.025 % EX CREA
TOPICAL_CREAM | CUTANEOUS | Status: DC
Start: 2012-09-15 — End: 2012-10-24

## 2012-09-15 NOTE — Telephone Encounter (Signed)
Just call in triamcinolone cream  30gm tube apply tid prn

## 2012-09-15 NOTE — Telephone Encounter (Signed)
Verlon Au called and spoke with terese and she is aware of meds sent in to the pharmacy for the pt. Nothing further is needed.

## 2012-09-15 NOTE — Telephone Encounter (Signed)
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  terese is requesting that something be called in for the pt since this area is very painful.  PW are you willing to call something in for the pt today since she does not have a primary care doctor.  Please advise. Thanks  Allergies  Allergen Reactions  . Ambien (Zolpidem Tartrate)     Sleep walking and altered mental status  . Doxycycline     vomiting  . Levofloxacin Other (See Comments)    Unknown rxn.  Tolerates Cipro  . Mometasone Furoate     REACTION: nasonex causes swollen throat, thrush  . Penicillins   . Sulfonamide Derivatives

## 2012-09-21 ENCOUNTER — Telehealth: Payer: Self-pay | Admitting: Critical Care Medicine

## 2012-09-21 MED ORDER — HYDROCODONE-HOMATROPINE 5-1.5 MG/5ML PO SYRP: 10.0000 mL | ORAL_SOLUTION | Freq: Two times a day (BID) | ORAL | Status: DC

## 2012-09-21 NOTE — Telephone Encounter (Signed)
Ok to refill 

## 2012-09-21 NOTE — Telephone Encounter (Signed)
Called spoke with patient who reports worsening cough x2days with "horrible cough spells that come and go" and chest congestion Unable to produce any mucus Reports Brooke [name] with Hospice heard some expiratory wheezing on 4.14.14 Pt is requesting a refill on her hycodan Last fill was 3.31.14 for #246mL Has been taking only 5mL "to stretch it out", rather than the 10mL prescribed  Dr Delford Field please advise if okay to fill, thanks! [facility] Allergies  Allergen Reactions  . Ambien (Zolpidem Tartrate)     Sleep walking and altered mental status  . Doxycycline     vomiting  . Levofloxacin Other (See Comments)    Unknown rxn.  Tolerates Cipro  . Mometasone Furoate     REACTION: nasonex causes swollen throat, thrush  . Penicillins   . Sulfonamide Derivatives

## 2012-09-21 NOTE — Telephone Encounter (Signed)
Rx has been called in per PW. Pt is aware.

## 2012-09-26 ENCOUNTER — Telehealth: Payer: Self-pay | Admitting: Critical Care Medicine

## 2012-09-26 ENCOUNTER — Telehealth: Payer: Self-pay | Admitting: Orthopedic Surgery

## 2012-09-26 NOTE — Telephone Encounter (Signed)
[name], hospice called back to check on status of this.  Brooke [name]

## 2012-09-26 NOTE — Telephone Encounter (Signed)
i am ok with increase and refill

## 2012-09-26 NOTE — Telephone Encounter (Signed)
I spoke with Jaymes Graff and she stated Dr. Gibson Ramp has been out sick this is the 2nd week now. Jaymes Graff is wanting to know if [name] will okay for pt to increase the hydrocodone 1-2 tabs every 4 hrs PRN. Per terese pt has been complaining about the pain in her ribs/chest/lungs 6/10. Jaymes Graff is fine with a call back tomorrow. Pt will need RX called in for [name] okay's this. Please advise thanks

## 2012-09-26 NOTE — Telephone Encounter (Signed)
Message closed in error copied below:  Reason for Call    med - dosage increase           Call Documentation    [name] at 09/26/2012 3:12 PM    Status: Signed             [name], hospice called back to check on status of this. [name]              Encounter MyChart Messages    No messages in this encounter         Routing History    Priority Sent On From To Message Type    09/26/2012 11:10 AM [name] [facility] Triage Pool Patient Calls      Created by    [name] on 09/26/2012 11:06 AM                           Visit Pharmacy    [address] - [place], [place] - [address]         Contacts      Type Contact Phone   09/26/2012 11:06 AM Phone (Incoming) [name] - Hospice nurse [phone]   Pt wants an increase of hydrocodone w/ tylenol. Nurse says pt is taking 6-7 w/ in 24 hr. Pain management dr is out sick for the next week- [facility] - nurse will see pt today

## 2012-09-27 MED ORDER — HYDROCODONE-ACETAMINOPHEN 7.5-325 MG PO TABS: 1.0000 | ORAL_TABLET | ORAL | Status: DC | PRN

## 2012-09-27 NOTE — Telephone Encounter (Signed)
Please call pt at [phone].  She states she is down to two pills & needs to get this called in prior to her pharmacy closing today.  Pt has someone to pick up the Rx.  Brooke [name]
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
## 2012-09-27 NOTE — Telephone Encounter (Signed)
[name] from Hospice called back regarding pt's rx to be sent to [facility].  She can be reached @ [phone]. Leanora Ivanoff

## 2012-09-27 NOTE — Telephone Encounter (Signed)
Brooke [name] is aware of PW being okay with increasing the pt's medication. Rx will be called in to her pharmacy.  PW - what quantity would you like [place] to call in? Thanks.

## 2012-09-27 NOTE — Telephone Encounter (Signed)
Call in #90

## 2012-09-27 NOTE — Telephone Encounter (Signed)
Rx has been called in per PW. Spoke to the pt to let her know that this had been taken care of. She asked how many tablets we called in for and when I told her #90 she stated that this amount wasn't going to last long. According to her she had been getting #180. I advised her that she should only be taking this as needed. She stated, "I have to have 2 pills exactly every 4 hours." Pt stated that she will discuss this with her hospice nurse and she will see how long the 90 tablets will last her. Nothing further was needed.

## 2012-09-28 ENCOUNTER — Telehealth: Payer: Self-pay | Admitting: Critical Care Medicine

## 2012-09-28 NOTE — Telephone Encounter (Signed)
I spoke with Jaymes Graff. She stated they will do a recert visit with pt next week. They will give [place] a call with a report. Per Jaymes Graff they have not seen a significant decline in pt but Dr. Gibson Ramp will make that decision. This is FYI for [name].

## 2012-09-29 NOTE — Telephone Encounter (Signed)
Hard to believe given the number of phone calls we get weekly for care issues.Marland KitchenMarland Kitchen

## 2012-09-30 ENCOUNTER — Telehealth: Payer: Self-pay | Admitting: Critical Care Medicine

## 2012-09-30 ENCOUNTER — Telehealth: Payer: Self-pay | Admitting: *Deleted

## 2012-09-30 NOTE — Telephone Encounter (Signed)
Message copied by Gweneth Dimitri D on Fri Sep 30, 2012 11:24 AM ------      Message from: Shan Levans E      Created: Thu Sep 29, 2012  3:18 PM       We need to get this lady into the office for eval      soon ------

## 2012-09-30 NOTE — Telephone Encounter (Signed)
Called, spoke with pt.   Informed her PW would like her to come in for OV for hospice. She verbalized understanding of this. Pt states she will have to see when her daughter can bring her as she doesn't drive anymore. She will find out when daughter can bring her in next week and will call us back. Pt needs to see PW only.

## 2012-09-30 NOTE — Telephone Encounter (Signed)
i agree with this recommendation

## 2012-09-30 NOTE — Telephone Encounter (Signed)
Please see phone msg from 09/30/12. Pt will need to come in for OV with PW.

## 2012-09-30 NOTE — Telephone Encounter (Signed)
Called, spoke with [place].  States pt had 3 loose stools last night and this am.  No vomiting.  No new meds. She is taking the imodium.  [name] spoke with Dr. Anne Fu.  He would like pt to cont with the imodium.  Jaymes Graff has imformed pt to cont with imodium and to watch her diet to avoid food that could cause diarrhea.  She did encourage pt to cont to drink plenty of fluids.  She is calling as FYI for PW.  No call back unless he has anything he would like to add. Will route to PW as FYI.

## 2012-09-30 NOTE — Telephone Encounter (Signed)
Pt scheduled to see PW on Wed, April 30 as this is the only day her daughter could bring her in next week.  Will sign off.

## 2012-09-30 NOTE — Telephone Encounter (Signed)
Per pt call back only can come on wed confirmed with crystal the time .

## 2012-10-05 ENCOUNTER — Other Ambulatory Visit (INDEPENDENT_AMBULATORY_CARE_PROVIDER_SITE_OTHER)

## 2012-10-05 ENCOUNTER — Telehealth: Payer: Self-pay | Admitting: Critical Care Medicine

## 2012-10-05 ENCOUNTER — Encounter: Payer: Self-pay | Admitting: Critical Care Medicine

## 2012-10-05 ENCOUNTER — Ambulatory Visit (INDEPENDENT_AMBULATORY_CARE_PROVIDER_SITE_OTHER)
Admission: RE | Admit: 2012-10-05 | Discharge: 2012-10-05 | Disposition: A | Discharge status: Home / Self Care | Payer: Medicare Other | Source: Ambulatory Visit | Attending: Critical Care Medicine | Admitting: Critical Care Medicine

## 2012-10-05 ENCOUNTER — Ambulatory Visit (INDEPENDENT_AMBULATORY_CARE_PROVIDER_SITE_OTHER): Payer: Medicare Other | Admitting: Critical Care Medicine

## 2012-10-05 VITALS — BP 100/62 | HR 85 | Temp 98.0°F | Ht 64.0 in | Wt 98.7 lb

## 2012-10-05 DIAGNOSIS — J479 Bronchiectasis, uncomplicated: Secondary | ICD-10-CM

## 2012-10-05 DIAGNOSIS — E440 Moderate protein-calorie malnutrition: Secondary | ICD-10-CM

## 2012-10-05 DIAGNOSIS — J9610 Chronic respiratory failure, unspecified whether with hypoxia or hypercapnia: Secondary | ICD-10-CM

## 2012-10-05 LAB — CBC WITH DIFFERENTIAL/PLATELET
Basophils Absolute: 0 K/uL (ref 0.0–0.1)
Basophils Relative: 0.3 % (ref 0.0–3.0)
Eosinophils Absolute: 0.4 K/uL (ref 0.0–0.7)
Eosinophils Relative: 3.4 % (ref 0.0–5.0)
HCT: 34.2 % — ABNORMAL LOW (ref 36.0–46.0)
Hemoglobin: 11.5 g/dL — ABNORMAL LOW (ref 12.0–15.0)
Lymphocytes Relative: 22.1 % (ref 12.0–46.0)
Lymphs Abs: 2.6 K/uL (ref 0.7–4.0)
MCHC: 33.7 g/dL (ref 30.0–36.0)
MCV: 86.7 fl (ref 78.0–100.0)
Monocytes Absolute: 0.7 K/uL (ref 0.1–1.0)
Monocytes Relative: 6.1 % (ref 3.0–12.0)
Neutro Abs: 7.9 K/uL — ABNORMAL HIGH (ref 1.4–7.7)
Neutrophils Relative %: 68.1 % (ref 43.0–77.0)
Platelets: 402 K/uL — ABNORMAL HIGH (ref 150.0–400.0)
RBC: 3.95 Mil/uL (ref 3.87–5.11)
RDW: 16.1 % — ABNORMAL HIGH (ref 11.5–14.6)
WBC: 11.6 K/uL — ABNORMAL HIGH (ref 4.5–10.5)

## 2012-10-05 LAB — COMPREHENSIVE METABOLIC PANEL
AST: 23 U/L (ref 0–37)
Albumin: 3.2 g/dL — ABNORMAL LOW (ref 3.5–5.2)
BUN: 21 mg/dL (ref 6–23)
Calcium: 9.2 mg/dL (ref 8.4–10.5)
Chloride: 100 mEq/L (ref 96–112)
Glucose, Bld: 81 mg/dL (ref 70–99)
Potassium: 3.9 mEq/L (ref 3.5–5.1)
Total Protein: 7.2 g/dL (ref 6.0–8.3)

## 2012-10-05 MED ORDER — LEVOTHYROXINE SODIUM 50 MCG PO TABS: 50.0000 ug | ORAL_TABLET | Freq: Every day | ORAL | Status: DC

## 2012-10-05 MED ORDER — OXYCODONE HCL 10 MG PO TABS: ORAL_TABLET | ORAL | Status: DC

## 2012-10-05 MED ORDER — GABAPENTIN 300 MG PO CAPS: 300.0000 mg | ORAL_CAPSULE | Freq: Three times a day (TID) | ORAL | Status: DC

## 2012-10-05 MED ORDER — HYDROCODONE-HOMATROPINE 5-1.5 MG/5ML PO SYRP: 10.0000 mL | ORAL_SOLUTION | Freq: Two times a day (BID) | ORAL | Status: DC

## 2012-10-05 NOTE — Assessment & Plan Note (Signed)
Moderate PCM Note weight down 10# in one year. Note low albumen but not severe Poor PO intake Home bound Plan Cont supp with ensure

## 2012-10-05 NOTE — Progress Notes (Signed)
Quick Note:  Notify the patient that the Xray is stable and no pneumonia No change in medications are recommended. Continue current meds as prescribed at last office visit ______ 

## 2012-10-05 NOTE — Progress Notes (Signed)
Quick Note:  Call pt and tell her labs are ok, No change in medications ______ 

## 2012-10-05 NOTE — Assessment & Plan Note (Addendum)
Bronchiectasis end stage with associated chronic respiratory failure. This pt has severe protein calorie malnutrition, chronic respiratory failure, ongoing weight loss, severe chronic pain. It is my medical opinion that this patient should be re certified for hospice care During the time she has been in the hospice program she has not required hospitalization or emergency room visits  Plan Change norco to oxycodone 10mg  1-2 every 4 hours as needed Try neurontin (gabapentin) 300mg  three times a day on schedule for pain Stay on nebulizer Cough med refilled  All meds sent to pharmacy for delivery THis pt should be recertified to hospice  Labs checked today, Chest xray today Return as needed, will try to care for you via Hospice /over the phone

## 2012-10-05 NOTE — Patient Instructions (Addendum)
A chest xray will be obtained You will re qualify for hospice Change norco to oxycodone 10mg  1-2 every 4 hours as needed Try neurontin (gabapentin) 300mg  three times a day on schedule for pain Stay on nebulizer Cough med refilled  All meds sent to pharmacy for delivery Labs today Return as needed, will try to care for you via Hospice /over the phone NO PAIN MED SHARING!!

## 2012-10-05 NOTE — Telephone Encounter (Signed)
[name] is re faxing RX right now. Pharmacy is aware

## 2012-10-05 NOTE — Progress Notes (Signed)
Subjective:    Patient ID: Brooke [name], female    DOB: 12-03-1940, 72 y.o.   MRN: [id number]  HPI  72 y.o..WF with Bronchiectasis. MRSA colonization  10/05/2012 Still in hospice.  Still coughs mucus green every day . Pt wont eat, takes 5 cans of pediasure per day. Coughs up three tbsp per day.  Severe hack and cough syrup helps.  Chest pain stays severe.  Cough syrup filled 09/21/12 .  Pain meds:   #90 10-325 1-2 every 4hours.  Pain is r side, if coughs sharp, if not coughing is a constant grabbing pain.  Not ambulatory, not able to take deep breath,  Now is home bound.     Past Medical History  Diagnosis Date  . Hypothyroidism   . History of allergy   . Mycobacterium avium complex   . Bronchiectasis     colonized mrsa, s/p vancomyin IV 10 days 5/10 and 7 days 6/10  . Pneumonia   . Headache      No family history on file.   History   Social History  . Marital Status: Widowed    Spouse Name: N/A    Number of Children: N/A  . Years of Education: N/A   Occupational History  . Not on file.   Social History Main Topics  . Smoking status: Former Smoker -- 1.00 packs/day for 3 years    Types: Cigarettes    Quit date: 06/08/1962  . Smokeless tobacco: Never Used  . Alcohol Use: No  . Drug Use: No  . Sexually Active: Not on file   Other Topics Concern  . Not on file   Social History Narrative  . No narrative on file     Allergies  Allergen Reactions  . Ambien (Zolpidem Tartrate)     Sleep walking and altered mental status  . Doxycycline     vomiting  . Levofloxacin Other (See Comments)    Unknown rxn.  Tolerates Cipro  . Mometasone Furoate     REACTION: nasonex causes swollen throat, thrush  . Penicillins   . Sulfonamide Derivatives      Outpatient Prescriptions Prior to Visit  Medication Sig Dispense Refill  . albuterol (PROVENTIL) (2.5 MG/3ML) 0.083% nebulizer solution Take 3 mLs (2.5 mg total) by nebulization every 6 (six) hours as needed.  360 mL  5   . albuterol (VENTOLIN HFA) 108 (90 BASE) MCG/ACT inhaler Inhale 2 puffs into the lungs every 6 (six) hours as needed.  1 Inhaler  3  . ALPRAZolam (XANAX) 0.5 MG tablet Take 1 tablet (0.5 mg total) by mouth 4 (four) times daily as needed for anxiety.  120 tablet  0  . aluminum & magnesium hydroxide-simethicone (MYLANTA) 500-450-40 MG/5ML suspension Take 5 mLs by mouth as needed for indigestion.  360 mL  3  . citalopram (CELEXA) 40 MG tablet Take 1 tablet (40 mg total) by mouth daily.  30 tablet  6  . fluticasone (FLONASE) 50 MCG/ACT nasal spray 2 sprays each nostril daily-hospice  16 g  0  . loperamide (IMODIUM) 2 MG capsule Take 1 capsule (2 mg total) by mouth as needed. For diarrhea relief after each loose stool.  30 capsule  0  . prochlorperazine (COMPAZINE) 10 MG tablet Take 1 tablet (10 mg total) by mouth every 6 (six) hours as needed.  30 tablet  6  . traZODone (DESYREL) 150 MG tablet Take 1 tablet (150 mg total) by mouth at bedtime.  30 tablet  2  .  triamcinolone (KENALOG) 0.025 % cream Apply to area three times daily as needed  30 g  1  . triamterene-hydrochlorothiazide (MAXZIDE) 75-50 MG per tablet Take 1 tablet by mouth every other day.  30 tablet  2  . HYDROcodone-homatropine (HYCODAN) 5-1.5 MG/5ML syrup Take 10 mLs by mouth 2 (two) times daily.  240 mL  0  . levothyroxine (SYNTHROID, LEVOTHROID) 100 MCG tablet Take 0.5 tablets (50 mcg total) by mouth daily before breakfast.  15 tablet  5  . loperamide (IMODIUM A-D) 2 MG tablet Take 1 tablet (2 mg total) by mouth 4 (four) times daily as needed for diarrhea or loose stools.  30 tablet  0  . phenazopyridine (PYRIDIUM) 100 MG tablet Take 1 tablet (100 mg total) by mouth 3 (three) times daily as needed for pain.  10 tablet  0  . azelastine (ASTELIN) 137 MCG/SPRAY nasal spray Place 2 sprays into the nose at bedtime. Use in each nostril as directed  30 mL  12  . Azelastine-Fluticasone (DYMISTA) 137-50 MCG/ACT SUSP Place 1 puff into the nose 2  (two) times daily.  7 g  1  . colchicine 0.6 MG tablet Take 1 tablet (0.6 mg total) by mouth 2 (two) times daily.  30 tablet  0  . dextromethorphan (DELSYM) 30 MG/5ML liquid Take 60 mg by mouth 2 (two) times daily.      Marland Kitchen HYDROcodone-acetaminophen (NORCO) 7.5-325 MG per tablet Take 1-2 tablets by mouth every 4 (four) hours as needed for pain.  90 tablet  0  . methadone (DOLOPHINE) 5 MG tablet Take 5 mg by mouth 3 (three) times daily.      . metroNIDAZOLE (FLAGYL) 250 MG tablet Take 1 tablet (250 mg total) by mouth 3 (three) times daily.  21 tablet  0  . morphine (ROXANOL) 20 MG/ML concentrated solution 2.5mg  (0.110mL) every 3 hours as needed.  HOSPICE PATIENT  15 mL  0  . nitrofurantoin, macrocrystal-monohydrate, (MACROBID) 100 MG capsule Take 1 capsule (100 mg total) by mouth 2 (two) times daily. X 7 days  14 capsule  0  . ondansetron (ZOFRAN) 4 MG tablet       . saccharomyces boulardii (FLORASTOR) 250 MG capsule Take 1 capsule (250 mg total) by mouth 2 (two) times daily.  28 capsule  0   No facility-administered medications prior to visit.      Review of Systems  Constitutional:   No  weight loss, night sweats,  Fevers, chills, fatigue, lassitude. HEENT:   No headaches,  Difficulty swallowing,  Tooth/dental problems,  Sore throat,                No sneezing, itching, ear ache,  +nasal congestion, post nasal drip,   CV:  Notes  chest pain,  Orthopnea, PND, swelling in lower extremities, anasarca, dizziness, palpitations  GI  No heartburn, indigestion, abdominal pain,  nausea,  Neg  diarrhea, change in bowel habits, loss of appetite  Resp: Notes  shortness of breath with exertion not  at rest.    No coughing up of blood.    No chest wall deformity  Skin: no rash or lesions.  GU: no dysuria, change in color of urine, no urgency or frequency.  No flank pain.  MS:  No joint pain or swelling.  No decreased range of motion.  No back pain.  Psych:  No change in mood or affect. No  depression or anxiety.  No memory loss.     Objective:   Physical  Exam   BP 100/62  Pulse 85  Temp(Src) 98 F (36.7 C) (Oral)  Ht 5\' 4"  (1.626 m)  Wt 98 lb 11.2 oz (44.77 kg)  BMI 16.93 kg/m2  SpO2 93%   Gen: Thin and cachectic white female  ENT: No lesions,  mouth clear,  oropharynx clear,   Neck: No JVD, no TMG, no carotid bruits  Lungs: No use of accessory muscles, no dullness to percussion, coarse BS w/ few rhonchi   Cardiovascular: RRR, heart sounds normal, no murmur or gallops, no peripheral edema  Abdomen: soft and NT, no HSM,  BS normal  Musculoskeletal: No deformities, no cyanosis or clubbing  Neuro: alert, non focal  Skin: Warm, no lesions or rashes    Cleda Daub 10/05/2012: FeV1 42% Severe obstruction/ restriction FVC 41%  Recent Labs Lab 10/05/12 1016  HGB 11.5*  HCT 34.2*  WBC 11.6*  PLT 402.0*    Recent Labs Lab 10/05/12 1016  NA 137  K 3.9  CL 100  CO2 29  GLUCOSE 81  BUN 21  CREATININE 1.3*  CALCIUM 9.2    Recent Labs Lab 10/05/12 1016  AST 23  ALT 12  ALKPHOS 74  BILITOT 0.3  PROT 7.2  ALBUMIN 3.2*   Dg Chest 2 View  10/05/2012  *RADIOLOGY REPORT*  Clinical Data: Cough, shortness of breath and bronchiectasis.  CHEST - 2 VIEW  Comparison: 10/23/2011  Findings: Stable appearance of chronic lung disease and left lower lung calcified granuloma.  No definite focal infiltrate, edema or pneumothorax is identified.  No significant pleural effusions. Heart size is within normal limits.  The bony thorax shows stable osteopenia.  IMPRESSION: Stable chronic lung disease.  No acute infiltrate identified.   Original Report Authenticated By: Irish Lack, M.D.     Assessment & Plan:     BRONCHIECTASIS Bronchiectasis end stage with associated chronic respiratory failure. This pt has severe protein calorie malnutrition, chronic respiratory failure, ongoing weight loss, severe chronic pain. It is my medical opinion that this patient should be  re certified for hospice care During the time she has been in the hospice program she has not required hospitalization or emergency room visits Chronic chest wall pain. Poorly responsive to narcotics Plan Change norco to oxycodone 10mg  1-2 every 4 hours as needed Try neurontin (gabapentin) 300mg  three times a day on schedule for pain Stay on nebulizer Cough med refilled  All meds sent to pharmacy for delivery THis pt should be recertified to hospice  Return as needed, will try to care for you via Hospice /over the phone    Chronic respiratory failure Chronic resp failure oxygen dependent QHS and with exertion Cont same  Protein-calorie malnutrition, moderate Moderate PCM Note weight down 10# in one year. Note low albumen but not severe Poor PO intake Home bound Plan Cont supp with ensure     Updated Medication List Outpatient Encounter Prescriptions as of 10/05/2012  Medication Sig Dispense Refill  . albuterol (PROVENTIL) (2.5 MG/3ML) 0.083% nebulizer solution Take 3 mLs (2.5 mg total) by nebulization every 6 (six) hours as needed.  360 mL  5  . albuterol (VENTOLIN HFA) 108 (90 BASE) MCG/ACT inhaler Inhale 2 puffs into the lungs every 6 (six) hours as needed.  1 Inhaler  3  . ALPRAZolam (XANAX) 0.5 MG tablet Take 1 tablet (0.5 mg total) by mouth 4 (four) times daily as needed for anxiety.  120 tablet  0  . aluminum & magnesium hydroxide-simethicone (MYLANTA) 500-450-40 MG/5ML suspension Take 5  mLs by mouth as needed for indigestion.  360 mL  3  . citalopram (CELEXA) 40 MG tablet Take 1 tablet (40 mg total) by mouth daily.  30 tablet  6  . fluticasone (FLONASE) 50 MCG/ACT nasal spray 2 sprays each nostril daily-hospice  16 g  0  . HYDROcodone-homatropine (HYCODAN) 5-1.5 MG/5ML syrup Take 10 mLs by mouth 2 (two) times daily.  480 mL  0  . levothyroxine (SYNTHROID, LEVOTHROID) 50 MCG tablet Take 1 tablet (50 mcg total) by mouth daily before breakfast.  30 tablet  6  . loperamide  (IMODIUM) 2 MG capsule Take 1 capsule (2 mg total) by mouth as needed. For diarrhea relief after each loose stool.  30 capsule  0  . phenazopyridine (PYRIDIUM) 100 MG tablet Take 100 mg by mouth 3 (three) times daily as needed.      . prochlorperazine (COMPAZINE) 10 MG tablet Take 1 tablet (10 mg total) by mouth every 6 (six) hours as needed.  30 tablet  6  . traZODone (DESYREL) 150 MG tablet Take 1 tablet (150 mg total) by mouth at bedtime.  30 tablet  2  . triamcinolone (KENALOG) 0.025 % cream Apply to area three times daily as needed  30 g  1  . triamterene-hydrochlorothiazide (MAXZIDE) 75-50 MG per tablet Take 1 tablet by mouth every other day.  30 tablet  2  . [DISCONTINUED] HYDROcodone-acetaminophen (NORCO) 10-325 MG per tablet Take 1-2 tablets by mouth every 4 (four) hours as needed for pain.      . [DISCONTINUED] HYDROcodone-homatropine (HYCODAN) 5-1.5 MG/5ML syrup Take 10 mLs by mouth 2 (two) times daily.  240 mL  0  . [DISCONTINUED] levothyroxine (SYNTHROID, LEVOTHROID) 100 MCG tablet Take 0.5 tablets (50 mcg total) by mouth daily before breakfast.  15 tablet  5  . [DISCONTINUED] loperamide (IMODIUM A-D) 2 MG tablet Take 1 tablet (2 mg total) by mouth 4 (four) times daily as needed for diarrhea or loose stools.  30 tablet  0  . [DISCONTINUED] phenazopyridine (PYRIDIUM) 100 MG tablet Take 1 tablet (100 mg total) by mouth 3 (three) times daily as needed for pain.  10 tablet  0  . gabapentin (NEURONTIN) 300 MG capsule Take 1 capsule (300 mg total) by mouth 3 (three) times daily.  90 capsule  6  . Oxycodone HCl 10 MG TABS One to two every 4 hours as needed for pain  90 tablet  0  . [DISCONTINUED] azelastine (ASTELIN) 137 MCG/SPRAY nasal spray Place 2 sprays into the nose at bedtime. Use in each nostril as directed  30 mL  12  . [DISCONTINUED] Azelastine-Fluticasone (DYMISTA) 137-50 MCG/ACT SUSP Place 1 puff into the nose 2 (two) times daily.  7 g  1  . [DISCONTINUED] colchicine 0.6 MG tablet  Take 1 tablet (0.6 mg total) by mouth 2 (two) times daily.  30 tablet  0  . [DISCONTINUED] dextromethorphan (DELSYM) 30 MG/5ML liquid Take 60 mg by mouth 2 (two) times daily.      . [DISCONTINUED] HYDROcodone-acetaminophen (NORCO) 7.5-325 MG per tablet Take 1-2 tablets by mouth every 4 (four) hours as needed for pain.  90 tablet  0  . [DISCONTINUED] methadone (DOLOPHINE) 5 MG tablet Take 5 mg by mouth 3 (three) times daily.      . [DISCONTINUED] metroNIDAZOLE (FLAGYL) 250 MG tablet Take 1 tablet (250 mg total) by mouth 3 (three) times daily.  21 tablet  0  . [DISCONTINUED] morphine (ROXANOL) 20 MG/ML concentrated solution 2.5mg  (0.193mL) every  3 hours as needed.  HOSPICE PATIENT  15 mL  0  . [DISCONTINUED] nitrofurantoin, macrocrystal-monohydrate, (MACROBID) 100 MG capsule Take 1 capsule (100 mg total) by mouth 2 (two) times daily. X 7 days  14 capsule  0  . [DISCONTINUED] ondansetron (ZOFRAN) 4 MG tablet       . [DISCONTINUED] saccharomyces boulardii (FLORASTOR) 250 MG capsule Take 1 capsule (250 mg total) by mouth 2 (two) times daily.  28 capsule  0   No facility-administered encounter medications on file as of 10/05/2012.

## 2012-10-05 NOTE — Assessment & Plan Note (Signed)
Chronic resp failure oxygen dependent QHS and with exertion Cont same

## 2012-10-06 NOTE — Progress Notes (Signed)
Quick Note:  Called, spoke with pt. Informed her of lab results and recs per [name]. She verbalized understanding of this and voiced no further questions or concerns at this time. ______

## 2012-10-06 NOTE — Progress Notes (Signed)
Quick Note:  lmomtcb for pt ______ 

## 2012-10-06 NOTE — Progress Notes (Signed)
Quick Note:  Called, spoke with pt. Informed her of cxr results and recs per Dr. Wright. She verbalized understanding and voiced no further questions or concerns at this time. ______ 

## 2012-10-10 ENCOUNTER — Other Ambulatory Visit: Payer: Self-pay | Admitting: *Deleted

## 2012-10-10 MED ORDER — LOPERAMIDE HCL 2 MG PO CAPS: 2.0000 mg | ORAL_CAPSULE | ORAL | Status: DC | PRN

## 2012-10-17 ENCOUNTER — Telehealth: Payer: Self-pay | Admitting: Critical Care Medicine

## 2012-10-17 NOTE — Telephone Encounter (Signed)
Called, spoke with [facility] with Hospice.   Calling as FYI for PW:  Pt is going to [facility] x 5 nights for a Respite Stay. Will route to PW as FYI.

## 2012-10-17 NOTE — Telephone Encounter (Signed)
noted 

## 2012-10-18 ENCOUNTER — Telehealth: Payer: Self-pay | Admitting: Critical Care Medicine

## 2012-10-18 NOTE — Telephone Encounter (Signed)
I spoke with Brooke [name]. She stated pt is over at [facility] for a 5 night respite stay. [name] had wrote for her hydorcan 10 ML's BID and to use delsym in b/w. Pt does not like delsym and refuses to use it. Pt has been doing 5 ML's of the hycodan QD to spread it out. D/t how the rx is written [facility] will not give pt the hycodan QID. Brooke [name] just needs [place] to give her a verbal to send order over to [facility] for this  I went and spoke with PW personally and he stated this was fine to do so. I called and spoke with Brooke [name] and made her aware of this. Nothing further was needed

## 2012-10-24 ENCOUNTER — Encounter (HOSPITAL_COMMUNITY): Payer: Self-pay | Admitting: *Deleted

## 2012-10-24 ENCOUNTER — Telehealth: Payer: Self-pay | Admitting: Critical Care Medicine

## 2012-10-24 ENCOUNTER — Emergency Department (HOSPITAL_COMMUNITY)
Admission: EM | Admit: 2012-10-24 | Discharge: 2012-10-24 | Disposition: A | Discharge status: Home / Self Care | Attending: Emergency Medicine | Admitting: Emergency Medicine

## 2012-10-24 ENCOUNTER — Emergency Department (HOSPITAL_COMMUNITY)

## 2012-10-24 DIAGNOSIS — Y929 Unspecified place or not applicable: Secondary | ICD-10-CM | POA: Insufficient documentation

## 2012-10-24 DIAGNOSIS — Z79899 Other long term (current) drug therapy: Secondary | ICD-10-CM | POA: Insufficient documentation

## 2012-10-24 DIAGNOSIS — E039 Hypothyroidism, unspecified: Secondary | ICD-10-CM | POA: Insufficient documentation

## 2012-10-24 DIAGNOSIS — Z8619 Personal history of other infectious and parasitic diseases: Secondary | ICD-10-CM | POA: Insufficient documentation

## 2012-10-24 DIAGNOSIS — Z87891 Personal history of nicotine dependence: Secondary | ICD-10-CM | POA: Insufficient documentation

## 2012-10-24 DIAGNOSIS — W010XXA Fall on same level from slipping, tripping and stumbling without subsequent striking against object, initial encounter: Secondary | ICD-10-CM | POA: Insufficient documentation

## 2012-10-24 DIAGNOSIS — IMO0002 Reserved for concepts with insufficient information to code with codable children: Secondary | ICD-10-CM | POA: Insufficient documentation

## 2012-10-24 DIAGNOSIS — M549 Dorsalgia, unspecified: Secondary | ICD-10-CM

## 2012-10-24 DIAGNOSIS — Z8709 Personal history of other diseases of the respiratory system: Secondary | ICD-10-CM | POA: Insufficient documentation

## 2012-10-24 DIAGNOSIS — Y939 Activity, unspecified: Secondary | ICD-10-CM | POA: Insufficient documentation

## 2012-10-24 DIAGNOSIS — Z8701 Personal history of pneumonia (recurrent): Secondary | ICD-10-CM | POA: Insufficient documentation

## 2012-10-24 LAB — COMPREHENSIVE METABOLIC PANEL
ALT: 14 U/L (ref 0–35)
Albumin: 2.4 g/dL — ABNORMAL LOW (ref 3.5–5.2)
Alkaline Phosphatase: 85 U/L (ref 39–117)
Calcium: 8.9 mg/dL (ref 8.4–10.5)
Potassium: 3.5 mEq/L (ref 3.5–5.1)
Sodium: 136 mEq/L (ref 135–145)
Total Protein: 6.8 g/dL (ref 6.0–8.3)

## 2012-10-24 LAB — CBC
MCH: 28.7 pg (ref 26.0–34.0)
MCHC: 33.9 g/dL (ref 30.0–36.0)
Platelets: 386 10*3/uL (ref 150–400)
RDW: 14.2 % (ref 11.5–15.5)

## 2012-10-24 LAB — URINALYSIS, ROUTINE W REFLEX MICROSCOPIC
Glucose, UA: NEGATIVE mg/dL
Ketones, ur: 15 mg/dL — AB
Leukocytes, UA: NEGATIVE
Specific Gravity, Urine: 1.022 (ref 1.005–1.030)
pH: 5.5 (ref 5.0–8.0)

## 2012-10-24 MED ORDER — ONDANSETRON HCL 4 MG/2ML IJ SOLN
4.0000 mg | Freq: Once | INTRAMUSCULAR | Status: AC
  Administered 2012-10-24: 4 mg via INTRAVENOUS
  Filled 2012-10-24: qty 2

## 2012-10-24 MED ORDER — MORPHINE SULFATE 4 MG/ML IJ SOLN
4.0000 mg | Freq: Once | INTRAMUSCULAR | Status: AC
  Administered 2012-10-24: 4 mg via INTRAVENOUS
  Filled 2012-10-24: qty 1

## 2012-10-24 NOTE — ED Notes (Signed)
Placed call to [facility] for transport back home 

## 2012-10-24 NOTE — ED Provider Notes (Addendum)
History     CSN: [id number]  Arrival date & time 10/24/12  1659   First MD Initiated Contact with Patient 10/24/12 1709      Chief Complaint  Patient presents with  . Back Pain  . Fall    (Consider location/radiation/quality/duration/timing/severity/associated sxs/prior treatment) Patient is a 72 y.o. female presenting with back pain and fall. The history is provided by the patient.  Back Pain Associated symptoms: no abdominal pain, no chest pain, no fever and no headaches   Fall Pertinent negatives include no fever, no abdominal pain, no vomiting and no headaches.  pt c/o fall 5/15 while at [facility] on respite care stay. Mechanical fall, stumbled, fell back. No loc. C/o diffuse back pain since fall. Returned home from [facility] yesterday. States cant move or get around due to pain. States has not been ambulatory since fall. States has chronic 'lung pain' due to 'severe bronchiectasis', is on hospice care, has visiting nurse. States pain not controlled on her home meds. Denies radicular pain. No numbness/weakness. Denies headache or nv. No neck pain. Denies other pain/injury.   Past Medical History  Diagnosis Date  . Hypothyroidism   . History of allergy   . Mycobacterium avium complex   . Bronchiectasis     colonized mrsa, s/p vancomyin IV 10 days 5/10 and 7 days 6/10  . Pneumonia   . Headache     Past Surgical History  Procedure Laterality Date  . Cataract extraction, bilateral    . Abdominal hysterectomy    . Tonsillectomy      t and a    No family history on file.  History  Substance Use Topics  . Smoking status: Former Smoker -- 1.00 packs/day for 3 years    Types: Cigarettes    Quit date: 06/08/1962  . Smokeless tobacco: Never Used  . Alcohol Use: No    OB History   Grav Para Term Preterm Abortions TAB SAB Ect Mult Living                  Review of Systems  Constitutional: Negative for fever and chills.  HENT: Negative for neck pain.   Eyes: Negative for  visual disturbance.  Respiratory: Negative for cough and shortness of breath.   Cardiovascular: Negative for chest pain.  Gastrointestinal: Negative for vomiting and abdominal pain.  Genitourinary: Negative for flank pain.  Musculoskeletal: Positive for back pain.  Skin: Negative for wound.  Neurological: Negative for headaches.  Hematological: Does not bruise/bleed easily.  Psychiatric/Behavioral: Negative for confusion.    Allergies  Ambien; Doxycycline; Levofloxacin; Mometasone furoate; Penicillins; and Sulfonamide derivatives  Home Medications   Current Outpatient Rx  Name  Route  Sig  Dispense  Refill  . albuterol (PROVENTIL) (2.5 MG/3ML) 0.083% nebulizer solution   Nebulization   Take 3 mLs (2.5 mg total) by nebulization every 6 (six) hours as needed.   360 mL   5     Hospice pt.   . albuterol (VENTOLIN HFA) 108 (90 BASE) MCG/ACT inhaler   Inhalation   Inhale 2 puffs into the lungs every 6 (six) hours as needed.   1 Inhaler   3     HOSPICE PT   . ALPRAZolam (XANAX) 0.5 MG tablet   Oral   Take 1 tablet (0.5 mg total) by mouth 4 (four) times daily as needed for anxiety.   120 tablet   0   . aluminum & magnesium hydroxide-simethicone (MYLANTA) 500-450-40 MG/5ML suspension   Oral  Take 5 mLs by mouth as needed for indigestion.   360 mL   3   . citalopram (CELEXA) 40 MG tablet   Oral   Take 1 tablet (40 mg total) by mouth daily.   30 tablet   6   . fluticasone (FLONASE) 50 MCG/ACT nasal spray      2 sprays each nostril daily-hospice   16 g   0   . gabapentin (NEURONTIN) 300 MG capsule   Oral   Take 1 capsule (300 mg total) by mouth 3 (three) times daily.   90 capsule   6   . HYDROcodone-homatropine (HYCODAN) 5-1.5 MG/5ML syrup   Oral   Take 10 mLs by mouth 2 (two) times daily.   480 mL   0   . levothyroxine (SYNTHROID, LEVOTHROID) 50 MCG tablet   Oral   Take 1 tablet (50 mcg total) by mouth daily before breakfast.   30 tablet   6   .  loperamide (IMODIUM) 2 MG capsule   Oral   Take 1 capsule (2 mg total) by mouth as needed. For diarrhea relief after each loose stool.   30 capsule   3   . Oxycodone HCl 10 MG TABS      One to two every 4 hours as needed for pain   90 tablet   0   . phenazopyridine (PYRIDIUM) 100 MG tablet   Oral   Take 100 mg by mouth 3 (three) times daily as needed.         . prochlorperazine (COMPAZINE) 10 MG tablet   Oral   Take 1 tablet (10 mg total) by mouth every 6 (six) hours as needed.   30 tablet   6   . traZODone (DESYREL) 150 MG tablet   Oral   Take 1 tablet (150 mg total) by mouth at bedtime.   30 tablet   2     Hospice pt   . triamcinolone (KENALOG) 0.025 % cream      Apply to area three times daily as needed   30 g   1     Hospice patient   . triamterene-hydrochlorothiazide (MAXZIDE) 75-50 MG per tablet   Oral   Take 1 tablet by mouth every other day.   30 tablet   2     BP 124/54  Pulse 74  Temp(Src) 98.4 F (36.9 C) (Oral)  Resp 16  SpO2 100%  Physical Exam  Nursing note and vitals reviewed. Constitutional: She is oriented to person, place, and time. She appears well-developed and well-nourished. No distress.  HENT:  Head: Atraumatic.  Eyes: Conjunctivae are normal. Pupils are equal, round, and reactive to light. No scleral icterus.  Neck: Neck supple. No tracheal deviation present.  Cardiovascular: Normal rate, regular rhythm, normal heart sounds and intact distal pulses.   Pulmonary/Chest: Effort normal and breath sounds normal. No respiratory distress. She exhibits no tenderness.  Abdominal: Soft. Normal appearance and bowel sounds are normal. She exhibits no distension. There is no tenderness.  Musculoskeletal: She exhibits no edema and no tenderness.  c spine non tender, good rom. Diffuse tls spine tenderness and paraspinal tenderness. Spine aligned, no step off.  Good rom bil ext without focal pain or bony tenderness.   Neurological: She is  alert and oriented to person, place, and time.  Motor intact bil.   Skin: Skin is warm and dry. No rash noted.  Psychiatric: She has a normal mood and affect.    ED Course  Procedures (including critical care time)  Results for orders placed during the hospital encounter of 10/24/12  CBC      Result Value Range   WBC 14.4 (*) 4.0 - 10.5 K/uL   RBC 3.90  3.87 - 5.11 MIL/uL   Hemoglobin 11.2 (*) 12.0 - 15.0 g/dL   HCT 16.1 (*) 09.6 - 04.5 %   MCV 84.6  78.0 - 100.0 fL   MCH 28.7  26.0 - 34.0 pg   MCHC 33.9  30.0 - 36.0 g/dL   RDW 40.9  81.1 - 91.4 %   Platelets 386  150 - 400 K/uL  COMPREHENSIVE METABOLIC PANEL      Result Value Range   Sodium 136  135 - 145 mEq/L   Potassium 3.5  3.5 - 5.1 mEq/L   Chloride 94 (*) 96 - 112 mEq/L   CO2 28  19 - 32 mEq/L   Glucose, Bld 90  70 - 99 mg/dL   BUN 20  6 - 23 mg/dL   Creatinine, Ser 7.82  0.50 - 1.10 mg/dL   Calcium 8.9  8.4 - 95.6 mg/dL   Total Protein 6.8  6.0 - 8.3 g/dL   Albumin 2.4 (*) 3.5 - 5.2 g/dL   AST 47 (*) 0 - 37 U/L   ALT 14  0 - 35 U/L   Alkaline Phosphatase 85  39 - 117 U/L   Total Bilirubin 0.3  0.3 - 1.2 mg/dL   GFR calc non Af Amer 58 (*) >90 mL/min   GFR calc Af Amer 68 (*) >90 mL/min   Dg Chest 2 View  10/24/2012   *RADIOLOGY REPORT*  Clinical Data: Upper and lower back pain following a fall.  CHEST - 2 VIEW  Comparison: 10/05/2012.  Findings: Interval 1.3 cm mass-like density in the left upper lung zone.  Mild increase in inhomogeneous prominence of the interstitial markings, accentuated by a decreased inspiration. Stable calcified granuloma at the left lung base.  Old, healed left six seventh and eighth rib fractures.  No acute fracture pneumothorax seen.  Stable mild scoliosis and diffuse osteopenia.  IMPRESSION:  1.  Interval small, poorly defined nodular density in the left upper lung zone.  This could be further evaluated with a chest CT with contrast at this time or follow-up PA and lateral chest radiographs  in 6-8 weeks. 2.  Probably no significant change in chronic interstitial lung disease when a decreased inspiration is taken into account. 3.  No acute fracture or pneumothorax.   Original Report Authenticated By: Beckie Salts, M.D.   Dg Chest 2 View  10/05/2012   *RADIOLOGY REPORT*  Clinical Data: Cough, shortness of breath and bronchiectasis.  CHEST - 2 VIEW  Comparison: 10/23/2011  Findings: Stable appearance of chronic lung disease and left lower lung calcified granuloma.  No definite focal infiltrate, edema or pneumothorax is identified.  No significant pleural effusions. Heart size is within normal limits.  The bony thorax shows stable osteopenia.  IMPRESSION: Stable chronic lung disease.  No acute infiltrate identified.   Original Report Authenticated By: Irish Lack, M.D.   Dg Thoracic Spine 2 View  10/24/2012   *RADIOLOGY REPORT*  Clinical Data: Upper and lower back pain following a fall today.  THORACIC SPINE - 2 VIEW  Comparison: Chest radiographs dated 10/05/2012.  Findings: Stable diffuse osteopenia and mild scoliosis.  No fracture or subluxation.  Cervical spine degenerative changes.  IMPRESSION: No fracture or subluxation.   Original Report Authenticated By: Beckie Salts, M.D.  Dg Lumbar Spine Complete  10/24/2012   *RADIOLOGY REPORT*  Clinical Data: Upper and lower back pain following a fall today.  LUMBAR SPINE - COMPLETE 4+ VIEW  Comparison: None.  Findings: Five non-rib bearing lumbar vertebrae.  Mild levoconvex scoliosis.  No fractures, pars defects or subluxations.  IMPRESSION: No fracture or subluxation.   Original Report Authenticated By: Beckie Salts, M.D.       MDM  Iv ns. Morphine iv. zofran iv.  Reviewed nursing notes and prior charts for additional history.   Recheck pain improved. Signed out to oncoming provider to check ua, recheck pt and dispo appropriately.  Has hospice care at home, so anticipate probable d/c.   Recheck pain improved but persists. Morphine iv.        Suzi Roots, MD 10/24/12 1958  Suzi Roots, MD 10/24/12 2026

## 2012-10-24 NOTE — ED Provider Notes (Signed)
Patient care assumed from Dr. Denton Lank at shift change. UA pending with plan to dispo if without evidence of infection; patient on hospice care.  Urine without evidence of infection; leukocytes and nitrites. Plan to d/c home where patient's symptoms can be further managed under hospice care. Patient endorses adequate management of pain and is without any new complaints. Will arrange transport home via [facility].   Results for orders placed during the hospital encounter of 10/24/12  CBC      Result Value Range   WBC 14.4 (*) 4.0 - 10.5 K/uL   RBC 3.90  3.87 - 5.11 MIL/uL   Hemoglobin 11.2 (*) 12.0 - 15.0 g/dL   HCT 16.1 (*) 09.6 - 04.5 %   MCV 84.6  78.0 - 100.0 fL   MCH 28.7  26.0 - 34.0 pg   MCHC 33.9  30.0 - 36.0 g/dL   RDW 40.9  81.1 - 91.4 %   Platelets 386  150 - 400 K/uL  COMPREHENSIVE METABOLIC PANEL      Result Value Range   Sodium 136  135 - 145 mEq/L   Potassium 3.5  3.5 - 5.1 mEq/L   Chloride 94 (*) 96 - 112 mEq/L   CO2 28  19 - 32 mEq/L   Glucose, Bld 90  70 - 99 mg/dL   BUN 20  6 - 23 mg/dL   Creatinine, Ser 7.82  0.50 - 1.10 mg/dL   Calcium 8.9  8.4 - 95.6 mg/dL   Total Protein 6.8  6.0 - 8.3 g/dL   Albumin 2.4 (*) 3.5 - 5.2 g/dL   AST 47 (*) 0 - 37 U/L   ALT 14  0 - 35 U/L   Alkaline Phosphatase 85  39 - 117 U/L   Total Bilirubin 0.3  0.3 - 1.2 mg/dL   GFR calc non Af Amer 58 (*) >90 mL/min   GFR calc Af Amer 68 (*) >90 mL/min  URINALYSIS, ROUTINE W REFLEX MICROSCOPIC      Result Value Range   Color, Urine YELLOW  YELLOW   APPearance CLEAR  CLEAR   Specific Gravity, Urine 1.022  1.005 - 1.030   pH 5.5  5.0 - 8.0   Glucose, UA NEGATIVE  NEGATIVE mg/dL   Hgb urine dipstick NEGATIVE  NEGATIVE   Bilirubin Urine SMALL (*) NEGATIVE   Ketones, ur 15 (*) NEGATIVE mg/dL   Protein, ur NEGATIVE  NEGATIVE mg/dL   Urobilinogen, UA 0.2  0.0 - 1.0 mg/dL   Nitrite NEGATIVE  NEGATIVE   Leukocytes, UA NEGATIVE  NEGATIVE   Dg Chest 2 View  10/24/2012   *RADIOLOGY REPORT*   Clinical Data: Upper and lower back pain following a fall.  CHEST - 2 VIEW  Comparison: 10/05/2012.  Findings: Interval 1.3 cm mass-like density in the left upper lung zone.  Mild increase in inhomogeneous prominence of the interstitial markings, accentuated by a decreased inspiration. Stable calcified granuloma at the left lung base.  Old, healed left six seventh and eighth rib fractures.  No acute fracture pneumothorax seen.  Stable mild scoliosis and diffuse osteopenia.  IMPRESSION:  1.  Interval small, poorly defined nodular density in the left upper lung zone.  This could be further evaluated with a chest CT with contrast at this time or follow-up PA and lateral chest radiographs in 6-8 weeks. 2.  Probably no significant change in chronic interstitial lung disease when a decreased inspiration is taken into account. 3.  No acute fracture or pneumothorax.  Original Report Authenticated By: Beckie Salts, M.D.   Dg Thoracic Spine 2 View  10/24/2012   *RADIOLOGY REPORT*  Clinical Data: Upper and lower back pain following a fall today.  THORACIC SPINE - 2 VIEW  Comparison: Chest radiographs dated 10/05/2012.  Findings: Stable diffuse osteopenia and mild scoliosis.  No fracture or subluxation.  Cervical spine degenerative changes.  IMPRESSION: No fracture or subluxation.   Original Report Authenticated By: Beckie Salts, M.D.   Dg Lumbar Spine Complete  10/24/2012   *RADIOLOGY REPORT*  Clinical Data: Upper and lower back pain following a fall today.  LUMBAR SPINE - COMPLETE 4+ VIEW  Comparison: None.  Findings: Five non-rib bearing lumbar vertebrae.  Mild levoconvex scoliosis.  No fractures, pars defects or subluxations.  IMPRESSION: No fracture or subluxation.   Original Report Authenticated By: Beckie Salts, M.D.      Antony Madura, PA-C 10/29/12 1245

## 2012-10-24 NOTE — Telephone Encounter (Signed)
I will forward this message to PW so that he can be aware of this information.

## 2012-10-24 NOTE — ED Notes (Addendum)
[phone] Hospice. Spoke to hospice. Calling for update. Magda Paganini RN to be made aware.

## 2012-10-24 NOTE — ED Notes (Signed)
Pt was at hospice care on 10-20-12 and was found on floor. Pt was D/C home on Friday. Pt has reported sever pain all over her body and was sent by her Home health Nurse to be evaluated. On arrival to Ed Pt A/O.

## 2012-10-25 NOTE — Progress Notes (Signed)
Room [facility] ED CDU 4 - Pierre Bali -  [name] RN Visit-R.Beckie Viscardi RN  Related admission to [facility] diagnosis of ES COPD.  Pt is DNR code.    Pt alert & oriented, just arrived to ED, lying on EMS backboard and stretcher, with complaints of severe generalized pain.    No family present.  Patient's home medication list is on shadow chart.   Will follow up with ED work-up for [facility] team.   Please call [name] @ [phone]- ask for RN Liaison or after hours,ask for on-call RN with any hospice needs.   Thank you.  Joneen Boers, RN  [facility]  Hospice Liaison

## 2012-10-25 NOTE — Telephone Encounter (Signed)
Ok noted  

## 2012-11-16 ENCOUNTER — Telehealth: Payer: Self-pay | Admitting: Critical Care Medicine

## 2012-11-16 NOTE — Telephone Encounter (Signed)
D/C neurontin

## 2012-11-16 NOTE — Telephone Encounter (Signed)
I spoke with Brooke [name]. She stated pt stated the neurotin does nothing for her. Could not tell it helped in any way. Brooke [name] is wanting order to d.c this. I advised her [name] is currently out of the office and will return next week. She was fine with this. Please advise [name] thanks

## 2012-11-17 ENCOUNTER — Telehealth: Payer: Self-pay | Admitting: Critical Care Medicine

## 2012-11-17 MED ORDER — CLARITHROMYCIN 500 MG PO TABS: 500.0000 mg | ORAL_TABLET | Freq: Two times a day (BID) | ORAL | Status: DC

## 2012-11-17 NOTE — Telephone Encounter (Signed)
I have left a detailed msg with CDY's recs and advised the nurse to call back if needed Rx was sent to [facility] for biaxin

## 2012-11-17 NOTE — Telephone Encounter (Signed)
LMTCB for Brooke [name]

## 2012-11-17 NOTE — Telephone Encounter (Signed)
i spoke with Florentina Addison and we will forward to dr. Maple Hudson for recs. Please advise Dr. Maple Hudson thanks  Allergies  Allergen Reactions  . Ambien (Zolpidem Tartrate)     Sleep walking and altered mental status  . Doxycycline     vomiting  . Levofloxacin Other (See Comments)    Unknown rxn.  Tolerates Cipro  . Mometasone Furoate Other (See Comments)     nasonex causes swollen throat, thrush  . Penicillins Other (See Comments)    Unknown reaction noted on hospice paperwork  . Sulfonamide Derivatives Other (See Comments)    Unknown reaction noted on hospice paperwork

## 2012-11-17 NOTE — Telephone Encounter (Signed)
Brooke [name] calling again says she has a little more info to share with nurse can be reached at [phone].Brooke [name]

## 2012-11-17 NOTE — Telephone Encounter (Signed)
Per CY-okay to give Biaxin 500 mg #14 1 po BID no refills and decrease Maxzide 75/50 to 1/2 tablet QOD.

## 2012-11-17 NOTE — Telephone Encounter (Signed)
Left detailed message on Brooke [name] VM. Advised tcb if she had any questions. Will sign off message.

## 2012-11-17 NOTE — Telephone Encounter (Signed)
I called and made terese aware. Nothing further was needed

## 2012-11-17 NOTE — Telephone Encounter (Signed)
Spoke with [employer] , pt's Hospice RN She states that the pt is having increased cough x 2 days Cough is prod with minimal medium green sputum She is taking hydromet 5 ml QID with no relief [name] is also concerned about her BP being too low- reading earlier today was 86/54 Pt taking maxzide 75/50 mg 1 tablets every other day  Should we decrease to 1/2 every other day?  Please advise recs re BP and cough thanks! Allergies  Allergen Reactions  . Ambien (Zolpidem Tartrate)     Sleep walking and altered mental status  . Doxycycline     vomiting  . Levofloxacin Other (See Comments)    Unknown rxn.  Tolerates Cipro  . Mometasone Furoate Other (See Comments)     nasonex causes swollen throat, thrush  . Penicillins Other (See Comments)    Unknown reaction noted on hospice paperwork  . Sulfonamide Derivatives Other (See Comments)    Unknown reaction noted on hospice paperwork

## 2012-11-17 NOTE — Telephone Encounter (Signed)
Per CY-pt can try Pepto bismol, Kaopectate, Activa yogurt or Florastar.

## 2012-11-17 NOTE — Telephone Encounter (Signed)
lmomtcb x1 for therese.

## 2012-11-29 ENCOUNTER — Telehealth: Payer: Self-pay | Admitting: Critical Care Medicine

## 2012-11-29 NOTE — Telephone Encounter (Signed)
Spoke with [facility] with Hospice and pt c/o elevated temps ranging from 99 to 100.1.  PT states that she normally runs 96 to 97 and she feels uncomfortable when she is at 99.  Nurse states that all other symtoms are the same .  No new changes.  Recently finished Biaxin.  Pt wanted to make PW aware and see if he recommends anything new for the fever symptom.  Please advise.

## 2012-11-29 NOTE — Telephone Encounter (Signed)
I spoke with Brooke [name] and made her aware of this. She voiced her understanding and needed nothing further

## 2012-11-29 NOTE — Telephone Encounter (Signed)
Administer tylenol or motrin as needed per hospice protocols that I signed already months ago

## 2012-12-20 ENCOUNTER — Telehealth: Payer: Self-pay | Admitting: Critical Care Medicine

## 2012-12-20 NOTE — Telephone Encounter (Signed)
Called spoke with [name] who reports that pt has been hypotensive since [name] has been working with her (normally running 90-110 systolic), but the last 2 visits to pt's home she has been symptomatic.  Standing, BP drops to 70/50 > pt felt woozy, and didn't fall but did "kind of crumple into a little puddle."  [name] stated that pt is "really pale" today and skin is very dry.  Pt is drinking Gatorade and drinks this well, and does reduce her Triamterene to 1/2 tab every 3rd day.  BP at rest is 92/60 and pulse is good.  Pt typically eats salad and/or rice pudding with very little-to-no red meats to help with iron consumption.  Dr Delford Field, do you have any recommendations for pt?  Thank you.

## 2012-12-20 NOTE — Telephone Encounter (Signed)
Brooke [name] returned call Advised of recommendations as stated by PW below Brooke [name] verbalized her understanding and will advise pt Nothing further needed at this time; will sign off.

## 2012-12-20 NOTE — Telephone Encounter (Signed)
LMTCB X1 for [employer]

## 2012-12-20 NOTE — Telephone Encounter (Signed)
Push fluids and stop trimaterene

## 2012-12-27 ENCOUNTER — Telehealth: Payer: Self-pay | Admitting: Critical Care Medicine

## 2012-12-27 NOTE — Telephone Encounter (Signed)
Spoke with [name] She is calling to give update on the pt She states that pt reports feeling like there is fluid build up in her abdomen [name] does not feel like there is any swelling anywhere though She was taken off of maxzide due to low bp, which still remains low Pt requesting however to resume med to decrease fluid in her belly [name] states pt not in any distress and this msg can be answered 12/28/12 Please advise, thanks!

## 2012-12-28 ENCOUNTER — Other Ambulatory Visit: Payer: Self-pay | Admitting: Critical Care Medicine

## 2012-12-28 MED ORDER — DIPHENOXYLATE-ATROPINE 2.5-0.025 MG PO TABS: 1.0000 | ORAL_TABLET | Freq: Four times a day (QID) | ORAL | Status: DC | PRN

## 2012-12-28 NOTE — Telephone Encounter (Signed)
Spoke with Brooke [name] and notified of recs per PW She verbalized understanding  Pt now c/o watery diarrhea She states immodium not helping to the pt's satisfaction  Pt requesting something else-? Florastor or Activia  Please advise, thanks

## 2012-12-28 NOTE — Telephone Encounter (Signed)
Spoke with the [name] and notified of recs per PW She verbalized understanding and states nothing further needed Rx was sent to [facility]

## 2012-12-28 NOTE — Telephone Encounter (Signed)
rx lomotil 1 every 6 hour as needed loose stool

## 2012-12-28 NOTE — Telephone Encounter (Signed)
Resumption of maxzide is not adviseable, this is due to natural process of progressive respiratory failure Use usual hospice means for comfort

## 2012-12-30 ENCOUNTER — Telehealth: Payer: Self-pay | Admitting: Critical Care Medicine

## 2012-12-30 NOTE — Telephone Encounter (Signed)
Called and spoke with teress with hospice.  She stated that the pt may want to pay out of pocket for the lomotil,  And i called the pharmacy and the price for this medication is $52.00.  This medication is not on the formulary and they have not been able to get this approved.  They decline this medication due to 2 things  1.  Non formulary 2.  Not suggested for pts over the age of 28 to use.  teress wanted to call and make PW aware.  Pt has been using [employer] probiotic daily, immodium, 2 activia daily.  Pt stated that she cannot afford the $52.00.  teress stated that she is not sure that the pt is not using the immodium like she has been instructed.  teress stated that she will talk with Dr. Gibson Ramp to see if there is another alternative to the lomotil.  PW please advise of any other recs.  They are aware that PW is out until Monday.   Allergies  Allergen Reactions  . Ambien (Zolpidem Tartrate)     Sleep walking and altered mental status  . Doxycycline     vomiting  . Levofloxacin Other (See Comments)    Unknown rxn.  Tolerates Cipro  . Mometasone Furoate Other (See Comments)     nasonex causes swollen throat, thrush  . Penicillins Other (See Comments)    Unknown reaction noted on hospice paperwork  . Sulfonamide Derivatives Other (See Comments)    Unknown reaction noted on hospice paperwork

## 2012-12-31 NOTE — Telephone Encounter (Signed)
No other options recommended

## 2013-01-02 NOTE — Telephone Encounter (Signed)
I spoke with Brooke [name] and made her aware of PW recs. Nothing further was needed

## 2013-01-02 NOTE — Telephone Encounter (Signed)
LMTCB X1 for Brooke [name]

## 2013-01-03 ENCOUNTER — Telehealth: Payer: Self-pay | Admitting: Critical Care Medicine

## 2013-01-03 MED ORDER — HYDROCODONE-HOMATROPINE 5-1.5 MG/5ML PO SYRP: 10.0000 mL | ORAL_SOLUTION | Freq: Two times a day (BID) | ORAL | Status: DC

## 2013-01-03 NOTE — Telephone Encounter (Signed)
i am ok with hydromet refill  I do not wish to do any stool studies

## 2013-01-03 NOTE — Telephone Encounter (Signed)
I spoke with [name] and she states the pt wanted to know if [name] would like to order stool testing since she has been having diarrhea x 3 weeks and nothing has helped. [name] states the pt stools are a yellow color and have developed a very foul odor. [name] is aware PW will be back in office tomorrow and is ok with waiting. Please advise.   Also [name] is requesting to have a refill for hydromet. I have printed this and had MW, sign and fax to [facility]. [name] is aware. Carron Curie, CMA

## 2013-01-04 NOTE — Telephone Encounter (Signed)
Notified [name] that hydromet was filled an no stool studies was required at this time. [name] verbalized understanding.  Nothing further was needed. June Leap Lilly

## 2013-01-04 NOTE — Telephone Encounter (Signed)
Refill has been sent to pharmacy. I LMTCBx1 to advise Brooke [name] no studies needed. Carron Curie, CMA

## 2013-01-24 ENCOUNTER — Other Ambulatory Visit: Payer: Self-pay | Admitting: *Deleted

## 2013-01-24 MED ORDER — CITALOPRAM HYDROBROMIDE 40 MG PO TABS: 40.0000 mg | ORAL_TABLET | Freq: Every day | ORAL | Status: DC

## 2013-01-26 ENCOUNTER — Other Ambulatory Visit: Payer: Self-pay | Admitting: *Deleted

## 2013-01-26 MED ORDER — HYDROCODONE-HOMATROPINE 5-1.5 MG/5ML PO SYRP: 10.0000 mL | ORAL_SOLUTION | Freq: Two times a day (BID) | ORAL | Status: DC

## 2013-01-26 NOTE — Telephone Encounter (Signed)
Received faxed refill request for hydromet from [facility]. Spoke with PW.  Ok to refill hydromet.  Rx printed, signed by PW, and faxed to [facility].

## 2013-02-10 ENCOUNTER — Telehealth: Payer: Self-pay | Admitting: Critical Care Medicine

## 2013-02-10 MED ORDER — HYDROCODONE-HOMATROPINE 5-1.5 MG/5ML PO SYRP: 10.0000 mL | ORAL_SOLUTION | Freq: Two times a day (BID) | ORAL | Status: DC

## 2013-02-10 NOTE — Telephone Encounter (Signed)
Yes, Ok to refill 

## 2013-02-10 NOTE — Telephone Encounter (Signed)
I called [facility] and gave verbal order for the cough syrup.  I called hospice to speak with Maralyn Sago. Received her named VM and left detailed message advising this has been.

## 2013-02-10 NOTE — Telephone Encounter (Signed)
Hydromet last refilled 01/26/13 #480 ml x 0 refills. Please advise [name] if okay to refill thanks

## 2013-03-01 ENCOUNTER — Telehealth: Payer: Self-pay | Admitting: Critical Care Medicine

## 2013-03-01 MED ORDER — HYDROCODONE-HOMATROPINE 5-1.5 MG/5ML PO SYRP: 10.0000 mL | ORAL_SOLUTION | Freq: Two times a day (BID) | ORAL | Status: DC

## 2013-03-01 NOTE — Telephone Encounter (Signed)
i am ok with the refill requested

## 2013-03-01 NOTE — Telephone Encounter (Signed)
Spoke with Brooke [name] She is requesting refill on pt's hydromet Also FYI- Dr Mikle Bosworth increased trazodone to 150 mg 1 1/2 tablet at bedtime   Please advise on refill thanks

## 2013-03-01 NOTE — Telephone Encounter (Signed)
Refill was Called to [employer] aware

## 2013-03-16 ENCOUNTER — Telehealth: Payer: Self-pay | Admitting: Critical Care Medicine

## 2013-03-16 NOTE — Telephone Encounter (Signed)
Calling to inform PW that Dr. Gibson Ramp started patient on mucinex ER 60mg  once every 12 hrs as needed. I will forward as an FYI. Carron Curie, CMA

## 2013-03-16 NOTE — Telephone Encounter (Signed)
noted 

## 2013-03-22 ENCOUNTER — Telehealth: Payer: Self-pay | Admitting: Critical Care Medicine

## 2013-03-22 NOTE — Telephone Encounter (Signed)
Pt had hydromet was last refilled 03/01/13 #480 ml x 0 refills. I advised her PW was out this afternoon but back in tomorrow. She stated she will ask the hospice physician to refill this instead bc she is out. She did not need this sent to the doc of the day. Will sign off message

## 2013-04-03 ENCOUNTER — Encounter: Payer: Self-pay | Admitting: *Deleted

## 2013-04-03 ENCOUNTER — Telehealth: Payer: Self-pay | Admitting: Critical Care Medicine

## 2013-04-03 NOTE — Telephone Encounter (Signed)
i am ok with writing this letter

## 2013-04-03 NOTE — Telephone Encounter (Signed)
Pt lives on the 2nd floor of her building and she is not able to walk down the stairs to get her mail. She is needing a letter stating this from [name] so the mail carrier will deliver her mail to her door. Please advise if ok to do letter. Carron Curie, CMA

## 2013-04-03 NOTE — Telephone Encounter (Signed)
Letter completed. Per pt, this needs to be faxed to Attn: Post Master at [phone]. This has been done.   Pt aware.

## 2013-04-13 ENCOUNTER — Telehealth: Payer: Self-pay | Admitting: Critical Care Medicine

## 2013-04-13 MED ORDER — HYDROCODONE-HOMATROPINE 5-1.5 MG/5ML PO SYRP: 10.0000 mL | ORAL_SOLUTION | Freq: Two times a day (BID) | ORAL | Status: DC

## 2013-04-13 NOTE — Telephone Encounter (Signed)
Brooke [name] from Hospice notified that rx for Hydromet was faxed to [facility] pharmacy

## 2013-04-13 NOTE — Telephone Encounter (Signed)
Rx printed and left for PW to sign.  Will fax to [facility] when complete.

## 2013-04-13 NOTE — Telephone Encounter (Signed)
I spoke with Jaymes Graff. She reports pt received refill on hydromet 03/22/13 by the hospice doc #480 ML. Pt received refill from [place] on 03/01/13 #480 ML. Pt is almost out needing refill.  FYI for [name] also that on 03/29/13 Dr. Garlan Fillers gave pt RX for oxycodone #180. At that time pt already had 15 tabs in the home of this medication. As of yesterday pt only had #79 left. Pt reports she is taking this 1 Q4HRS at times will take 2. Of course they have concerns as to why this is going through so fast but Dr. Garlan Fillers stated we will continue to monitor for now. Pt daughter is in the process of moving out now but it is just 1 street away. Please advise [name] regarding refill thanks

## 2013-04-13 NOTE — Telephone Encounter (Signed)
Usually this is related to daughter using meds Ok and noted

## 2013-04-19 ENCOUNTER — Telehealth: Payer: Self-pay | Admitting: Critical Care Medicine

## 2013-04-19 NOTE — Telephone Encounter (Signed)
Patient has been having increased diarrhea; has tried OTC Imodium but seems to not work well for patient. Pt is requesting to have verbal okay from PW to take Floragen (OTC probiotic) daily. Her pharmacy can order this for her. Per KC (doc of day) okay to allow patient to have OTC Probiotic once daily. Will forward to PW so he is aware.   [name] (hospice nurse) is aware-gave verbal order.

## 2013-04-21 ENCOUNTER — Telehealth: Payer: Self-pay | Admitting: Critical Care Medicine

## 2013-04-21 NOTE — Telephone Encounter (Signed)
Suggest comfort care ,  No other good suggestions ... sorry

## 2013-04-21 NOTE — Telephone Encounter (Signed)
I spoke with pt. Aware of recs. I advised I will call Jaymes Graff and make her aware as well. I spoke with Jaymes Graff and is aware. Nothing further needed.

## 2013-04-21 NOTE — Telephone Encounter (Signed)
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 Has an open wound on her left palm, on her bottom and her ear. Does not want to be sent to a Dermatologist for this. She has a Kenalog cream listed on her medication that she has used in the past for this.  I called and spoke with her hospice nurse Brooke [name]. She states that she has discussed this with the pt and advised her to contact our office. According to the med list that Brooke [name] has, Brooke [name] has Kenalog and Nystatin creams for her psoriasis. Neither cream is working per the pt.  PW - is there anything else we can do for this pt? Thanks.

## 2013-04-21 NOTE — Telephone Encounter (Signed)
lmomtcb x1 for pt 

## 2013-04-21 NOTE — Telephone Encounter (Signed)
Patient calling back about the same.   °

## 2013-05-08 ENCOUNTER — Telehealth: Payer: Self-pay | Admitting: Critical Care Medicine

## 2013-05-08 NOTE — Telephone Encounter (Signed)
I called and spoke with pt. She is asking to have the letter done for her on 04/03/13 to be faxed to the # above and mailed to her (confirmed mailing address). i have done so. Nothing further needed

## 2013-05-09 ENCOUNTER — Telehealth: Payer: Self-pay | Admitting: Critical Care Medicine

## 2013-05-09 NOTE — Telephone Encounter (Signed)
2.  [name] from hospice spoke w/ Dr. Gibson Ramp who saw pt yesterday & is requesting to have PW write a Rx Oxycondone 10 mg tablet, 1-2 tablets every 4 hours as needed for pain-total of 140 tablets.  MUST WRITE HOSPICE PT.  Pharmacy is [facility].  Jaymes Graff can be reached at [phone].  Jeanice Lim D Pryor]

## 2013-05-09 NOTE — Telephone Encounter (Signed)
I called [name] to confirn that they took car of the rx for the pt. Hospice NP did so, nothing further needed.Carron Curie, CMA

## 2013-05-09 NOTE — Telephone Encounter (Signed)
Brooke [name] with hospice called back & states that their nurse is taking care of her oxycodone as well as her hydromet Rx.  Brooke [name]

## 2013-06-12 ENCOUNTER — Telehealth: Payer: Self-pay | Admitting: *Deleted

## 2013-06-12 ENCOUNTER — Telehealth: Payer: Self-pay | Admitting: Critical Care Medicine

## 2013-06-12 DIAGNOSIS — A310 Pulmonary mycobacterial infection: Secondary | ICD-10-CM

## 2013-06-12 DIAGNOSIS — J9610 Chronic respiratory failure, unspecified whether with hypoxia or hypercapnia: Secondary | ICD-10-CM

## 2013-06-12 DIAGNOSIS — J479 Bronchiectasis, uncomplicated: Secondary | ICD-10-CM

## 2013-06-12 NOTE — Telephone Encounter (Signed)
Message copied by Tommie SamsSILVA, Aryanne Gilleland S on Mon Jun 12, 2013  3:19 PM ------      Message from: Henderson NewcomerSTENSON, MELISSA      Created: Mon Jun 12, 2013 12:57 PM       Hey Tiquan Bouch.  This pt was started on O2 while she was with Hospice.  She was not on O2 prior to Hospice so we will need to treat this as a new O2 order.  She has Medicare so she will need to come in for an office visit to discuss chronic respiratory dx , symptoms and need for O2.  We'll need qualifying sats as well as an order with liter flow, frequency, etc.       Let me know if you have any questions.        Thanks      ----- Message -----         From: Tommie SamsMindy S Silva, CMA         Sent: 06/12/2013  12:12 PM           To: Melissa Stenson            Order placed on pt thanks       ------

## 2013-06-12 NOTE — Telephone Encounter (Signed)
I spoke with Brooke [name] she will be d/c from hospice. She reports [facility] will need to continue [facility] at home. I advised will do so. Nothing further needed

## 2013-06-13 ENCOUNTER — Telehealth: Payer: Self-pay | Admitting: *Deleted

## 2013-06-13 NOTE — Telephone Encounter (Signed)
Spoke with Melissa with [facility].  They will not d/c pt's o2 from home right now as they are contracted with hospice and supply hospice o2.  However, Efraim KaufmannMelissa is unsure if pt will be billed for o2 from now until pending OV on Jul 05, 2013 given no qualifying sats at this time.  She will check on this and will call back.

## 2013-06-13 NOTE — Telephone Encounter (Signed)
Per PW:  Pt will need OV with him in January.  Called, spoke with pt.  We have scheduled this for Jan 28 at 10:30 am.  Pt is aware.  Per pt, this is the soonest she can come in d/t finances.  Pt states [facility] will need order, so she can keep her o2.  [facility]  Melissa sent staff msg to another nurse -- see phone msg from 06/12/13 for further information -- pt will need OV with qualifying o2 sats and order with liter flow as o2 was started while pt was with hospice.  Pt would like to ensure she will be able to keep o2 prior to coming in for OV -- pt states she cannot afford to pay for it.  I have lmomtcb for Melissa with [facility] to discuss this further.  Pt aware I will call her back with an update.

## 2013-06-14 NOTE — Telephone Encounter (Signed)
I spoke with Brooke [name]. She reports they investigated and pt insurance changed to [employer] and [facility] is not in network for DME with them. However they will keep her O2 in the home that she currently has until she comes in for OV jan 28. Then at that time she will need a new O2 referral to [facility]. [facility] is not going to charge pt for this and doing the right thing for the pt. I advised will let Crystal and Dr. Delford FieldWright know this as well.

## 2013-06-14 NOTE — Telephone Encounter (Signed)
noted 

## 2013-06-14 NOTE — Telephone Encounter (Signed)
Melissa w/ [facility] is calling to speak w/ Crystal.  Can be reached at [phone].  Antionette FairyHolly D Pryor

## 2013-06-15 NOTE — Telephone Encounter (Signed)
Called, spoke with pt to inform her of [facility].  Pt is already aware of this - states [facility] called her and advised of same.  She is aware to keep Jan 28 OV and call back if anything further is needed.

## 2013-06-22 ENCOUNTER — Telehealth: Payer: Self-pay | Admitting: Critical Care Medicine

## 2013-06-22 NOTE — Telephone Encounter (Signed)
Please advise on refill. Thanks. 

## 2013-06-22 NOTE — Telephone Encounter (Signed)
Ok to refill 

## 2013-06-23 MED ORDER — TRAZODONE HCL 150 MG PO TABS: 150.0000 mg | ORAL_TABLET | Freq: Every day | ORAL | Status: DC

## 2013-06-23 NOTE — Telephone Encounter (Signed)
Pt is aware refill has been sent. Nothing further needed

## 2013-06-30 ENCOUNTER — Telehealth: Payer: Self-pay | Admitting: Critical Care Medicine

## 2013-06-30 MED ORDER — MOXIFLOXACIN HCL 400 MG PO TABS: 400.0000 mg | ORAL_TABLET | Freq: Every day | ORAL | Status: DC

## 2013-06-30 NOTE — Telephone Encounter (Signed)
I spoke with pt. She reports she was exposed to pertussis. Her daughter was dx with this and was tested-had high markers for pertussis 2 days ago. Pt reports she has not had a vaccine for pertussis. Pt c/o runny nose, prod cough- green phlem at times, wheezing, chest tx x 1 week. Denies any body aches, no fever, no chills, no sweats. Not taking anything for cough. Pt wants to know if she needs to do anything. Pt has pending appt next week with PW. Please advise thanks  Allergies  Allergen Reactions  . Ambien [Zolpidem Tartrate]     Sleep walking and altered mental status  . Doxycycline     vomiting  . Levofloxacin Other (See Comments)    Unknown rxn.  Tolerates Cipro  . Mometasone Furoate Other (See Comments)     nasonex causes swollen throat, thrush  . Penicillins Other (See Comments)    Unknown reaction noted on hospice paperwork  . Sulfonamide Derivatives Other (See Comments)    Unknown reaction noted on hospice paperwork

## 2013-06-30 NOTE — Telephone Encounter (Signed)
I called and spoke with pt. She is aware of recs. RX has been sent. Pt reports she has taken this medication several times and is fine with taking it. Nothing further needed

## 2013-06-30 NOTE — Telephone Encounter (Signed)
rx avelox 400mg  per day x 5 days

## 2013-07-05 ENCOUNTER — Telehealth: Payer: Self-pay | Admitting: Critical Care Medicine

## 2013-07-05 ENCOUNTER — Ambulatory Visit (INDEPENDENT_AMBULATORY_CARE_PROVIDER_SITE_OTHER): Payer: Medicare HMO | Admitting: Critical Care Medicine

## 2013-07-05 ENCOUNTER — Ambulatory Visit (INDEPENDENT_AMBULATORY_CARE_PROVIDER_SITE_OTHER)
Admission: RE | Admit: 2013-07-05 | Discharge: 2013-07-05 | Disposition: A | Discharge status: Home / Self Care | Payer: Medicare Other | Source: Ambulatory Visit | Attending: Critical Care Medicine | Admitting: Critical Care Medicine

## 2013-07-05 ENCOUNTER — Encounter: Payer: Self-pay | Admitting: Critical Care Medicine

## 2013-07-05 ENCOUNTER — Ambulatory Visit: Payer: Medicare HMO

## 2013-07-05 VITALS — BP 84/54 | HR 71 | Temp 98.1°F | Ht 64.5 in | Wt 105.0 lb

## 2013-07-05 DIAGNOSIS — E039 Hypothyroidism, unspecified: Secondary | ICD-10-CM

## 2013-07-05 DIAGNOSIS — Z515 Encounter for palliative care: Secondary | ICD-10-CM

## 2013-07-05 DIAGNOSIS — R197 Diarrhea, unspecified: Secondary | ICD-10-CM

## 2013-07-05 DIAGNOSIS — J9610 Chronic respiratory failure, unspecified whether with hypoxia or hypercapnia: Secondary | ICD-10-CM

## 2013-07-05 DIAGNOSIS — J479 Bronchiectasis, uncomplicated: Secondary | ICD-10-CM

## 2013-07-05 DIAGNOSIS — E440 Moderate protein-calorie malnutrition: Secondary | ICD-10-CM

## 2013-07-05 LAB — COMPREHENSIVE METABOLIC PANEL
ALK PHOS: 112 U/L (ref 39–117)
ALT: 10 U/L (ref 0–35)
AST: 18 U/L (ref 0–37)
Albumin: 3 g/dL — ABNORMAL LOW (ref 3.5–5.2)
BILIRUBIN TOTAL: 0.2 mg/dL — AB (ref 0.3–1.2)
BUN: 7 mg/dL (ref 6–23)
CO2: 30 mEq/L (ref 19–32)
Calcium: 8.6 mg/dL (ref 8.4–10.5)
Chloride: 102 mEq/L (ref 96–112)
Creatinine, Ser: 1 mg/dL (ref 0.4–1.2)
GFR: 56.79 mL/min — ABNORMAL LOW (ref >60.00)
GLUCOSE: 79 mg/dL (ref 70–99)
Potassium: 4.4 mEq/L (ref 3.5–5.1)
SODIUM: 137 meq/L (ref 135–145)
TOTAL PROTEIN: 6.9 g/dL (ref 6.0–8.3)

## 2013-07-05 LAB — CBC WITH DIFFERENTIAL/PLATELET
BASOS PCT: 0.5 % (ref 0.0–3.0)
Basophils Absolute: 0 10*3/uL (ref 0.0–0.1)
EOS PCT: 8.1 % — AB (ref 0.0–5.0)
Eosinophils Absolute: 0.7 10*3/uL (ref 0.0–0.7)
HCT: 38.3 % (ref 36.0–46.0)
Hemoglobin: 12.2 g/dL (ref 12.0–15.0)
LYMPHS ABS: 1.9 10*3/uL (ref 0.7–4.0)
Lymphocytes Relative: 20.8 % (ref 12.0–46.0)
MCHC: 31.8 g/dL (ref 30.0–36.0)
MCV: 87.8 fl (ref 78.0–100.0)
MONO ABS: 0.6 10*3/uL (ref 0.1–1.0)
Monocytes Relative: 6.4 % (ref 3.0–12.0)
Neutro Abs: 5.7 10*3/uL (ref 1.4–7.7)
Neutrophils Relative %: 64.2 % (ref 43.0–77.0)
PLATELETS: 418 10*3/uL — AB (ref 150.0–400.0)
RBC: 4.36 Mil/uL (ref 3.87–5.11)
RDW: 16.1 % — ABNORMAL HIGH (ref 11.5–14.6)
WBC: 8.9 10*3/uL (ref 4.5–10.5)

## 2013-07-05 MED ORDER — METRONIDAZOLE 250 MG PO TABS: 250.0000 mg | ORAL_TABLET | Freq: Three times a day (TID) | ORAL | Status: DC

## 2013-07-05 MED ORDER — OXYCODONE HCL 10 MG PO TABS: 10.0000 mg | ORAL_TABLET | ORAL | Status: DC | PRN

## 2013-07-05 MED ORDER — CHOLESTYRAMINE LIGHT 4 G PO PACK: 4.0000 g | PACK | Freq: Two times a day (BID) | ORAL | Status: DC

## 2013-07-05 MED ORDER — HYDROCODONE-HOMATROPINE 5-1.5 MG/5ML PO SYRP: 10.0000 mL | ORAL_SOLUTION | Freq: Two times a day (BID) | ORAL | Status: DC

## 2013-07-05 MED ORDER — ALPRAZOLAM 0.5 MG PO TABS: 0.5000 mg | ORAL_TABLET | Freq: Four times a day (QID) | ORAL | Status: DC | PRN

## 2013-07-05 NOTE — Telephone Encounter (Signed)
Called spoke with patient. Per pt the Hydromet is not covered by her insurance and would cost her $64.  Pt is unable to afford this.  Her daughter has the same insurance that she does and there is another prescription cough syrup that insurance does cover.  Her daughter has gone home to get the name of this medication.  Pt requesting to call the office back with this information.  PW is admin this afternoon. Will await pt's return call.

## 2013-07-05 NOTE — Progress Notes (Addendum)
Subjective:    Patient ID: Brooke [name], female    DOB: 1941/03/05, 73 y.o.   MRN: [id number]  HPI  73 y.o..WF with Bronchiectasis. MRSA colonization  07/05/2013 Chief Complaint  Patient presents with  . Recurrent Pneumonia    Breathing is unchanged. Reports SOB, chest tightness, coughing with production of green mucus, wheezing and diarrhea. Had been running a fever of  99.8.  Pt with chronic pain, not able to breathe.   Hurts all over. Notes rib pain. If tries to cough hurts all over.   Mucus comes up, not daily.  occ gets up some mucus.  Remains green.  Notes severe diarrhea.   Weight unchanged  The pt has been in hospice since 10/2011.  Recently Hospice d/c services d/t thinking pt had improved, gained 20#, driving her self, no longer homebound, not using oxygen.   However Pt has not been driving, has not gained 78#, only 5#, remains orthostatic and severely malnourished in severe chronic pain   Past Medical History  Diagnosis Date  . Hypothyroidism   . History of allergy   . Mycobacterium avium complex   . Bronchiectasis     colonized mrsa, s/p vancomyin IV 10 days 5/10 and 7 days 6/10  . Pneumonia   . Headache(784.0)      No family history on file.   History   Social History  . Marital Status: Widowed    Spouse Name: N/A    Number of Children: N/A  . Years of Education: N/A   Occupational History  . Not on file.   Social History Main Topics  . Smoking status: Former Smoker -- 1.00 packs/day for 3 years    Types: Cigarettes    Quit date: 06/08/1962  . Smokeless tobacco: Never Used  . Alcohol Use: No  . Drug Use: No  . Sexual Activity: Not on file   Other Topics Concern  . Not on file   Social History Narrative  . No narrative on file     Allergies  Allergen Reactions  . Ambien [Zolpidem Tartrate]     Sleep walking and altered mental status  . Doxycycline     vomiting  . Levofloxacin Other (See Comments)    Unknown rxn.  Tolerates Cipro  .  Mometasone Furoate Other (See Comments)     nasonex causes swollen throat, thrush  . Penicillins Other (See Comments)    Unknown reaction noted on hospice paperwork  . Sulfonamide Derivatives Other (See Comments)    Unknown reaction noted on hospice paperwork     Outpatient Prescriptions Prior to Visit  Medication Sig Dispense Refill  . albuterol (PROVENTIL) (2.5 MG/3ML) 0.083% nebulizer solution Take 2.5 mg by nebulization every 6 (six) hours as needed (respiratory distress).      Marland Kitchen albuterol (VENTOLIN HFA) 108 (90 BASE) MCG/ACT inhaler Inhale 2 puffs into the lungs every 6 (six) hours as needed.  1 Inhaler  3  . citalopram (CELEXA) 40 MG tablet Take 1 tablet (40 mg total) by mouth daily.  30 tablet  6  . diphenoxylate-atropine (LOMOTIL) 2.5-0.025 MG per tablet Take 1 tablet by mouth every 6 (six) hours as needed for diarrhea or loose stools.  30 tablet  0  . fluticasone (FLONASE) 50 MCG/ACT nasal spray 2 sprays each nostril daily-hospice  16 g  0  . levothyroxine (SYNTHROID, LEVOTHROID) 100 MCG tablet Take 50 mcg by mouth daily before breakfast.      . loperamide (IMODIUM A-D) 2 MG tablet Take  2 mg by mouth as needed for diarrhea or loose stools.      . prochlorperazine (COMPAZINE) 10 MG tablet Take 10 mg by mouth every 6 (six) hours as needed (nausea).      . senna (SENOKOT) 8.6 MG TABS Take 1 tablet by mouth daily as needed (constipation).      . traZODone (DESYREL) 150 MG tablet Take 1 tablet (150 mg total) by mouth at bedtime.  30 tablet  2  . triamcinolone (KENALOG) 0.025 % cream Apply 1 application topically 3 (three) times daily as needed (psoriasis).      . ALPRAZolam (XANAX) 0.5 MG tablet Take 1 tablet (0.5 mg total) by mouth 4 (four) times daily as needed for anxiety.  120 tablet  0  . HYDROcodone-homatropine (HYDROMET) 5-1.5 MG/5ML syrup Take 10 mLs by mouth 2 (two) times daily.  480 mL  0  . Oxycodone HCl 10 MG TABS Take 10-20 mg by mouth every 4 (four) hours as needed (pain).       . triamterene-hydrochlorothiazide (MAXZIDE) 75-50 MG per tablet Take 1 tablet by mouth every other day.  30 tablet  2  . clarithromycin (BIAXIN) 500 MG tablet Take 1 tablet (500 mg total) by mouth 2 (two) times daily.  14 tablet  0  . moxifloxacin (AVELOX) 400 MG tablet Take 1 tablet (400 mg total) by mouth daily.  5 tablet  0   No facility-administered medications prior to visit.      Review of Systems  Constitutional:   No  weight loss, night sweats,  Fevers, chills, fatigue, lassitude. HEENT:   No headaches,  Difficulty swallowing,  Tooth/dental problems,  Sore throat,                No sneezing, itching, ear ache,  +nasal congestion, post nasal drip,   CV:  Notes  chest pain,  Orthopnea, PND, swelling in lower extremities, anasarca, dizziness, palpitations  GI  No heartburn, indigestion, abdominal pain,  nausea,    +++diarrhea, +++change in bowel habits, +++loss of appetite  Resp: Notes  shortness of breath with exertion and rest.    No coughing up of blood.    No chest wall deformity  Skin: no rash or lesions.  GU: no dysuria, change in color of urine, no urgency or frequency.  No flank pain.  MS:  Diffuse body pain  Psych:  No change in mood or affect. No depression or anxiety.  No memory loss.     Objective:   Physical Exam   BP 84/54  Pulse 71  Temp(Src) 98.1 F (36.7 C) (Oral)  Ht 5' 4.5" (1.638 m)  Wt 105 lb (47.628 kg)  BMI 17.75 kg/m2  SpO2 91%   Gen: Thin and cachectic white female  ENT: No lesions,  mouth clear,  oropharynx clear,   Neck: No JVD, no TMG, no carotid bruits  Lungs: No use of accessory muscles, no dullness to percussion, coarse BS w/ few rhonchi esp RLL.  Cardiovascular: RRR, heart sounds normal, no murmur or gallops, no peripheral edema  Abdomen: soft and NT, no HSM,  BS normal  Musculoskeletal: No deformities, no cyanosis or clubbing  Neuro: alert, non focal  Skin: Warm, no lesions or rashes    Cleda Daub 07/04/2013: FeV1 42%  Severe obstruction/ restriction FVC 41%  No desaturation with ambulation  Recent Labs Lab 07/05/13 1249  HGB 12.2  HCT 38.3  WBC 8.9  PLT 418.0*    Recent Labs Lab 07/05/13 1249  NA  137  K 4.4  CL 102  CO2 30  GLUCOSE 79  BUN 7  CREATININE 1.0  CALCIUM 8.6    Recent Labs Lab 07/05/13 1249  AST 18  ALT 10  ALKPHOS 112  BILITOT 0.2*  PROT 6.9  ALBUMIN 3.0*   Dg Chest 2 View  07/05/2013   CLINICAL DATA:  Cough, fever, shortness of breath, chest pain, history of COPD  EXAM: CHEST  2 VIEW  COMPARISON:  DG THORACIC SPINE dated 10/24/2012; DG CHEST 2 VIEW dated 10/24/2012  FINDINGS: The cardiac silhouette is moderately enlarged. The lungs are inflated. And there is flattening of the hemidiaphragms. Diffuse prominence of interstitial markings is appreciated stable when compared to previous study. Areas of bronchiectasis within the lung bases. Calcified granuloma projects within the periphery of the left lung base. No focal region of consolidation appreciated. Mild areas increased density projects within the lung bases appear is blunting of the costophrenic angles. The osseous structures are unremarkable.  IMPRESSION: 1. COPD 2. Chronic interstitial changes. 3. Calcified granuloma left lung base. 4. Atelectasis versus infiltrate versus regions of confluent scarring in the lung bases. 5. Trace bilateral effusions versus regions of scarring.   Electronically Signed   By: Salome HolmesHector  Cooper M.D.   On: 07/05/2013 13:01    Assessment & Plan:   Diarrhea Chronic diarrhea ?etiology Pt too ill/weak to evaluate Plan Trial flagyl and questran Cont probiotics  Protein-calorie malnutrition, moderate Moderate prot cal malnutrition. Albumen 3.0   Hospice says gained 20# on their service, however according to my records she was 100# on adm and is now 105#.   Plan Cont nutritional support  Chronic respiratory failure Chronic resp failure oxygen dependent Not able to assess oxygen on  exertion, BP fell 90>>80 Systolic with ambulation Pt wheelchair bound Get ONO on RA to requalify for oxygen This patient continues to use and benefit from her oxygen therapy and has re qualified at this visit for continued oxygen therapy This pt has never gained 20# under hospice care.  Adm weight was 100#  She is now 105#.  She is orthostatic, weak, wheelchair bound and cannot go out of her home even to MD office visits. She has refused SNF placement.   Plan Refer back to hospice for home care.      BRONCHIECTASIS Ongoing bronchiectasis with progressive decline in lung function FeV1 now 30 % predicted down from 42% in 2011  Continues to decline  Refer back to hospice home care.   TSH normal. No hypothyroidism  Updated Medication List Outpatient Encounter Prescriptions as of 07/05/2013  Medication Sig  . albuterol (PROVENTIL) (2.5 MG/3ML) 0.083% nebulizer solution Take 2.5 mg by nebulization every 6 (six) hours as needed (respiratory distress).  Marland Kitchen. albuterol (VENTOLIN HFA) 108 (90 BASE) MCG/ACT inhaler Inhale 2 puffs into the lungs every 6 (six) hours as needed.  . ALPRAZolam (XANAX) 0.5 MG tablet Take 1 tablet (0.5 mg total) by mouth 4 (four) times daily as needed for anxiety.  . citalopram (CELEXA) 40 MG tablet Take 1 tablet (40 mg total) by mouth daily.  . diphenoxylate-atropine (LOMOTIL) 2.5-0.025 MG per tablet Take 1 tablet by mouth every 6 (six) hours as needed for diarrhea or loose stools.  . fluticasone (FLONASE) 50 MCG/ACT nasal spray 2 sprays each nostril daily-hospice  . levothyroxine (SYNTHROID, LEVOTHROID) 100 MCG tablet Take 50 mcg by mouth daily before breakfast.  . loperamide (IMODIUM A-D) 2 MG tablet Take 2 mg by mouth as needed for diarrhea or loose  stools.  . Oxycodone HCl 10 MG TABS Take 1-2 tablets (10-20 mg total) by mouth every 4 (four) hours as needed (pain).  . prochlorperazine (COMPAZINE) 10 MG tablet Take 10 mg by mouth every 6 (six) hours as needed  (nausea).  . senna (SENOKOT) 8.6 MG TABS Take 1 tablet by mouth daily as needed (constipation).  . traZODone (DESYREL) 150 MG tablet Take 1 tablet (150 mg total) by mouth at bedtime.  . triamcinolone (KENALOG) 0.025 % cream Apply 1 application topically 3 (three) times daily as needed (psoriasis).  . [DISCONTINUED] ALPRAZolam (XANAX) 0.5 MG tablet Take 1 tablet (0.5 mg total) by mouth 4 (four) times daily as needed for anxiety.  . [DISCONTINUED] ALPRAZolam (XANAX) 0.5 MG tablet Take 1 tablet (0.5 mg total) by mouth 4 (four) times daily as needed for anxiety.  . [DISCONTINUED] HYDROcodone-homatropine (HYDROMET) 5-1.5 MG/5ML syrup Take 10 mLs by mouth 2 (two) times daily.  . [DISCONTINUED] HYDROcodone-homatropine (HYDROMET) 5-1.5 MG/5ML syrup Take 10 mLs by mouth 2 (two) times daily.  . [DISCONTINUED] Oxycodone HCl 10 MG TABS Take 10-20 mg by mouth every 4 (four) hours as needed (pain).  . [DISCONTINUED] triamterene-hydrochlorothiazide (MAXZIDE) 75-50 MG per tablet Take 1 tablet by mouth every other day.  . cholestyramine light (PREVALITE) 4 G packet Take 1 packet (4 g total) by mouth 2 (two) times daily.  . metroNIDAZOLE (FLAGYL) 250 MG tablet Take 1 tablet (250 mg total) by mouth 3 (three) times daily.  . [DISCONTINUED] clarithromycin (BIAXIN) 500 MG tablet Take 1 tablet (500 mg total) by mouth 2 (two) times daily.  . [DISCONTINUED] moxifloxacin (AVELOX) 400 MG tablet Take 1 tablet (400 mg total) by mouth daily.         Updated Medication List Outpatient Encounter Prescriptions as of 07/05/2013  Medication Sig  . albuterol (PROVENTIL) (2.5 MG/3ML) 0.083% nebulizer solution Take 2.5 mg by nebulization every 6 (six) hours as needed (respiratory distress).  Marland Kitchen albuterol (VENTOLIN HFA) 108 (90 BASE) MCG/ACT inhaler Inhale 2 puffs into the lungs every 6 (six) hours as needed.  . ALPRAZolam (XANAX) 0.5 MG tablet Take 1 tablet (0.5 mg total) by mouth 4 (four) times daily as needed for anxiety.  .  citalopram (CELEXA) 40 MG tablet Take 1 tablet (40 mg total) by mouth daily.  . diphenoxylate-atropine (LOMOTIL) 2.5-0.025 MG per tablet Take 1 tablet by mouth every 6 (six) hours as needed for diarrhea or loose stools.  . fluticasone (FLONASE) 50 MCG/ACT nasal spray 2 sprays each nostril daily-hospice  . levothyroxine (SYNTHROID, LEVOTHROID) 100 MCG tablet Take 50 mcg by mouth daily before breakfast.  . loperamide (IMODIUM A-D) 2 MG tablet Take 2 mg by mouth as needed for diarrhea or loose stools.  . Oxycodone HCl 10 MG TABS Take 1-2 tablets (10-20 mg total) by mouth every 4 (four) hours as needed (pain).  . prochlorperazine (COMPAZINE) 10 MG tablet Take 10 mg by mouth every 6 (six) hours as needed (nausea).  . senna (SENOKOT) 8.6 MG TABS Take 1 tablet by mouth daily as needed (constipation).  . traZODone (DESYREL) 150 MG tablet Take 1 tablet (150 mg total) by mouth at bedtime.  . triamcinolone (KENALOG) 0.025 % cream Apply 1 application topically 3 (three) times daily as needed (psoriasis).  . [DISCONTINUED] ALPRAZolam (XANAX) 0.5 MG tablet Take 1 tablet (0.5 mg total) by mouth 4 (four) times daily as needed for anxiety.  . [DISCONTINUED] ALPRAZolam (XANAX) 0.5 MG tablet Take 1 tablet (0.5 mg total) by mouth 4 (four)  times daily as needed for anxiety.  . [DISCONTINUED] HYDROcodone-homatropine (HYDROMET) 5-1.5 MG/5ML syrup Take 10 mLs by mouth 2 (two) times daily.  . [DISCONTINUED] HYDROcodone-homatropine (HYDROMET) 5-1.5 MG/5ML syrup Take 10 mLs by mouth 2 (two) times daily.  . [DISCONTINUED] Oxycodone HCl 10 MG TABS Take 10-20 mg by mouth every 4 (four) hours as needed (pain).  . [DISCONTINUED] triamterene-hydrochlorothiazide (MAXZIDE) 75-50 MG per tablet Take 1 tablet by mouth every other day.  . cholestyramine light (PREVALITE) 4 G packet Take 1 packet (4 g total) by mouth 2 (two) times daily.  . metroNIDAZOLE (FLAGYL) 250 MG tablet Take 1 tablet (250 mg total) by mouth 3 (three) times daily.   . [DISCONTINUED] clarithromycin (BIAXIN) 500 MG tablet Take 1 tablet (500 mg total) by mouth 2 (two) times daily.  . [DISCONTINUED] moxifloxacin (AVELOX) 400 MG tablet Take 1 tablet (400 mg total) by mouth daily.

## 2013-07-05 NOTE — Telephone Encounter (Signed)
Pt is requesting an alternative to Hydromet cough syrup Rxd syrup is going to cost pt $65  Requesting CheraTussin cough syrup--she knows that this is covered and is inexpensive. Allergies  Allergen Reactions  . Ambien [Zolpidem Tartrate]     Sleep walking and altered mental status  . Doxycycline     vomiting  . Levofloxacin Other (See Comments)    Unknown rxn.  Tolerates Cipro  . Mometasone Furoate Other (See Comments)     nasonex causes swollen throat, thrush  . Penicillins Other (See Comments)    Unknown reaction noted on hospice paperwork  . Sulfonamide Derivatives Other (See Comments)    Unknown reaction noted on hospice paperwork   Please advise Dr Delford FieldWright if okay to substitute and if so, what dose?  Pt okay with waiting until tomorrow for a response as she know Dr Delford FieldWright is not back in the office until tomorrow morning. Please advise PW. Thanks.

## 2013-07-05 NOTE — Patient Instructions (Signed)
Labs and xray today Refills on xanax, pain med, cough syrup Use Questran daily Take flagyl three times daily Lomotil as needed No other changes I will advocate for you to get into hospice again

## 2013-07-06 DIAGNOSIS — Z515 Encounter for palliative care: Secondary | ICD-10-CM | POA: Insufficient documentation

## 2013-07-06 LAB — THYROID PANEL WITH TSH
Free Thyroxine Index: 2.1 (ref 1.0–3.9)
T3 UPTAKE: 35.2 % (ref 22.5–37.0)
T4 TOTAL: 6 ug/dL (ref 5.0–12.5)
TSH: 3.039 u[IU]/mL (ref 0.350–4.500)

## 2013-07-06 MED ORDER — GUAIFENESIN-CODEINE 100-10 MG/5ML PO SYRP: 5.0000 mL | ORAL_SOLUTION | Freq: Four times a day (QID) | ORAL | Status: DC | PRN

## 2013-07-06 NOTE — Telephone Encounter (Signed)
i am ok with chera tussin  Also please refer this pt back to [name] to reaccept as a patient, I will send 1/28 OV note to them Tell referral clerk I spoke to Ms Brooke GulaSusan [name] and she said they would reconsider her case

## 2013-07-06 NOTE — Progress Notes (Signed)
Quick Note:  Call pt and tell her labs are ok, No change in medications. Thyroid function normal ______

## 2013-07-06 NOTE — Progress Notes (Signed)
Quick Note:  Called, spoke with pt. Informed her of lab results and recs per Dr. Wright. She verbalized understanding. ______ 

## 2013-07-06 NOTE — Assessment & Plan Note (Addendum)
Chronic resp failure oxygen dependent Not able to assess oxygen on exertion, BP fell 90>>80 Systolic with ambulation Pt wheelchair bound Get ONO on RA to requalify for oxygen This patient continues to use and benefit from her oxygen therapy and has re qualified at this visit for continued oxygen therapy This pt has never gained 20# under hospice care.  Adm weight was 100#  She is now 105#.  She is orthostatic, weak, wheelchair bound and cannot go out of her home even to MD office visits. She has refused SNF placement.   Plan Refer back to hospice for home care.

## 2013-07-06 NOTE — Telephone Encounter (Signed)
I called Cheratussin into [facility].  Pt aware and is aware we are going to work on getting her back into hospice.  She verbalized understanding and was very thankful for our help.  I have placed hospice referral and spoke with Bjorn LoserRhonda about this.

## 2013-07-06 NOTE — Telephone Encounter (Signed)
I called and spoke with pt. She reports the cough syrup has already been called in. Nothing further needed

## 2013-07-06 NOTE — Assessment & Plan Note (Addendum)
Ongoing bronchiectasis with progressive decline in lung function FeV1 now 30 % predicted down from 42% in 2011  Continues to decline  Refer back to hospice home care.

## 2013-07-06 NOTE — Assessment & Plan Note (Signed)
Moderate prot cal malnutrition. Albumen 3.0   Hospice says gained 20# on their service, however according to my records she was 100# on adm and is now 105#.   Plan Cont nutritional support

## 2013-07-06 NOTE — Assessment & Plan Note (Signed)
Chronic diarrhea ?etiology Pt too ill/weak to evaluate Plan Trial flagyl and questran Cont probiotics

## 2013-07-11 ENCOUNTER — Telehealth: Payer: Self-pay | Admitting: Critical Care Medicine

## 2013-07-11 MED ORDER — GUAIFENESIN-CODEINE 100-10 MG/5ML PO SYRP: 5.0000 mL | ORAL_SOLUTION | Freq: Four times a day (QID) | ORAL | Status: DC | PRN

## 2013-07-11 NOTE — Telephone Encounter (Signed)
Refill ok per SN, called into pharmacy, spoke with pharmacists. Jaymes Grafferese is aware.  Carron CurieJennifer Mateusz Neilan, CMA

## 2013-07-11 NOTE — Telephone Encounter (Signed)
Hospice patient, PW patient. Pt was prescribed 120 ml of cherrtussin  5-10 ml every 6 hours as needed, on 07-06-13. [name], RN with hospice,  states the pt has 40 ml left as of yesterday so they are asking for a refill be sent to pharmacy. Please advise if ok to refill. Carron CurieJennifer Jaimy Kliethermes, CMA Allergies  Allergen Reactions  . Ambien [Zolpidem Tartrate]     Sleep walking and altered mental status  . Doxycycline     vomiting  . Levofloxacin Other (See Comments)    Unknown rxn.  Tolerates Cipro  . Mometasone Furoate Other (See Comments)     nasonex causes swollen throat, thrush  . Penicillins Other (See Comments)    Unknown reaction noted on hospice paperwork  . Sulfonamide Derivatives Other (See Comments)    Unknown reaction noted on hospice paperwork

## 2013-08-01 ENCOUNTER — Telehealth: Payer: Self-pay | Admitting: Critical Care Medicine

## 2013-08-01 MED ORDER — ALPRAZOLAM 0.5 MG PO TABS: 0.5000 mg | ORAL_TABLET | Freq: Four times a day (QID) | ORAL | Status: DC | PRN

## 2013-08-01 NOTE — Telephone Encounter (Signed)
Rx has been called in. Brooke [name] is aware.

## 2013-08-07 ENCOUNTER — Telehealth: Payer: Self-pay | Admitting: Critical Care Medicine

## 2013-08-07 MED ORDER — METRONIDAZOLE 500 MG PO TABS: 500.0000 mg | ORAL_TABLET | Freq: Three times a day (TID) | ORAL | Status: DC

## 2013-08-07 NOTE — Telephone Encounter (Signed)
HOSPICE PATIENT: Last OV 07-05-13. Pt states she has been having severe diarrhea x last 2 days. She has had this in the past and Dr. Delford FieldWright has prescribed flagyl 1 tablet tid and prevalite and this helped a lot, last prescribed on 07-05-13.  Pt is asking for a refill on these medications. She states she has become dehydrated in the past due to diarrhea and does not want this to happen.  Please advise. Carron CurieJennifer Sadiq Mccauley, CMA Allergies  Allergen Reactions  . Ambien [Zolpidem Tartrate]     Sleep walking and altered mental status  . Doxycycline     vomiting  . Levofloxacin Other (See Comments)    Unknown rxn.  Tolerates Cipro  . Mometasone Furoate Other (See Comments)     nasonex causes swollen throat, thrush  . Penicillins Other (See Comments)    Unknown reaction noted on hospice paperwork  . Sulfonamide Derivatives Other (See Comments)    Unknown reaction noted on hospice paperwork

## 2013-08-07 NOTE — Telephone Encounter (Signed)
Ok for flagyl 500 tid x 5ds Imodium 1-2 tabs as needed

## 2013-08-07 NOTE — Telephone Encounter (Signed)
Spoke with the pt and notified of recs per RA  She verbalized understanding  Rx was sent to pharm   

## 2013-08-21 ENCOUNTER — Other Ambulatory Visit: Payer: Self-pay | Admitting: *Deleted

## 2013-08-21 MED ORDER — METRONIDAZOLE 500 MG PO TABS: 500.0000 mg | ORAL_TABLET | Freq: Three times a day (TID) | ORAL | Status: DC

## 2013-08-21 NOTE — Telephone Encounter (Signed)
[facility] 500 mg # 15  Dr. Delford FieldWright signed requesting authorizing this refill plus 3 more refills.   I have faxed the request back to pharmacy and placed in scan folder.   Med list updated with this.

## 2013-08-24 ENCOUNTER — Telehealth: Payer: Self-pay | Admitting: Critical Care Medicine

## 2013-08-24 NOTE — Telephone Encounter (Signed)
i am ok with refill 

## 2013-08-24 NOTE — Telephone Encounter (Signed)
Last OV 07/05/2013 No pending OV  PW - please advise on refills. Thanks!

## 2013-08-25 MED ORDER — PROCHLORPERAZINE MALEATE 10 MG PO TABS: 10.0000 mg | ORAL_TABLET | Freq: Four times a day (QID) | ORAL | Status: DC | PRN

## 2013-08-25 MED ORDER — ALPRAZOLAM 0.5 MG PO TABS: 0.5000 mg | ORAL_TABLET | Freq: Four times a day (QID) | ORAL | Status: AC | PRN

## 2013-08-25 NOTE — Telephone Encounter (Signed)
RX's have been called in and hospice nurse is aware. Nothing further needed

## 2013-09-07 ENCOUNTER — Telehealth: Payer: Self-pay | Admitting: Critical Care Medicine

## 2013-09-07 MED ORDER — CHOLESTYRAMINE LIGHT 4 G PO PACK: 4.0000 g | PACK | Freq: Two times a day (BID) | ORAL | Status: DC

## 2013-09-07 NOTE — Telephone Encounter (Signed)
Pt is aware rx has been sent in. Nothing further is needed.

## 2013-09-14 ENCOUNTER — Telehealth: Payer: Self-pay | Admitting: Critical Care Medicine

## 2013-09-14 MED ORDER — TRAZODONE HCL 150 MG PO TABS: 150.0000 mg | ORAL_TABLET | Freq: Every day | ORAL | Status: DC

## 2013-09-14 NOTE — Telephone Encounter (Signed)
Ok to refill 

## 2013-09-14 NOTE — Telephone Encounter (Signed)
Brooke [name] is aware RX has been sent. Nothing further needed

## 2013-09-14 NOTE — Telephone Encounter (Signed)
Spoke with Nicole-hospice nurse. She reports pt needs refill on her trazodone. Last refilled 06/23/13 #30 x 2 refills. Please advise Dr. Delford FieldWright thanks

## 2013-10-09 ENCOUNTER — Other Ambulatory Visit: Payer: Self-pay | Admitting: Critical Care Medicine

## 2013-10-10 ENCOUNTER — Telehealth: Payer: Self-pay | Admitting: Critical Care Medicine

## 2013-10-10 MED ORDER — CIPROFLOXACIN HCL 500 MG PO TABS
500.0000 mg | ORAL_TABLET | Freq: Two times a day (BID) | ORAL | Status: DC
Start: 2013-10-10 — End: 2014-07-18

## 2013-10-10 NOTE — Telephone Encounter (Signed)
Spoke with pt- Pt states that she has asked Hospice to help her and they stated that they cannot help her and she needed to contact PW. C/o cystitis with a small fever. Requesting medication for pain or something to alleviate symptoms. Using OTC AZO--not working 100% Offered to let speak with Dr Maple HudsonYoung or one of our other physicians in office this afternoon to see if they have any recommendations or her, pt refused stating that she wants to wait until Dr Delford FieldWright available so that he can address this.  Pt aware that Dr Delford FieldWright not back in office until tomorrow morning--chooses to wait for him to address.  Please advise Dr Delford FieldWright. Thanks.  Allergies  Allergen Reactions  . Ambien [Zolpidem Tartrate]     Sleep walking and altered mental status  . Doxycycline     vomiting  . Levofloxacin Other (See Comments)    Unknown rxn.  Tolerates Cipro  . Mometasone Furoate Other (See Comments)     nasonex causes swollen throat, thrush  . Penicillins Other (See Comments)    Unknown reaction noted on hospice paperwork  . Sulfonamide Derivatives Other (See Comments)    Unknown reaction noted on hospice paperwork

## 2013-10-10 NOTE — Telephone Encounter (Signed)
Pt is aware of PW's recs. Rx has been sent in. Nothing further is needed.

## 2013-10-10 NOTE — Telephone Encounter (Signed)
Call in cipro 500mg  bid x 7days

## 2013-10-17 ENCOUNTER — Telehealth: Payer: Self-pay | Admitting: Critical Care Medicine

## 2013-10-17 MED ORDER — METRONIDAZOLE 500 MG PO TABS: 500.0000 mg | ORAL_TABLET | Freq: Three times a day (TID) | ORAL | Status: DC

## 2013-10-17 NOTE — Telephone Encounter (Signed)
Pt aware RX has been sent. Nothing further needed 

## 2013-11-07 ENCOUNTER — Telehealth: Payer: Self-pay | Admitting: Critical Care Medicine

## 2013-11-07 MED ORDER — PROCHLORPERAZINE MALEATE 10 MG PO TABS: 10.0000 mg | ORAL_TABLET | Freq: Four times a day (QID) | ORAL | Status: DC | PRN

## 2013-11-07 MED ORDER — LEVOTHYROXINE SODIUM 100 MCG PO TABS: 50.0000 ug | ORAL_TABLET | Freq: Every day | ORAL | Status: DC

## 2013-11-07 MED ORDER — CITALOPRAM HYDROBROMIDE 40 MG PO TABS: 40.0000 mg | ORAL_TABLET | Freq: Every day | ORAL | Status: DC

## 2013-11-07 NOTE — Telephone Encounter (Signed)
Agree 

## 2013-11-07 NOTE — Telephone Encounter (Signed)
Pt last had refill on celeva 40 mg 01/24/13 #30 x 6 refills Compazine 08/25/13 #30 x 1 refill Levothyroxine 10/05/12 #30 x 6 refills Please advise if okay to refill these medications [name] thanks

## 2013-11-07 NOTE — Telephone Encounter (Signed)
Rxs were refilled  Spoke with [name] and notified that this was done  She verbalized understanding and nothing further needed

## 2013-11-23 ENCOUNTER — Telehealth: Payer: Self-pay | Admitting: Critical Care Medicine

## 2013-11-23 MED ORDER — TRAZODONE HCL 150 MG PO TABS: 150.0000 mg | ORAL_TABLET | Freq: Every day | ORAL | Status: DC

## 2013-11-23 NOTE — Telephone Encounter (Signed)
Renee from hospice states pt needs a refill on her trazodone 150mg  tablets. Last rx written on 09-14-13 for #30 x 2 refills. Please advise if ok to refill. Thanks. Carron CurieJennifer Castillo, CMA Allergies  Allergen Reactions  . Ambien [Zolpidem Tartrate]     Sleep walking and altered mental status  . Doxycycline     vomiting  . Levofloxacin Other (See Comments)    Unknown rxn.  Tolerates Cipro  . Mometasone Furoate Other (See Comments)     nasonex causes swollen throat, thrush  . Penicillins Other (See Comments)    Unknown reaction noted on hospice paperwork  . Sulfonamide Derivatives Other (See Comments)    Unknown reaction noted on hospice paperwork

## 2013-11-23 NOTE — Telephone Encounter (Signed)
Called spoke with renee. Aware RX sent. Nothing further needed

## 2013-11-23 NOTE — Telephone Encounter (Signed)
i am ok for the refill

## 2013-11-30 ENCOUNTER — Telehealth: Payer: Self-pay | Admitting: Critical Care Medicine

## 2013-11-30 MED ORDER — OXYCODONE HCL 10 MG PO TABS: 10.0000 mg | ORAL_TABLET | ORAL | Status: DC | PRN

## 2013-11-30 NOTE — Telephone Encounter (Signed)
rx has been printed out and will have CY sign and fax to the pharmacy for hospice.

## 2013-11-30 NOTE — Telephone Encounter (Signed)
Ok to refill 

## 2013-11-30 NOTE — Telephone Encounter (Signed)
CY please advise if you would sign the rx for the pts oxycodone 10 mg  #180.  Pt is with hospice.  Thanks  Allergies  Allergen Reactions  . Ambien [Zolpidem Tartrate]     Sleep walking and altered mental status  . Doxycycline     vomiting  . Levofloxacin Other (See Comments)    Unknown rxn.  Tolerates Cipro  . Mometasone Furoate Other (See Comments)     nasonex causes swollen throat, thrush  . Penicillins Other (See Comments)    Unknown reaction noted on hospice paperwork  . Sulfonamide Derivatives Other (See Comments)    Unknown reaction noted on hospice paperwork    Current Outpatient Prescriptions on File Prior to Visit  Medication Sig Dispense Refill  . albuterol (PROVENTIL) (2.5 MG/3ML) 0.083% nebulizer solution Take 2.5 mg by nebulization every 6 (six) hours as needed (respiratory distress).      Marland Kitchen. albuterol (VENTOLIN HFA) 108 (90 BASE) MCG/ACT inhaler Inhale 2 puffs into the lungs every 6 (six) hours as needed.  1 Inhaler  3  . ALPRAZolam (XANAX) 0.5 MG tablet Take 1 tablet (0.5 mg total) by mouth 4 (four) times daily as needed for anxiety.  120 tablet  1  . cholestyramine light (PREVALITE) 4 G packet Take 1 packet (4 g total) by mouth 2 (two) times daily.  60 packet  5  . ciprofloxacin (CIPRO) 500 MG tablet Take 1 tablet (500 mg total) by mouth 2 (two) times daily.  14 tablet  0  . citalopram (CELEXA) 40 MG tablet Take 1 tablet (40 mg total) by mouth daily.  30 tablet  6  . diphenoxylate-atropine (LOMOTIL) 2.5-0.025 MG per tablet Take 1 tablet by mouth every 6 (six) hours as needed for diarrhea or loose stools.  30 tablet  0  . fluticasone (FLONASE) 50 MCG/ACT nasal spray 2 sprays each nostril daily-hospice  16 g  0  . guaiFENesin-codeine (CHERATUSSIN AC) 100-10 MG/5ML syrup Take 5-10 mLs by mouth every 6 (six) hours as needed for cough.  120 mL  0  . levothyroxine (SYNTHROID, LEVOTHROID) 100 MCG tablet Take 0.5 tablets (50 mcg total) by mouth daily before breakfast.  15 tablet   6  . loperamide (IMODIUM A-D) 2 MG tablet Take 2 mg by mouth as needed for diarrhea or loose stools.      . metroNIDAZOLE (FLAGYL) 250 MG tablet Take 1 tablet (250 mg total) by mouth 3 (three) times daily.  21 tablet  0  . metroNIDAZOLE (FLAGYL) 500 MG tablet Take 1 tablet (500 mg total) by mouth 3 (three) times daily.  15 tablet  3  . Oxycodone HCl 10 MG TABS Take 1-2 tablets (10-20 mg total) by mouth every 4 (four) hours as needed (pain).  120 tablet  0  . prochlorperazine (COMPAZINE) 10 MG tablet Take 1 tablet (10 mg total) by mouth every 6 (six) hours as needed (nausea).  30 tablet  1  . QC SENNA-S 8.6-50 MG per tablet TAKE 1 TO 5 TABLETS BY MOUTH ONE TO TWO TIMES DAILY AS NEEDED FOR CONSTIPATION  60 tablet  6  . senna (SENOKOT) 8.6 MG TABS Take 1 tablet by mouth daily as needed (constipation).      . traZODone (DESYREL) 150 MG tablet Take 1 tablet (150 mg total) by mouth at bedtime.  30 tablet  2  . triamcinolone (KENALOG) 0.025 % cream Apply 1 application topically 3 (three) times daily as needed (psoriasis).       No  current facility-administered medications on file prior to visit.

## 2013-11-30 NOTE — Telephone Encounter (Signed)
CY has signed rx. Per Joni ReiningNicole fax to [facility].[phone]. This has been done and nothing further needed

## 2013-12-05 ENCOUNTER — Telehealth: Payer: Self-pay | Admitting: Critical Care Medicine

## 2013-12-05 NOTE — Telephone Encounter (Signed)
Received handicap placard form from pt - requesting we complete it and [facility] to [facility] in addressed envelope provided by pt. Form was completed. It has been signed by Dr. Delford FieldWright. I have placed this is mail in the envelope provided by pt. Pt aware and voiced no further questions or concerns at this time.

## 2014-01-19 ENCOUNTER — Telehealth: Payer: Self-pay | Admitting: Critical Care Medicine

## 2014-01-19 MED ORDER — CIPROFLOXACIN HCL 750 MG PO TABS: 750.0000 mg | ORAL_TABLET | Freq: Two times a day (BID) | ORAL | Status: DC

## 2014-01-19 MED ORDER — PREDNISONE 10 MG PO TABS: ORAL_TABLET | ORAL | Status: DC

## 2014-01-19 NOTE — Telephone Encounter (Signed)
Spoke with [facility]-Hosp[facility] Nurse Aware of rec's per Dr Sherene SiresWert.  Verbal given for Rx's  Nothing further needed.

## 2014-01-19 NOTE — Telephone Encounter (Signed)
Called spoke with Tr[facility]-hospice nurse.  She reports pt is little more SOB this week and very fatigued, coughing up dark green phlem. resp rate is 38, O2 level 98% on 3.5 L/M PW on vacation Please advise MW thanks  Allergies  Allergen Reactions  . Ambien [Zolpidem Tartrate]     Sleep walking and altered mental status  . Doxycycline     vomiting  . Levofloxacin Other (See Comments)    Unknown rxn.  Tolerates Cipro  . Mometasone Furoate Other (See Comments)     nasonex causes swollen throat, thrush  . Penicillins Other (See Comments)    Unknown reaction noted on hospice paperwork  . Sulfonamide Derivatives Other (See Comments)    Unknown reaction noted on hospice paperwork

## 2014-01-19 NOTE — Telephone Encounter (Signed)
cipro 750 mg bid x 10 days (aware can't take fluoroquinolone)  Prednisone 10 mg take  4 each am x 2 days,   2 each am x 2 days,  1 each am x 2 days and stop

## 2014-02-01 NOTE — Telephone Encounter (Signed)
i agree with this rx

## 2014-02-13 ENCOUNTER — Telehealth: Payer: Self-pay | Admitting: Critical Care Medicine

## 2014-02-13 MED ORDER — METRONIDAZOLE 500 MG PO TABS: 500.0000 mg | ORAL_TABLET | Freq: Three times a day (TID) | ORAL | Status: DC

## 2014-02-13 NOTE — Telephone Encounter (Signed)
Called and spoke with [facility] from Hospice--  She stated that she just took over this pt and the pt told her today that she has been having diarrhea x 1 week.  She is having this even with drinking water.  Pt refused to have an appt.  Pt has been taking the prevalite 4 g BID and the lomotil every 6 hours PRN.  Pt is requesting to increase her meds.  PW please advise. Thanks  Allergies  Allergen Reactions  . Ambien [Zolpidem Tartrate]     Sleep walking and altered mental status  . Doxycycline     vomiting  . Levofloxacin Other (See Comments)    Unknown rxn.  Tolerates Cipro  . Mometasone Furoate Other (See Comments)     nasonex causes swollen throat, thrush  . Penicillins Other (See Comments)    Unknown reaction noted on hospice paperwork  . Sulfonamide Derivatives Other (See Comments)    Unknown reaction noted on hospice paperwork   Current Outpatient Prescriptions on File Prior to Visit  Medication Sig Dispense Refill  . albuterol (PROVENTIL) (2.5 MG/3ML) 0.083% nebulizer solution Take 2.5 mg by nebulization every 6 (six) hours as needed (respiratory distress).      Marland Kitchen albuterol (VENTOLIN HFA) 108 (90 BASE) MCG/ACT inhaler Inhale 2 puffs into the lungs every 6 (six) hours as needed.  1 Inhaler  3  . ALPRAZolam (XANAX) 0.5 MG tablet Take 1 tablet (0.5 mg total) by mouth 4 (four) times daily as needed for anxiety.  120 tablet  1  . cholestyramine light (PREVALITE) 4 G packet Take 1 packet (4 g total) by mouth 2 (two) times daily.  60 packet  5  . ciprofloxacin (CIPRO) 500 MG tablet Take 1 tablet (500 mg total) by mouth 2 (two) times daily.  14 tablet  0  . ciprofloxacin (CIPRO) 750 MG tablet Take 1 tablet (750 mg total) by mouth 2 (two) times daily.  20 tablet  0  . citalopram (CELEXA) 40 MG tablet Take 1 tablet (40 mg total) by mouth daily.  30 tablet  6  . diphenoxylate-atropine (LOMOTIL) 2.5-0.025 MG per tablet Take 1 tablet by mouth every 6 (six) hours as needed for diarrhea or loose  stools.  30 tablet  0  . fluticasone (FLONASE) 50 MCG/ACT nasal spray 2 sprays each nostril daily-hospice  16 g  0  . guaiFENesin-codeine (CHERATUSSIN AC) 100-10 MG/5ML syrup Take 5-10 mLs by mouth every 6 (six) hours as needed for cough.  120 mL  0  . levothyroxine (SYNTHROID, LEVOTHROID) 100 MCG tablet Take 0.5 tablets (50 mcg total) by mouth daily before breakfast.  15 tablet  6  . loperamide (IMODIUM A-D) 2 MG tablet Take 2 mg by mouth as needed for diarrhea or loose stools.      . metroNIDAZOLE (FLAGYL) 250 MG tablet Take 1 tablet (250 mg total) by mouth 3 (three) times daily.  21 tablet  0  . metroNIDAZOLE (FLAGYL) 500 MG tablet Take 1 tablet (500 mg total) by mouth 3 (three) times daily.  15 tablet  3  . Oxycodone HCl 10 MG TABS Take 1-2 tablets (10-20 mg total) by mouth every 4 (four) hours as needed (pain).  180 tablet  0  . predniSONE (DELTASONE) 10 MG tablet Take 4 tabs po x 2 days, then 2 x 2 days, then 1 x 2 days then stop.  14 tablet  0  . prochlorperazine (COMPAZINE) 10 MG tablet Take 1 tablet (10 mg total) by mouth  every 6 (six) hours as needed (nausea).  30 tablet  1  . QC SENNA-S 8.6-50 MG per tablet TAKE 1 TO 5 TABLETS BY MOUTH ONE TO TWO TIMES DAILY AS NEEDED FOR CONSTIPATION  60 tablet  6  . senna (SENOKOT) 8.6 MG TABS Take 1 tablet by mouth daily as needed (constipation).      . traZODone (DESYREL) 150 MG tablet Take 1 tablet (150 mg total) by mouth at bedtime.  30 tablet  2  . triamcinolone (KENALOG) 0.025 % cream Apply 1 application topically 3 (three) times daily as needed (psoriasis).       No current facility-administered medications on file prior to visit.

## 2014-02-13 NOTE — Telephone Encounter (Signed)
Called spoke with celeste. Aware of recs. RX sent in. Nothing further needed

## 2014-02-13 NOTE — Telephone Encounter (Signed)
Take flagyl  tid x 7days Increase lomotil 2 every 4 hours

## 2014-02-14 ENCOUNTER — Telehealth: Payer: Self-pay | Admitting: Critical Care Medicine

## 2014-02-14 MED ORDER — DIPHENOXYLATE-ATROPINE 2.5-0.025 MG PO TABS: ORAL_TABLET | ORAL | Status: DC

## 2014-02-14 NOTE — Telephone Encounter (Signed)
Called and spoke with celeste from hospice and she stated that since PW increase the lomotil she will run out of this and wanted to have a refill sent in to the pharmacy.  This has been done and nothing further is needed

## 2014-02-20 ENCOUNTER — Telehealth: Payer: Self-pay | Admitting: Critical Care Medicine

## 2014-02-20 NOTE — Telephone Encounter (Signed)
Spoke with the pt  She is c/o swelling in her legs x 4 days  She states that her legs are yellow and painful  She states that [place], her hospice nurse knows about this issue but does not think that it is a big deal  Pt is also c/o "pain in my kidneys" and feels like she has to urinate, but output is scant and urine is brown and foamy  No fever  She is asking for recs and maybe to have orders for hospice to draw labs on her  PW on vacation so will have to forward to doc of the day  Please advise, thanks!

## 2014-02-20 NOTE — Telephone Encounter (Signed)
Per SN: ok for BMET, CBC with Diff, UA, and C&S.   Called, spoke with pt - informed her of above.  Pt states Celeste's # is [id number]. ATC this # but call not going through.   ATC hospice, [phone], to contact Celeste.  Hospice currently closed.  Will need to call tomorrow.

## 2014-02-21 MED ORDER — DIPHENOXYLATE-ATROPINE 2.5-0.025 MG PO TABS: ORAL_TABLET | ORAL | Status: DC

## 2014-02-21 NOTE — Telephone Encounter (Signed)
Patient is wanting to speak to nurse about blood work she is needing to have.  [phone]

## 2014-02-21 NOTE — Telephone Encounter (Signed)
Brooke [name] from Hospice calling to check status of lab work.  Also would like to have a refill for lomotil called into [facility].  You may call her back at [phone]

## 2014-02-21 NOTE — Telephone Encounter (Signed)
Per SN-ok to order and have LFT's. If this is/sounds Urgent then patient could go to ER for eval and get labs,etc done quickly.

## 2014-02-21 NOTE — Telephone Encounter (Signed)
Called spoke with patient.  She asked what labs are being ordered > advised BMET, CBCD, UA, C&S.  Pt is asking to have LFT's added.  She stated that she feels something is wrong and does not want "just her kidney's tested".  Pt feels this is "imperative" and stated "I'm begging you to test my liver."  Advised pt will need to ask physician if okay to add this test.  Advised pt will call her back once we receive an answer.  Pt voiced her understanding.  Dr Kriste Basque, since you okayed the labs on 9.15.15 would it be appropriate to add LFT's?  Thanks.

## 2014-02-21 NOTE — Telephone Encounter (Signed)
I refilled Lomotil  Spoke with Celeste and notified of all lab orders  She states that the pt is not jaundice and does not have any swelling in her legs  Nothing further needed

## 2014-02-21 NOTE — Telephone Encounter (Signed)
LMTCB for [employer]

## 2014-02-28 ENCOUNTER — Telehealth: Payer: Self-pay | Admitting: Critical Care Medicine

## 2014-02-28 NOTE — Telephone Encounter (Signed)
Decided not to leave a message.  Holly D Pryor ° °

## 2014-03-02 ENCOUNTER — Encounter: Payer: Self-pay | Admitting: Critical Care Medicine

## 2014-03-08 ENCOUNTER — Telehealth: Payer: Self-pay | Admitting: Critical Care Medicine

## 2014-03-08 NOTE — Telephone Encounter (Signed)
noted 

## 2014-03-08 NOTE — Telephone Encounter (Signed)
Will forward to PW to make him aware that hospice called to let PW know that the pt refused the nurses today.

## 2014-03-15 ENCOUNTER — Other Ambulatory Visit: Payer: Self-pay | Admitting: Critical Care Medicine

## 2014-03-20 ENCOUNTER — Telehealth: Payer: Self-pay | Admitting: Critical Care Medicine

## 2014-03-20 NOTE — Telephone Encounter (Signed)
PW, do you rec that the pt continue prevalite Please advise, thanks!

## 2014-03-21 MED ORDER — CHOLESTYRAMINE LIGHT 4 G PO PACK: PACK | ORAL | Status: DC

## 2014-03-21 NOTE — Telephone Encounter (Signed)
Yes, i would continue

## 2014-03-21 NOTE — Telephone Encounter (Signed)
Spoke with  Joni ReiningNicole, Hospice nurse [facility] Nothing further needed.

## 2014-03-28 ENCOUNTER — Other Ambulatory Visit: Payer: Self-pay | Admitting: Critical Care Medicine

## 2014-04-16 ENCOUNTER — Telehealth: Payer: Self-pay | Admitting: Critical Care Medicine

## 2014-04-16 NOTE — Telephone Encounter (Signed)
(  Continued from above)  Pt's O2 sats on 2 liters went up to 98%.  States the 3 new meds that were started are the following:   * midodrine, 10 mg three times daily for hypertension  * amitriptyline, 25 mg-taken in PM for pain  * Naproxen, 250 mg every 12 hours for pain  Antionette FairyHolly D Pryor

## 2014-04-16 NOTE — Telephone Encounter (Signed)
LM for Joni Reiningicole to return call in AM

## 2014-04-17 NOTE — Telephone Encounter (Signed)
i am aware.

## 2014-04-17 NOTE — Telephone Encounter (Signed)
Spoke with [place], pt's Hospice RN  She states that Dr Gibson RampFeldman started the pt on midodrine 10 mg tid, amitriptylline 25 mg qhs, and naproxen 250 mg q12 h for pain  The pt's daughter called nurse and reported pt was slurring her speech  When nurse came to home for assessment her sats were 86% ra- increased to 98%2lpm and pt advised to use o2 24/7  Pt seemed to answer questions appropriately and so Dr Gibson RampFeldman wanted her to continue current meds  FYI for PW

## 2014-04-30 ENCOUNTER — Telehealth: Payer: Self-pay | Admitting: Critical Care Medicine

## 2014-04-30 MED ORDER — DIPHENOXYLATE-ATROPINE 2.5-0.025 MG PO TABS: 1.0000 | ORAL_TABLET | Freq: Four times a day (QID) | ORAL | Status: DC | PRN

## 2014-04-30 NOTE — Telephone Encounter (Signed)
Yes i am ok with refill lomotil

## 2014-04-30 NOTE — Telephone Encounter (Signed)
Spoke with Brooke [name], states that pt needs a refill on Lomotil called in to [facility].  Pt last seen 07/05/13.  Dr. Delford FieldWright, are you ok with this refill?  Thanks!

## 2014-04-30 NOTE — Telephone Encounter (Signed)
Rx Called to pharmacy.  Joni ReiningNicole with Hospice notified.

## 2014-05-24 ENCOUNTER — Telehealth: Payer: Self-pay | Admitting: Critical Care Medicine

## 2014-05-24 NOTE — Telephone Encounter (Signed)
Please advise Dr. Wright thanks 

## 2014-05-24 NOTE — Telephone Encounter (Signed)
lmomtcb x1 for Jan

## 2014-05-24 NOTE — Telephone Encounter (Signed)
Spoke with Brooke [name] (hospice). She reports pt called in stating she has been running a low grade fever less than 100 for several weeks now, jerking all over body, can't walk straight, feeling very confused, loss of appetite, not able to finish complete sentences d/t her memory, sweats. Pt daughter read online that too much "zinc" in the body could cause some of these symptoms. Per pt she has used fixodent for 25 years and read this has zinc in it. Pt called CDC and was told too much zinc could cause this and was told she needed to be tested. Pt is wanting to have lab work done to test her zinc and copper level.  Per hospice this will not be covered and pt would have to pay for this.  Dr. Delford FieldWright is not available this afternoon. Please advise Dr. Maple HudsonYoung thanks  Allergies  Allergen Reactions  . Ambien [Zolpidem Tartrate]     Sleep walking and altered mental status  . Doxycycline     vomiting  . Levofloxacin Other (See Comments)    Unknown rxn.  Tolerates Cipro  . Mometasone Furoate Other (See Comments)     nasonex causes swollen throat, thrush  . Penicillins Other (See Comments)    Unknown reaction noted on hospice paperwork  . Sulfonamide Derivatives Other (See Comments)    Unknown reaction noted on hospice paperwork

## 2014-05-24 NOTE — Telephone Encounter (Signed)
Please forward this to Dr Delford FieldWright- he will be in on Monday.

## 2014-05-24 NOTE — Telephone Encounter (Signed)
This is NOT due to zinc or copper issues It is due to disease progression.  Please apply comfort care only here

## 2014-05-25 NOTE — Telephone Encounter (Signed)
Pt returning call.Brooke [name] ° °

## 2014-05-25 NOTE — Telephone Encounter (Signed)
Spoke with Jan, she is aware of PW's recs.  Nothing further needed at this time.

## 2014-05-25 NOTE — Telephone Encounter (Signed)
LMTCB

## 2014-06-05 ENCOUNTER — Other Ambulatory Visit: Payer: Self-pay | Admitting: *Deleted

## 2014-06-05 MED ORDER — FLUTICASONE PROPIONATE 50 MCG/ACT NA SUSP: NASAL | Status: DC

## 2014-06-11 ENCOUNTER — Other Ambulatory Visit: Payer: Self-pay | Admitting: Critical Care Medicine

## 2014-07-16 ENCOUNTER — Telehealth: Payer: Self-pay | Admitting: Critical Care Medicine

## 2014-07-16 NOTE — Telephone Encounter (Signed)
Return call.Brooke [name] °

## 2014-07-16 NOTE — Telephone Encounter (Signed)
Called and spoke to Brooke [name]. [name] questioning if lasix was on the pt's med list or history. Lasix has not been previously prescribed per the pt's chart. [name] stated PW spoke with the hospice physician and both stated they wanted the pt to be seen by PW. Appt made with PW on 07/18/14. Pt aware. Nothing further needed.

## 2014-07-16 NOTE — Telephone Encounter (Signed)
lmtcb for [name].

## 2014-07-16 NOTE — Telephone Encounter (Signed)
[name] returning call she can be reached at [phone].Caren GriffinsStanley A Dalton

## 2014-07-18 ENCOUNTER — Ambulatory Visit (INDEPENDENT_AMBULATORY_CARE_PROVIDER_SITE_OTHER): Payer: Medicare Other | Admitting: Critical Care Medicine

## 2014-07-18 ENCOUNTER — Telehealth: Payer: Self-pay | Admitting: Critical Care Medicine

## 2014-07-18 ENCOUNTER — Other Ambulatory Visit: Payer: Self-pay | Admitting: Critical Care Medicine

## 2014-07-18 ENCOUNTER — Encounter: Payer: Self-pay | Admitting: Critical Care Medicine

## 2014-07-18 VITALS — BP 100/60 | HR 82 | Temp 97.7°F | Ht 65.0 in | Wt 102.0 lb

## 2014-07-18 DIAGNOSIS — Z515 Encounter for palliative care: Secondary | ICD-10-CM

## 2014-07-18 DIAGNOSIS — E43 Unspecified severe protein-calorie malnutrition: Secondary | ICD-10-CM

## 2014-07-18 DIAGNOSIS — J9610 Chronic respiratory failure, unspecified whether with hypoxia or hypercapnia: Secondary | ICD-10-CM

## 2014-07-18 DIAGNOSIS — J479 Bronchiectasis, uncomplicated: Secondary | ICD-10-CM | POA: Diagnosis not present

## 2014-07-18 DIAGNOSIS — Z66 Do not resuscitate: Secondary | ICD-10-CM

## 2014-07-18 MED ORDER — ALBUTEROL SULFATE HFA 108 (90 BASE) MCG/ACT IN AERS: 2.0000 | INHALATION_SPRAY | Freq: Four times a day (QID) | RESPIRATORY_TRACT | Status: DC | PRN

## 2014-07-18 MED ORDER — OXYCODONE HCL 10 MG PO TABS: 10.0000 mg | ORAL_TABLET | ORAL | Status: DC | PRN

## 2014-07-18 MED ORDER — FUROSEMIDE 20 MG PO TABS: 20.0000 mg | ORAL_TABLET | Freq: Every day | ORAL | Status: DC | PRN

## 2014-07-18 MED ORDER — TRAZODONE HCL 100 MG PO TABS: 200.0000 mg | ORAL_TABLET | Freq: Every day | ORAL | Status: AC

## 2014-07-18 MED ORDER — ALBUTEROL SULFATE (2.5 MG/3ML) 0.083% IN NEBU: 2.5000 mg | INHALATION_SOLUTION | Freq: Four times a day (QID) | RESPIRATORY_TRACT | Status: AC | PRN

## 2014-07-18 MED ORDER — GUAIFENESIN-CODEINE 100-10 MG/5ML PO SYRP: 5.0000 mL | ORAL_SOLUTION | Freq: Four times a day (QID) | ORAL | Status: DC | PRN

## 2014-07-18 NOTE — Telephone Encounter (Signed)
Calling back to see if she can also get something for her skin, states PW told her her would prescribe her something.

## 2014-07-18 NOTE — Progress Notes (Signed)
Subjective:    Patient ID: Brooke [name], female    DOB: 1940/11/17, 74 y.o.   MRN: [id number]  HPI 74 y.o..WF with Bronchiectasis. MRSA colonization  07/05/2013 Chief Complaint  Patient presents with  . Recurrent Pneumonia    Breathing is unchanged. Reports SOB, chest tightness, coughing with production of green mucus, wheezing and diarrhea. Had been running a fever of  99.8.  Pt with chronic pain, not able to breathe.   Hurts all over. Notes rib pain. If tries to cough hurts all over.   Mucus comes up, not daily.  occ gets up some mucus.  Remains green.  Notes severe diarrhea.   Weight unchanged  The pt has been in hospice since 10/2011.  Recently Hospice d/c services d/t thinking pt had improved, gained 20#, driving her self, no longer homebound, not using oxygen.   However Pt has not been driving, has not gained 40#20#, only 5#, remains orthostatic and severely malnourished in severe chronic pain   07/18/2014 Chief Complaint  Patient presents with  . Follow-up    SOB and fatigues easily.  Weakness. Cough with dark green mucus.  Pt not seen in one year. Has been in hospice since 10/2011 no clear cut rapid decline. Narcotic dependent for chronic pain.  Dependent on cough syrup.  Bronchiectasis, still daily mucus production.  Pt ready to die and tired of suffering. Hospice plan is to d/c from hospice to home palliative care program.  Here for opinion on new plan. Pain in R and ant chest area.    Coughs dark mucus green. No f/c/s. Weight slowly falling   Past Medical History  Diagnosis Date  . Hypothyroidism   . History of allergy   . Mycobacterium avium complex   . Bronchiectasis     colonized mrsa, s/p vancomyin IV 10 days 5/10 and 7 days 6/10  . Pneumonia   . Headache(784.0)      No family history on file.   History   Social History  . Marital Status: Widowed    Spouse Name: N/A  . Number of Children: N/A  . Years of Education: N/A   Occupational History  . Not on file.    Social History Main Topics  . Smoking status: Former Smoker -- 1.00 packs/day for 3 years    Types: Cigarettes    Quit date: 06/08/1962  . Smokeless tobacco: Never Used  . Alcohol Use: No  . Drug Use: No  . Sexual Activity: Not on file   Other Topics Concern  . Not on file   Social History Narrative     Allergies  Allergen Reactions  . Ambien [Zolpidem Tartrate]     Sleep walking and altered mental status  . Doxycycline     vomiting  . Levofloxacin Other (See Comments)    Unknown rxn.  Tolerates Cipro  . Mometasone Furoate Other (See Comments)     nasonex causes swollen throat, thrush  . Penicillins Other (See Comments)    Unknown reaction noted on hospice paperwork  . Sulfonamide Derivatives Other (See Comments)    Unknown reaction noted on hospice paperwork     Outpatient Prescriptions Prior to Visit  Medication Sig Dispense Refill  . ALPRAZolam (XANAX) 0.5 MG tablet Take 1 tablet (0.5 mg total) by mouth 4 (four) times daily as needed for anxiety. 120 tablet 1  . citalopram (CELEXA) 40 MG tablet TAKE ONE (1) TABLET BY MOUTH EVERY DAY 30 tablet 2  . diphenoxylate-atropine (LOMOTIL) 2.5-0.025 MG per  tablet TAKE ONE (1) TABLET BY MOUTH EVERY 6 HOURS AS NEEDED FOR WATERY DIARRHEA 30 tablet 2  . fluticasone (FLONASE) 50 MCG/ACT nasal spray 2 sprays each nostril daily-hospice 16 g 1  . levothyroxine (SYNTHROID, LEVOTHROID) 100 MCG tablet Take 0.5 tablets (50 mcg total) by mouth daily before breakfast. 15 tablet 6  . loperamide (IMODIUM A-D) 2 MG tablet Take 2 mg by mouth as needed for diarrhea or loose stools.    . prochlorperazine (COMPAZINE) 10 MG tablet TAKE ONE (1) TABLET BY MOUTH EVERY 6 HOURS AS NEEDED FOR NAUSEA 30 tablet 3  . QC SENNA-S 8.6-50 MG per tablet TAKE 1 TO 5 TABLETS BY MOUTH ONE TO TWO TIMES DAILY AS NEEDED FOR CONSTIPATION 60 tablet 6  . senna (SENOKOT) 8.6 MG TABS Take 1 tablet by mouth daily as needed (constipation).    . triamcinolone (KENALOG) 0.025 %  cream Apply 1 application topically 3 (three) times daily as needed (psoriasis).    Marland Kitchen albuterol (PROVENTIL) (2.5 MG/3ML) 0.083% nebulizer solution Take 2.5 mg by nebulization every 6 (six) hours as needed (respiratory distress).    Marland Kitchen albuterol (VENTOLIN HFA) 108 (90 BASE) MCG/ACT inhaler Inhale 2 puffs into the lungs every 6 (six) hours as needed. 1 Inhaler 3  . diphenoxylate-atropine (LOMOTIL) 2.5-0.025 MG per tablet Take 2 tablet by mouth every 4 hours as needed for watery diarrhea 30 tablet 0  . diphenoxylate-atropine (LOMOTIL) 2.5-0.025 MG per tablet Take 1 tablet by mouth every 6 (six) hours as needed for diarrhea or loose stools. 30 tablet 0  . guaiFENesin-codeine (CHERATUSSIN AC) 100-10 MG/5ML syrup Take 5-10 mLs by mouth every 6 (six) hours as needed for cough. 120 mL 0  . Oxycodone HCl 10 MG TABS Take 1-2 tablets (10-20 mg total) by mouth every 4 (four) hours as needed (pain). 180 tablet 0  . cholestyramine light (PREVALITE) 4 G packet MIX ONE PACKET (4 GRAMS TOTAL) WITH YOURCHOICE OF BEVERAGE AND DRINK TWICE DAILY (Patient not taking: Reported on 07/18/2014) 60 packet 6  . ciprofloxacin (CIPRO) 500 MG tablet Take 1 tablet (500 mg total) by mouth 2 (two) times daily. (Patient not taking: Reported on 07/18/2014) 14 tablet 0  . ciprofloxacin (CIPRO) 750 MG tablet Take 1 tablet (750 mg total) by mouth 2 (two) times daily. (Patient not taking: Reported on 07/18/2014) 20 tablet 0  . metroNIDAZOLE (FLAGYL) 500 MG tablet Take 1 tablet (500 mg total) by mouth 3 (three) times daily. (Patient not taking: Reported on 07/18/2014) 21 tablet 0  . predniSONE (DELTASONE) 10 MG tablet Take 4 tabs po x 2 days, then 2 x 2 days, then 1 x 2 days then stop. (Patient not taking: Reported on 07/18/2014) 14 tablet 0  . traZODone (DESYREL) 150 MG tablet Take 1 tablet (150 mg total) by mouth at bedtime. (Patient not taking: Reported on 07/18/2014) 30 tablet 2   No facility-administered medications prior to visit.       Review of Systems Constitutional:   No  weight loss, night sweats,  Fevers, chills, fatigue, lassitude. HEENT:   No headaches,  Difficulty swallowing,  Tooth/dental problems,  Sore throat,                No sneezing, itching, ear ache,  +nasal congestion, post nasal drip,   CV:  Notes  chest pain,  Orthopnea, PND, swelling in lower extremities, anasarca, dizziness, palpitations  GI  No heartburn, indigestion, abdominal pain,  nausea,    +++diarrhea, +++change in bowel habits, +++loss  of appetite  Resp: Notes  shortness of breath with exertion and rest.    No coughing up of blood.    No chest wall deformity  Skin: no rash or lesions.  GU: no dysuria, change in color of urine, no urgency or frequency.  No flank pain.  MS:  Diffuse body pain  Psych: depressed      Objective:   Physical Exam  BP 100/60 mmHg  Pulse 82  Temp(Src) 97.7 F (36.5 C) (Oral)  Ht  (1.651 m)  Wt 102 lb (46.267 kg)  BMI 16.97 kg/m2  SpO2 96%  Wt Readings from Last 3 Encounters:  07/18/14 102 lb (46.267 kg)  07/05/13 105 lb (47.628 kg)  10/05/12 98 lb 11.2 oz (44.77 kg)   Gen: Thin and cachectic white female  ENT: No lesions,  mouth clear,  oropharynx clear,   Neck: No JVD, no TMG, no carotid bruits  Lungs: No use of accessory muscles, no dullness to percussion, coarse BS w/ few rhonchi esp RLL.  Cardiovascular: RRR, heart sounds normal, no murmur or gallops, no peripheral edema  Abdomen: soft and NT, no HSM,  BS normal  Musculoskeletal: No deformities, no cyanosis or clubbing  Neuro: alert, non focal  Skin: Warm, no lesions or rashes       No results found.  Assessment & Plan:   BRONCHIECTASIS Bronchiectasis, stable at present Plan  cont inhaled meds   Severe protein-calorie malnutrition Ongoing prot cal malnutrtion  Wt Readings from Last 3 Encounters:  07/18/14 102 lb (46.267 kg)  07/05/13 105 lb (47.628 kg)  10/05/12 98 lb 11.2 oz (44.77 kg)       Encounter for palliative care Hospice pt x 3 years.  No clearcut rapid decline, just slow decline I agree plan should be : 1. Home based MD : [name] to refer>> for pain management 2. Home based palliative care NP 3. Pulmonary office to f/u for pulmonary needs, try to do most of this by phone. If pt declines rapidly again could be hospice eligible again    I had an extended discussion with the patient reviewing all relevant studies completed to date and  lasting 15 to 20 minutes of a 25 minute visit on the following ongoing concerns:    Re: disease process, hospice status, new plan of care   Updated Medication List Outpatient Encounter Prescriptions as of 07/18/2014  Medication Sig  . albuterol (PROVENTIL) (2.5 MG/3ML) 0.083% nebulizer solution Take 3 mLs (2.5 mg total) by nebulization every 6 (six) hours as needed (respiratory distress).  Marland Kitchen albuterol (VENTOLIN HFA) 108 (90 BASE) MCG/ACT inhaler Inhale 2 puffs into the lungs every 6 (six) hours as needed.  . ALPRAZolam (XANAX) 0.5 MG tablet Take 1 tablet (0.5 mg total) by mouth 4 (four) times daily as needed for anxiety.  . Aspirin-Salicylamide-Caffeine (BC HEADACHE POWDER PO) Take by mouth daily.  . citalopram (CELEXA) 40 MG tablet TAKE ONE (1) TABLET BY MOUTH EVERY DAY  . diphenoxylate-atropine (LOMOTIL) 2.5-0.025 MG per tablet TAKE ONE (1) TABLET BY MOUTH EVERY 6 HOURS AS NEEDED FOR WATERY DIARRHEA  . fluticasone (FLONASE) 50 MCG/ACT nasal spray 2 sprays each nostril daily-hospice  . guaiFENesin-codeine (CHERATUSSIN AC) 100-10 MG/5ML syrup Take 5-10 mLs by mouth every 6 (six) hours as needed for cough.  . levothyroxine (SYNTHROID, LEVOTHROID) 100 MCG tablet Take 0.5 tablets (50 mcg total) by mouth daily before breakfast.  . loperamide (IMODIUM A-D) 2 MG tablet Take 2 mg by mouth as needed for  diarrhea or loose stools.  Marland Kitchen loratadine (CLARITIN) 10 MG tablet Take 10 mg by mouth daily as needed for allergies.  . naproxen (NAPROSYN) 250 MG  tablet Take 1 tablet by mouth 2 (two) times daily.  . Oxycodone HCl 10 MG TABS Take 1-2 tablets (10-20 mg total) by mouth every 4 (four) hours as needed (pain).  . prochlorperazine (COMPAZINE) 10 MG tablet TAKE ONE (1) TABLET BY MOUTH EVERY 6 HOURS AS NEEDED FOR NAUSEA  . QC SENNA-S 8.6-50 MG per tablet TAKE 1 TO 5 TABLETS BY MOUTH ONE TO TWO TIMES DAILY AS NEEDED FOR CONSTIPATION  . senna (SENOKOT) 8.6 MG TABS Take 1 tablet by mouth daily as needed (constipation).  . traZODone (DESYREL) 100 MG tablet Take 2 tablets (200 mg total) by mouth at bedtime.  . triamcinolone (KENALOG) 0.025 % cream Apply 1 application topically 3 (three) times daily as needed (psoriasis).  . [DISCONTINUED] albuterol (PROVENTIL) (2.5 MG/3ML) 0.083% nebulizer solution Take 2.5 mg by nebulization every 6 (six) hours as needed (respiratory distress).  . [DISCONTINUED] albuterol (VENTOLIN HFA) 108 (90 BASE) MCG/ACT inhaler Inhale 2 puffs into the lungs every 6 (six) hours as needed.  . [DISCONTINUED] diphenoxylate-atropine (LOMOTIL) 2.5-0.025 MG per tablet Take 2 tablet by mouth every 4 hours as needed for watery diarrhea  . [DISCONTINUED] diphenoxylate-atropine (LOMOTIL) 2.5-0.025 MG per tablet Take 1 tablet by mouth every 6 (six) hours as needed for diarrhea or loose stools.  . [DISCONTINUED] guaiFENesin-codeine (CHERATUSSIN AC) 100-10 MG/5ML syrup Take 5-10 mLs by mouth every 6 (six) hours as needed for cough.  . [DISCONTINUED] Oxycodone HCl 10 MG TABS Take 1-2 tablets (10-20 mg total) by mouth every 4 (four) hours as needed (pain).  . [DISCONTINUED] traZODone (DESYREL) 100 MG tablet Take 200 mg by mouth at bedtime.  . cholestyramine light (PREVALITE) 4 G packet MIX ONE PACKET (4 GRAMS TOTAL) WITH YOURCHOICE OF BEVERAGE AND DRINK TWICE DAILY (Patient not taking: Reported on 07/18/2014)  . [DISCONTINUED] ciprofloxacin (CIPRO) 500 MG tablet Take 1 tablet (500 mg total) by mouth 2 (two) times daily. (Patient not taking: Reported  on 07/18/2014)  . [DISCONTINUED] ciprofloxacin (CIPRO) 750 MG tablet Take 1 tablet (750 mg total) by mouth 2 (two) times daily. (Patient not taking: Reported on 07/18/2014)  . [DISCONTINUED] metroNIDAZOLE (FLAGYL) 500 MG tablet Take 1 tablet (500 mg total) by mouth 3 (three) times daily. (Patient not taking: Reported on 07/18/2014)  . [DISCONTINUED] predniSONE (DELTASONE) 10 MG tablet Take 4 tabs po x 2 days, then 2 x 2 days, then 1 x 2 days then stop. (Patient not taking: Reported on 07/18/2014)  . [DISCONTINUED] traZODone (DESYREL) 150 MG tablet Take 1 tablet (150 mg total) by mouth at bedtime. (Patient not taking: Reported on 07/18/2014)         Updated Medication List Outpatient Encounter Prescriptions as of 07/18/2014  Medication Sig  . albuterol (PROVENTIL) (2.5 MG/3ML) 0.083% nebulizer solution Take 3 mLs (2.5 mg total) by nebulization every 6 (six) hours as needed (respiratory distress).  Marland Kitchen albuterol (VENTOLIN HFA) 108 (90 BASE) MCG/ACT inhaler Inhale 2 puffs into the lungs every 6 (six) hours as needed.  . ALPRAZolam (XANAX) 0.5 MG tablet Take 1 tablet (0.5 mg total) by mouth 4 (four) times daily as needed for anxiety.  . Aspirin-Salicylamide-Caffeine (BC HEADACHE POWDER PO) Take by mouth daily.  . citalopram (CELEXA) 40 MG tablet TAKE ONE (1) TABLET BY MOUTH EVERY DAY  . diphenoxylate-atropine (LOMOTIL) 2.5-0.025 MG per tablet TAKE ONE (1) TABLET  BY MOUTH EVERY 6 HOURS AS NEEDED FOR WATERY DIARRHEA  . fluticasone (FLONASE) 50 MCG/ACT nasal spray 2 sprays each nostril daily-hospice  . guaiFENesin-codeine (CHERATUSSIN AC) 100-10 MG/5ML syrup Take 5-10 mLs by mouth every 6 (six) hours as needed for cough.  . levothyroxine (SYNTHROID, LEVOTHROID) 100 MCG tablet Take 0.5 tablets (50 mcg total) by mouth daily before breakfast.  . loperamide (IMODIUM A-D) 2 MG tablet Take 2 mg by mouth as needed for diarrhea or loose stools.  Marland Kitchen loratadine (CLARITIN) 10 MG tablet Take 10 mg by mouth daily as  needed for allergies.  . naproxen (NAPROSYN) 250 MG tablet Take 1 tablet by mouth 2 (two) times daily.  . Oxycodone HCl 10 MG TABS Take 1-2 tablets (10-20 mg total) by mouth every 4 (four) hours as needed (pain).  . prochlorperazine (COMPAZINE) 10 MG tablet TAKE ONE (1) TABLET BY MOUTH EVERY 6 HOURS AS NEEDED FOR NAUSEA  . QC SENNA-S 8.6-50 MG per tablet TAKE 1 TO 5 TABLETS BY MOUTH ONE TO TWO TIMES DAILY AS NEEDED FOR CONSTIPATION  . senna (SENOKOT) 8.6 MG TABS Take 1 tablet by mouth daily as needed (constipation).  . traZODone (DESYREL) 100 MG tablet Take 2 tablets (200 mg total) by mouth at bedtime.  . triamcinolone (KENALOG) 0.025 % cream Apply 1 application topically 3 (three) times daily as needed (psoriasis).  . [DISCONTINUED] albuterol (PROVENTIL) (2.5 MG/3ML) 0.083% nebulizer solution Take 2.5 mg by nebulization every 6 (six) hours as needed (respiratory distress).  . [DISCONTINUED] albuterol (VENTOLIN HFA) 108 (90 BASE) MCG/ACT inhaler Inhale 2 puffs into the lungs every 6 (six) hours as needed.  . [DISCONTINUED] diphenoxylate-atropine (LOMOTIL) 2.5-0.025 MG per tablet Take 2 tablet by mouth every 4 hours as needed for watery diarrhea  . [DISCONTINUED] diphenoxylate-atropine (LOMOTIL) 2.5-0.025 MG per tablet Take 1 tablet by mouth every 6 (six) hours as needed for diarrhea or loose stools.  . [DISCONTINUED] guaiFENesin-codeine (CHERATUSSIN AC) 100-10 MG/5ML syrup Take 5-10 mLs by mouth every 6 (six) hours as needed for cough.  . [DISCONTINUED] Oxycodone HCl 10 MG TABS Take 1-2 tablets (10-20 mg total) by mouth every 4 (four) hours as needed (pain).  . [DISCONTINUED] traZODone (DESYREL) 100 MG tablet Take 200 mg by mouth at bedtime.  . cholestyramine light (PREVALITE) 4 G packet MIX ONE PACKET (4 GRAMS TOTAL) WITH YOURCHOICE OF BEVERAGE AND DRINK TWICE DAILY (Patient not taking: Reported on 07/18/2014)  . [DISCONTINUED] ciprofloxacin (CIPRO) 500 MG tablet Take 1 tablet (500 mg total) by mouth  2 (two) times daily. (Patient not taking: Reported on 07/18/2014)  . [DISCONTINUED] ciprofloxacin (CIPRO) 750 MG tablet Take 1 tablet (750 mg total) by mouth 2 (two) times daily. (Patient not taking: Reported on 07/18/2014)  . [DISCONTINUED] metroNIDAZOLE (FLAGYL) 500 MG tablet Take 1 tablet (500 mg total) by mouth 3 (three) times daily. (Patient not taking: Reported on 07/18/2014)  . [DISCONTINUED] predniSONE (DELTASONE) 10 MG tablet Take 4 tabs po x 2 days, then 2 x 2 days, then 1 x 2 days then stop. (Patient not taking: Reported on 07/18/2014)  . [DISCONTINUED] traZODone (DESYREL) 150 MG tablet Take 1 tablet (150 mg total) by mouth at bedtime. (Patient not taking: Reported on 07/18/2014)

## 2014-07-18 NOTE — Telephone Encounter (Signed)
Ok for lasix 20mg  daily prn #30

## 2014-07-18 NOTE — Telephone Encounter (Signed)
Pt was in office today and forgot to mention some swelling she is having in legs.  Worse in left leg and more so around the knee area.  She states Dr Delford FieldWright had given her something for swelling in the past ( ? Lasix) and wants to know if he will prescribe this again.  [facility].  Please advise

## 2014-07-18 NOTE — Telephone Encounter (Signed)
Pt aware that [facility] is [facility]. Nothing further needed.

## 2014-07-20 DIAGNOSIS — Z66 Do not resuscitate: Secondary | ICD-10-CM | POA: Insufficient documentation

## 2014-07-20 NOTE — Assessment & Plan Note (Signed)
Bronchiectasis, stable at present Plan  cont inhaled meds

## 2014-07-20 NOTE — Assessment & Plan Note (Signed)
Ongoing prot cal malnutrtion  Wt Readings from Last 3 Encounters:  07/18/14 102 lb (46.267 kg)  07/05/13 105 lb (47.628 kg)  10/05/12 98 lb 11.2 oz (44.77 kg)

## 2014-07-20 NOTE — Assessment & Plan Note (Signed)
Hospice pt x 3 years.  No clearcut rapid decline, just slow decline I agree plan should be : 1. Home based MD : [name] to refer>> for pain management 2. Home based palliative care NP 3. Pulmonary office to f/u for pulmonary needs, try to do most of this by phone. If pt declines rapidly again could be hospice eligible again

## 2014-07-24 ENCOUNTER — Other Ambulatory Visit: Payer: Self-pay | Admitting: *Deleted

## 2014-07-24 MED ORDER — NYSTATIN-TRIAMCINOLONE 100000-0.1 UNIT/GM-% EX CREA
1.0000 | TOPICAL_CREAM | Freq: Two times a day (BID) | CUTANEOUS | Status: DC | PRN
Start: 2014-07-24 — End: 2017-03-18

## 2014-07-24 NOTE — Telephone Encounter (Signed)
[facility] stating Ms. Thana AtesFisk is requesting Dr. Delford FieldWright rx Nystatin/triamcinolone 100000-0.1U bid prn to affected area for her psoriasis.   Per PW: ok to fill this medication with additional refills.   Rx sent.

## 2014-08-16 ENCOUNTER — Emergency Department (HOSPITAL_COMMUNITY): Payer: Commercial Managed Care - HMO

## 2014-08-16 ENCOUNTER — Encounter (HOSPITAL_COMMUNITY): Payer: Self-pay

## 2014-08-16 ENCOUNTER — Observation Stay (HOSPITAL_COMMUNITY)
Admission: EM | Admit: 2014-08-16 | Discharge: 2014-08-18 | Disposition: A | Discharge status: Home / Self Care | Payer: Commercial Managed Care - HMO | Attending: Internal Medicine | Admitting: Internal Medicine

## 2014-08-16 DIAGNOSIS — L89152 Pressure ulcer of sacral region, stage 2: Secondary | ICD-10-CM | POA: Diagnosis not present

## 2014-08-16 DIAGNOSIS — R29898 Other symptoms and signs involving the musculoskeletal system: Secondary | ICD-10-CM | POA: Diagnosis present

## 2014-08-16 DIAGNOSIS — Z7952 Long term (current) use of systemic steroids: Secondary | ICD-10-CM | POA: Insufficient documentation

## 2014-08-16 DIAGNOSIS — J9610 Chronic respiratory failure, unspecified whether with hypoxia or hypercapnia: Secondary | ICD-10-CM | POA: Diagnosis not present

## 2014-08-16 DIAGNOSIS — Z7982 Long term (current) use of aspirin: Secondary | ICD-10-CM | POA: Insufficient documentation

## 2014-08-16 DIAGNOSIS — E860 Dehydration: Principal | ICD-10-CM

## 2014-08-16 DIAGNOSIS — E43 Unspecified severe protein-calorie malnutrition: Secondary | ICD-10-CM | POA: Diagnosis not present

## 2014-08-16 DIAGNOSIS — R600 Localized edema: Secondary | ICD-10-CM | POA: Insufficient documentation

## 2014-08-16 DIAGNOSIS — R51 Headache: Secondary | ICD-10-CM | POA: Insufficient documentation

## 2014-08-16 DIAGNOSIS — R64 Cachexia: Secondary | ICD-10-CM | POA: Insufficient documentation

## 2014-08-16 DIAGNOSIS — D638 Anemia in other chronic diseases classified elsewhere: Secondary | ICD-10-CM | POA: Diagnosis not present

## 2014-08-16 DIAGNOSIS — R531 Weakness: Secondary | ICD-10-CM | POA: Diagnosis present

## 2014-08-16 DIAGNOSIS — Z87891 Personal history of nicotine dependence: Secondary | ICD-10-CM | POA: Diagnosis not present

## 2014-08-16 DIAGNOSIS — D72829 Elevated white blood cell count, unspecified: Secondary | ICD-10-CM

## 2014-08-16 DIAGNOSIS — J479 Bronchiectasis, uncomplicated: Secondary | ICD-10-CM

## 2014-08-16 DIAGNOSIS — J302 Other seasonal allergic rhinitis: Secondary | ICD-10-CM | POA: Insufficient documentation

## 2014-08-16 DIAGNOSIS — Z9981 Dependence on supplemental oxygen: Secondary | ICD-10-CM | POA: Diagnosis not present

## 2014-08-16 DIAGNOSIS — L89159 Pressure ulcer of sacral region, unspecified stage: Secondary | ICD-10-CM

## 2014-08-16 DIAGNOSIS — E039 Hypothyroidism, unspecified: Secondary | ICD-10-CM | POA: Diagnosis not present

## 2014-08-16 DIAGNOSIS — J471 Bronchiectasis with (acute) exacerbation: Secondary | ICD-10-CM | POA: Diagnosis not present

## 2014-08-16 DIAGNOSIS — Z79899 Other long term (current) drug therapy: Secondary | ICD-10-CM | POA: Insufficient documentation

## 2014-08-16 LAB — CBC WITH DIFFERENTIAL/PLATELET
BASOS ABS: 0 10*3/uL (ref 0.0–0.1)
BASOS PCT: 0 % (ref 0–1)
EOS PCT: 5 % (ref 0–5)
Eosinophils Absolute: 0.5 10*3/uL (ref 0.0–0.7)
HEMATOCRIT: 33.3 % — AB (ref 36.0–46.0)
HEMOGLOBIN: 10.8 g/dL — AB (ref 12.0–15.0)
Lymphocytes Relative: 16 % (ref 12–46)
Lymphs Abs: 1.7 10*3/uL (ref 0.7–4.0)
MCH: 29.9 pg (ref 26.0–34.0)
MCHC: 32.4 g/dL (ref 30.0–36.0)
MCV: 92.2 fL (ref 78.0–100.0)
MONO ABS: 1 10*3/uL (ref 0.1–1.0)
Monocytes Relative: 10 % (ref 3–12)
NEUTROS PCT: 69 % (ref 43–77)
Neutro Abs: 7.4 10*3/uL (ref 1.7–7.7)
Platelets: 396 10*3/uL (ref 150–400)
RBC: 3.61 MIL/uL — ABNORMAL LOW (ref 3.87–5.11)
RDW: 13.5 % (ref 11.5–15.5)
WBC: 10.6 10*3/uL — ABNORMAL HIGH (ref 4.0–10.5)

## 2014-08-16 LAB — BASIC METABOLIC PANEL
Anion gap: 6 (ref 5–15)
BUN: 22 mg/dL (ref 6–23)
CO2: 32 mmol/L (ref 19–32)
CREATININE: 0.87 mg/dL (ref 0.50–1.10)
Calcium: 8.7 mg/dL (ref 8.4–10.5)
Chloride: 103 mmol/L (ref 96–112)
GFR calc Af Amer: 73 mL/min — ABNORMAL LOW (ref >90)
GFR calc non Af Amer: 63 mL/min — ABNORMAL LOW (ref >90)
GLUCOSE: 90 mg/dL (ref 70–99)
Potassium: 3.9 mmol/L (ref 3.5–5.1)
Sodium: 141 mmol/L (ref 135–145)

## 2014-08-16 LAB — POC OCCULT BLOOD, ED: Fecal Occult Bld: NEGATIVE

## 2014-08-16 MED ORDER — CHLORHEXIDINE GLUCONATE 0.12 % MT SOLN
15.0000 mL | Freq: Two times a day (BID) | OROMUCOSAL | Status: DC
  Administered 2014-08-16 – 2014-08-18 (×3): 15 mL via OROMUCOSAL
  Filled 2014-08-16 (×6): qty 15

## 2014-08-16 MED ORDER — NAPROXEN 250 MG PO TABS
250.0000 mg | ORAL_TABLET | Freq: Two times a day (BID) | ORAL | Status: DC
  Administered 2014-08-16 – 2014-08-18 (×4): 250 mg via ORAL
  Filled 2014-08-16 (×5): qty 1

## 2014-08-16 MED ORDER — OXYCODONE HCL 5 MG PO TABS: 10.0000 mg | ORAL_TABLET | ORAL | Status: DC | PRN

## 2014-08-16 MED ORDER — TRAZODONE HCL 100 MG PO TABS
200.0000 mg | ORAL_TABLET | Freq: Every day | ORAL | Status: DC
  Administered 2014-08-16 – 2014-08-17 (×2): 200 mg via ORAL
  Filled 2014-08-16 (×3): qty 2

## 2014-08-16 MED ORDER — CIPROFLOXACIN HCL 500 MG PO TABS
500.0000 mg | ORAL_TABLET | Freq: Two times a day (BID) | ORAL | Status: DC
  Administered 2014-08-16 – 2014-08-18 (×4): 500 mg via ORAL
  Filled 2014-08-16 (×6): qty 1

## 2014-08-16 MED ORDER — ENSURE COMPLETE PO LIQD
237.0000 mL | Freq: Two times a day (BID) | ORAL | Status: DC
  Administered 2014-08-17 – 2014-08-18 (×4): 237 mL via ORAL

## 2014-08-16 MED ORDER — GUAIFENESIN-CODEINE 100-10 MG/5ML PO SOLN
10.0000 mL | Freq: Once | ORAL | Status: AC
  Administered 2014-08-16: 10 mL via ORAL
  Filled 2014-08-16: qty 10

## 2014-08-16 MED ORDER — IPRATROPIUM-ALBUTEROL 0.5-2.5 (3) MG/3ML IN SOLN
3.0000 mL | RESPIRATORY_TRACT | Status: DC
  Administered 2014-08-17 (×2): 3 mL via RESPIRATORY_TRACT
  Filled 2014-08-16 (×3): qty 3

## 2014-08-16 MED ORDER — OXYCODONE HCL 5 MG PO TABS
10.0000 mg | ORAL_TABLET | ORAL | Status: DC | PRN
  Administered 2014-08-16: 10 mg via ORAL
  Administered 2014-08-17 (×2): 20 mg via ORAL
  Administered 2014-08-17 – 2014-08-18 (×3): 10 mg via ORAL
  Administered 2014-08-18: 20 mg via ORAL
  Filled 2014-08-16: qty 2
  Filled 2014-08-16: qty 4
  Filled 2014-08-16 (×5): qty 2
  Filled 2014-08-16: qty 4

## 2014-08-16 MED ORDER — POLYETHYLENE GLYCOL 3350 17 G PO PACK: 17.0000 g | PACK | Freq: Every day | ORAL | Status: DC | PRN

## 2014-08-16 MED ORDER — GUAIFENESIN-CODEINE 100-10 MG/5ML PO SOLN
5.0000 mL | Freq: Four times a day (QID) | ORAL | Status: DC | PRN
  Administered 2014-08-17: 10 mL via ORAL
  Administered 2014-08-17 (×3): 5 mL via ORAL
  Administered 2014-08-18: 10 mL via ORAL
  Administered 2014-08-18: 5 mL via ORAL
  Filled 2014-08-16: qty 10
  Filled 2014-08-16 (×2): qty 5
  Filled 2014-08-16: qty 10
  Filled 2014-08-16: qty 5
  Filled 2014-08-16: qty 10
  Filled 2014-08-16: qty 5

## 2014-08-16 MED ORDER — SODIUM CHLORIDE 0.9 % IV BOLUS (SEPSIS)
500.0000 mL | Freq: Once | INTRAVENOUS | Status: AC
  Administered 2014-08-16: 500 mL via INTRAVENOUS

## 2014-08-16 MED ORDER — ONDANSETRON HCL 4 MG/2ML IJ SOLN: 4.0000 mg | Freq: Four times a day (QID) | INTRAMUSCULAR | Status: DC | PRN

## 2014-08-16 MED ORDER — ENOXAPARIN SODIUM 40 MG/0.4ML ~~LOC~~ SOLN
40.0000 mg | SUBCUTANEOUS | Status: DC
Start: 2014-08-16 — End: 2014-08-18
  Administered 2014-08-16 – 2014-08-17 (×2): 40 mg via SUBCUTANEOUS
  Filled 2014-08-16 (×3): qty 0.4

## 2014-08-16 MED ORDER — PREDNISONE 50 MG PO TABS
50.0000 mg | ORAL_TABLET | Freq: Every day | ORAL | Status: DC
  Administered 2014-08-17 – 2014-08-18 (×2): 50 mg via ORAL
  Filled 2014-08-16 (×3): qty 1

## 2014-08-16 MED ORDER — ALBUTEROL SULFATE (2.5 MG/3ML) 0.083% IN NEBU: 2.5000 mg | INHALATION_SOLUTION | RESPIRATORY_TRACT | Status: DC | PRN

## 2014-08-16 MED ORDER — CITALOPRAM HYDROBROMIDE 40 MG PO TABS
40.0000 mg | ORAL_TABLET | Freq: Every day | ORAL | Status: DC
  Administered 2014-08-17 – 2014-08-18 (×2): 40 mg via ORAL
  Filled 2014-08-16 (×2): qty 1

## 2014-08-16 MED ORDER — ONDANSETRON HCL 4 MG PO TABS: 4.0000 mg | ORAL_TABLET | Freq: Four times a day (QID) | ORAL | Status: DC | PRN

## 2014-08-16 MED ORDER — LEVOTHYROXINE SODIUM 50 MCG PO TABS
50.0000 ug | ORAL_TABLET | Freq: Every day | ORAL | Status: DC
  Administered 2014-08-17 – 2014-08-18 (×2): 50 ug via ORAL
  Filled 2014-08-16 (×3): qty 1

## 2014-08-16 MED ORDER — ACETAMINOPHEN 325 MG PO TABS: 650.0000 mg | ORAL_TABLET | Freq: Four times a day (QID) | ORAL | Status: DC | PRN

## 2014-08-16 MED ORDER — POTASSIUM CHLORIDE IN NACL 20-0.9 MEQ/L-% IV SOLN
INTRAVENOUS | Status: AC
  Administered 2014-08-16 – 2014-08-17 (×2): via INTRAVENOUS
  Filled 2014-08-16 (×2): qty 1000

## 2014-08-16 MED ORDER — CETYLPYRIDINIUM CHLORIDE 0.05 % MT LIQD
7.0000 mL | Freq: Two times a day (BID) | OROMUCOSAL | Status: DC
  Administered 2014-08-17 – 2014-08-18 (×3): 7 mL via OROMUCOSAL

## 2014-08-16 MED ORDER — FLUTICASONE PROPIONATE 50 MCG/ACT NA SUSP
2.0000 | Freq: Every day | NASAL | Status: DC
  Administered 2014-08-17 – 2014-08-18 (×2): 2 via NASAL
  Filled 2014-08-16: qty 16

## 2014-08-16 MED ORDER — ACETAMINOPHEN 650 MG RE SUPP: 650.0000 mg | Freq: Four times a day (QID) | RECTAL | Status: DC | PRN

## 2014-08-16 MED ORDER — BISACODYL 5 MG PO TBEC: 5.0000 mg | DELAYED_RELEASE_TABLET | Freq: Every day | ORAL | Status: DC | PRN

## 2014-08-16 MED ORDER — ALUM & MAG HYDROXIDE-SIMETH 200-200-20 MG/5ML PO SUSP: 30.0000 mL | Freq: Four times a day (QID) | ORAL | Status: DC | PRN

## 2014-08-16 MED ORDER — ALPRAZOLAM 0.5 MG PO TABS
0.5000 mg | ORAL_TABLET | Freq: Four times a day (QID) | ORAL | Status: DC | PRN
  Administered 2014-08-17 – 2014-08-18 (×3): 0.5 mg via ORAL
  Filled 2014-08-16 (×4): qty 1

## 2014-08-16 NOTE — ED Notes (Signed)
Lab draw delay, [place]

## 2014-08-16 NOTE — ED Notes (Signed)
Patient unable to ambulated due to her legs being to weak.

## 2014-08-16 NOTE — Progress Notes (Signed)
  CARE MANAGEMENT ED NOTE 08/16/2014  Patient:  Brooke [name],Brooke [name]   Account Number:  [id number]  Date Initiated:  08/16/2014  Documentation initiated by:  Radford PaxFERRERO,Cambria Osten  Subjective/Objective Assessment:   patient presents to ED with weakness to her lower extrmities     Subjective/Objective Assessment Detail:     Action/Plan:   Action/Plan Detail:   Anticipated DC Date:       Status Recommendation to Physician:   Result of Recommendation:    Other ED Services  Consult Working Plan    DC Planning Services  Other    Choice offered to / List presented to:  [name] Patient          Status of service:  Completed, signed off  ED Comments:   ED Comments Detail:  EDCM spoke to patient at bedside.  Patient lives at home alone.  Per chart review, patient being seen by [name].  EDCM called [name] and spoke to Camieele who reports patient has been a patient with [name] since 2013.  Camieele went on to say that a revaluation was done on the patient on 08/03/14 and patient was discharged from their services that day. Patient os currently without homehealth services at this time.  Patient's daughter is very supportive of patient. Per patient, her daughter does her grocery shopping, laundry, runs errands for her and she has a friend that lives nearby that does the same for her.  Patient reports she has a walker and a cane at home and expressed some interest in a rolator.  Patient reports she has recently been in contact with Marletta LorJulie Barr NP to visit her at home. Unclear as to if and when services will begin.  Patient reports she is able to complete her ADL's on her own. Patient has also expressed interest in home PT.  EDCM provided patient [place] and also a list of private duty nursing agencies.  EDCM explained to patient that private duty nursing agencies would be an out of pocket expense for her. Patient thankful for services.  No further EDCM needs at this time.

## 2014-08-16 NOTE — ED Notes (Signed)
Pt's diaper changed.

## 2014-08-16 NOTE — H&P (Addendum)
[facility] History and Physical  Brooke [name]:[id number] DOB: 1940/06/15 DOA: 08/16/2014  Referring physician:  Felicie [name] PCP:  [name],[name], MD   Chief Complaint:  weakness  HPI:  The patient is a 74 y.o. year-old female with history of mycobacterium avium complex, bronchiectasis followed by [name] on O2, hypothyroidism, protein calorie malnutrition who was discharged from hospice care 1 week ago because she gained two pounds who presents with lower extremity weakness.  She lives at home alone with support from her daughter who lives nearby.  The patient was last at their baseline health until a few days ago.  She has had progressive shortness of breath with increased cough productive of thick olive green sputum. Yesterday, she felt that she had increased swelling in her legs so she took a dose of Lasix.  She denies fevers, chills, dysuria, nausea, vomiting, diarrhea.  Today, she was sitting on the edge of the bed trying to stand up when she felt her legs give out from underneath her and she slid off the edge of the bed to the floor. She did not lose consciousness or hit her head. She denies any focal numbness, slurred speech, confusion. Both of her legs were equally weak. She denied any pain associated with weakness. She was unable to stand up although she tried for a long time. She eventually crawled towards the front door and put a blanket over herself and waited until help arrived. EMS transported her to the emergency department after her son-in-law found her.    In the emergency department, vital signs were stable. White blood cell count was 10.6, hemoglobin 10.8. CT of the head was unremarkable and chest x-ray showed no acute changes. Occult stool is negative. Urine culture is obtained but urinalysis is pending. Because she was unable to ambulate in the emergency department, she was seen by the social worker and the case manager. She declined going to assisted living facility and  it does not appear that the social worker attempted to find skilled nursing facility. The note reports the patient is still enrolled in hospice care however the patient states that she was discharged about a week and a half ago. We will try to clarify. She is being admitted because of generalized weakness and inability to ambulate.  Review of Systems:  General:  Denies fevers, chills, weight loss  HEENT:  Denies changes to hearing and vision, rhinorrhea, sinus congestion, sore throat CV:  Denies chest pain and palpitations, chronic lower extremity edema.  PULM:  Denies SOB, wheezing, cough.   GI:  Denies nausea, vomiting, constipation, diarrhea.   GU:  Denies dysuria, frequency, urgency ENDO:  Denies polyuria, polydipsia.   HEME:  Denies hematemesis, blood in stools, melena, abnormal bruising or bleeding.  LYMPH:  Denies lymphadenopathy.   MSK:  Chronic arthralgias, myalgias.   DERM:  Denies skin rash or ulcer.   NEURO:  Denies focal, weakness, slurred speech, confusion, facial droop.  PSYCH:  + anxiety, denies depression.    Past Medical History  Diagnosis Date  . Hypothyroidism   . History of allergy   . Mycobacterium avium complex   . Bronchiectasis     colonized mrsa, s/p vancomyin IV 10 days 5/10 and 7 days 6/10  . Pneumonia   . WJXBJYNW(295.6)    Past Surgical History  Procedure Laterality Date  . Cataract extraction, bilateral    . Abdominal hysterectomy    . Tonsillectomy      t and a   Social  History:  reports that she quit smoking about 52 years ago. Her smoking use included Cigarettes. She has a 3 pack-year smoking history. She has never used smokeless tobacco. She reports that she does not drink alcohol or use illicit drugs. Lives at home alone and ambulates with rolling walker   Allergies  Allergen Reactions  . Ambien [Zolpidem Tartrate]     Sleep walking and altered mental status  . Doxycycline     vomiting  . Levofloxacin Other (See Comments)    Unknown rxn.   Tolerates Cipro  . Mometasone Furoate Other (See Comments)     nasonex causes swollen throat, thrush  . Penicillins Other (See Comments)    Unknown reaction noted on hospice paperwork  . Sulfonamide Derivatives Other (See Comments)    Unknown reaction noted on hospice paperwork    Family History  Problem Relation Age of Onset  . Lung disease Father   . Lung disease Paternal Uncle   . Lung disease Paternal Uncle   . Lung disease Paternal Aunt   . Lung cancer Father      Prior to Admission medications   Medication Sig Start Date End Date Taking? Authorizing Provider  albuterol (PROVENTIL) (2.5 MG/3ML) 0.083% nebulizer solution Take 3 mLs (2.5 mg total) by nebulization every 6 (six) hours as needed (respiratory distress). 07/18/14  Yes Storm Frisk, MD  albuterol (VENTOLIN HFA) 108 (90 BASE) MCG/ACT inhaler Inhale 2 puffs into the lungs every 6 (six) hours as needed. 07/18/14  Yes Storm Frisk, MD  ALPRAZolam Prudy Feeler) 0.5 MG tablet Take 1 tablet (0.5 mg total) by mouth 4 (four) times daily as needed for anxiety. 08/25/13 08/25/14 Yes Storm Frisk, MD  Aspirin-Salicylamide-Caffeine [facility] HEADACHE POWDER PO) Take 1 tablet by mouth daily as needed (headache).    Yes Historical Provider, MD  citalopram (CELEXA) 40 MG tablet TAKE ONE (1) TABLET BY MOUTH EVERY DAY 06/12/14  Yes Storm Frisk, MD  fluticasone [facility]) 50 MCG/ACT nasal spray 2 sprays each nostril daily-hospice 06/05/14  Yes Storm Frisk, MD  furosemide (LASIX) 20 MG tablet Take 1 tablet (20 mg total) by mouth daily as needed. 07/18/14  Yes Storm Frisk, MD  guaiFENesin-codeine (CHERATUSSIN AC) 100-10 MG/5ML syrup Take 5-10 mLs by mouth every 6 (six) hours as needed for cough. 07/18/14  Yes Storm Frisk, MD  levothyroxine (SYNTHROID, LEVOTHROID) 100 MCG tablet Take 0.5 tablets (50 mcg total) by mouth daily before breakfast. 11/07/13  Yes Storm Frisk, MD  loperamide (IMODIUM A-D) 2 MG tablet Take 2 mg by mouth  daily as needed for diarrhea or loose stools (diarrhea).    Yes Historical Provider, MD  loratadine (CLARITIN) 10 MG tablet Take 10 mg by mouth daily as needed for allergies (allergies).    Yes Historical Provider, MD  naproxen (NAPROSYN) 250 MG tablet Take 1 tablet by mouth 2 (two) times daily. 07/16/14  Yes Historical Provider, MD  nystatin-triamcinolone (MYCOLOG II) cream Apply 1 application topically 2 (two) times daily as needed. To the affected area 07/24/14  Yes Storm Frisk, MD  Oxycodone HCl 10 MG TABS Take 1-2 tablets (10-20 mg total) by mouth every 4 (four) hours as needed (pain). 07/18/14  Yes Storm Frisk, MD  prochlorperazine (COMPAZINE) 10 MG tablet TAKE ONE (1) TABLET BY MOUTH EVERY 6 HOURS AS NEEDED FOR NAUSEA 03/28/14  Yes Storm Frisk, MD  QC SENNA-S 8.6-50 MG per tablet TAKE 1 TO 5 TABLETS BY MOUTH ONE TO  TWO TIMES DAILY AS NEEDED FOR CONSTIPATION   Yes Storm Frisk, MD  traZODone (DESYREL) 100 MG tablet Take 2 tablets (200 mg total) by mouth at bedtime. 07/18/14  Yes Storm Frisk, MD  triamcinolone (KENALOG) 0.025 % cream APPLY TO AFFECTED AREA THREE TIMES DAILYAS NEEDED 07/18/14  Yes Storm Frisk, MD   Physical Exam: Filed Vitals:   08/16/14 1800 08/16/14 1835 08/16/14 1845 08/16/14 1915  BP:  104/47    Pulse: 67 67 63 68  Temp:      TempSrc:      Resp: SpO2: 97% 96% 100% 96%     General:  Cachectic F, NAD  Eyes:  PERRL, anicteric, non-injected.  ENT:  Nares clear.  OP clear, non-erythematous without plaques or exudates.  Mildly dry MM.  Neck:  Supple without TM or JVD.    Lymph:  No cervical, supraclavicular, or submandibular LAD.  Cardiovascular:  RRR, normal S1, S2, without m/r/g.  2+ pulses, warm extremities  Respiratory:  Diminished breath sounds with course rales posteriorly, very course rales heard anteriorly, without increased WOB.  Abdomen:  NABS.  Soft, ND/NT.    Skin:  No rashes or focal lesions.  Musculoskeletal:   Normal bulk and tone.  Minimal bilateral LE edema.  Psychiatric:  A & O x 4.  Appropriate affect.  Neurologic:  CN 3-12 intact.  5/5 strength.  Sensation intact.  Labs on Admission:  Basic Metabolic Panel:  Recent Labs Lab 08/16/14 1420  NA 141  K 3.9  CL 103  CO2 32  GLUCOSE 90  BUN 22  CREATININE 0.87  CALCIUM 8.7   Liver Function Tests: No results for input(s): AST, ALT, ALKPHOS, BILITOT, PROT, ALBUMIN in the last 168 hours. No results for input(s): LIPASE, AMYLASE in the last 168 hours. No results for input(s): AMMONIA in the last 168 hours. CBC:  Recent Labs Lab 08/16/14 1420  WBC 10.6*  NEUTROABS 7.4  HGB 10.8*  HCT 33.3*  MCV 92.2  PLT 396   Cardiac Enzymes: No results for input(s): CKTOTAL, CKMB, CKMBINDEX, TROPONINI in the last 168 hours.  BNP (last 3 results) No results for input(s): BNP in the last 8760 hours.  ProBNP (last 3 results) No results for input(s): PROBNP in the last 8760 hours.  CBG: No results for input(s): GLUCAP in the last 168 hours.  Radiological Exams on Admission: Dg Chest 2 View  08/16/2014   CLINICAL DATA:  Weakness. Cough. History of "Pulmonary disease due to mycobacteria"  EXAM: CHEST  2 VIEW  COMPARISON:  07/05/2013  FINDINGS: Patient rotated right. Midline trachea. Normal heart size and mediastinal contours for age. Biapical pleural thickening. No pleural effusion or pneumothorax. Lower lobe predominant, right greater than left marked pulmonary interstitial thickening. Similar to slightly improved since the prior exam. No well-defined superimposed lobar consolidation. Volume loss in the right middle lobe and/or lingula, most apparent on the lateral view. Progressive.  Left lower lobe calcified granuloma.  IMPRESSION: Similar to slight improvement in lower lobe predominant marked interstitial thickening. Likely due to peribronchovascular interstitial opacities and possibly component of bronchiectasis. Presumably secondary to the  clinical history of mycobacterial infection, chronic. No convincing evidence of acute superimposed pneumonia.   Electronically Signed   By: Jeronimo Greaves M.D.   On: 08/16/2014 14:17   Ct Head Wo Contrast  08/16/2014   CLINICAL DATA:  Weakness and inability to walk due to weakness.  EXAM: CT HEAD WITHOUT CONTRAST  TECHNIQUE:  Contiguous axial images were obtained from the base of the skull through the vertex without intravenous contrast.  COMPARISON:  05/20/2006  FINDINGS: There is patchy low attenuation within the subcortical and periventricular white matter compatible with chronic microvascular disease. Prominence of the sulci and ventricles identified consistent with brain atrophy. No acute cortical infarct, hemorrhage, or mass lesion ispresent. No significant extra-axial fluid collection is present. The paranasal sinuses andmastoid air cells are clear. The osseous skull is intact.  IMPRESSION: 1. No acute intracranial abnormality. 2. Chronic microvascular disease and brain atrophy.   Electronically Signed   By: Signa Kellaylor  Stroud M.D.   On: 08/16/2014 16:33    EKG: Independently reviewed. NSR  Assessment/Plan Active Problems:   Weakness of both legs  ---  Generalized weakness may be secondary to a flare of bronchiectasis given increased SOB, cough, and sputum production.  No symptoms to suggest UTI.  She may also be dehydrated from taking lasix.  Intact reflexes, no loss of bowel or bladder control, and lack of history of malignancy make spinal cord compression seem less likely.   -  Admit to med-surg under OBS -  q4h neuro checks x 24 hours -  IVF -  Check magnesium and phosphorus -  Check CK and AST/ALT -  TSH -  Treat bronchiectasis with antibiotics, steroids, and breathing treatments -  PT/OT evaluation -  Case management to verify patient still enrolled in hospice -  SW consult in case she needs SNF placement -  If weakness persists, consider spine imaging  Bronchiectasis and MAC -   Followed by Dr. Delford FieldWright -  Sputum culture -  Start prednisone 50mg  daily -  Ciprofloxacin -  Start duonebs -  Keep O2 sat > 88%  Cachexia and severe protein-calorie malnutrition -  Nutrition consult -  Regular diet with supplements  Lower extremity edema, mild -  Suspect low albumin stores - check albumin level -  Duplex lower extremity negative -  Defer ECHO as will not change immediate management of patient's care  Leukocytosis, may be reactive from developing infection  Anemia of chronic disease, hemoglobin at baseline -  Occult stool negative   Sacral decubitus ulcers -  Wound care consult -  Foam dressing pending evaluation  Diet:  regular Access:  PIV IVF:  yes Proph:  lovenox  Code Status: DNR Family Communication: patient alone Disposition Plan: Admit to med-surg  Time spent: 60 min Sirus Labrie [facility] Pager [phone]  If 7PM-7AM, please contact night-coverage [email or web address] Password [email or web address] 08/16/2014, 8:13 PM

## 2014-08-16 NOTE — Progress Notes (Signed)
CSW met with pt at bedside. There was no family present. Pt informed CSW that she did not fall. Pt stated that her legs felt weak. Pt stated "I just couldn't use my legs. I got on my hands and knees and just crawled." Pt informed CSW that her legs are breaking out and are swollen. Pt lifted up her blanket to show CSW. It appears that pt has red spots on her legs.  Pt informed CSW that she lives alone in her apartment in [place]. Pt informed CSW that she completes her ADL's independently. Pt says that she has a good support system. Pt stated " My daughter takes good care of me, and I also have a friend ... Diane." Pt informed CSW that her daughter and her friend both live on the same street as her.  Pt states that she has not fallen within the last 6 months. Pt informed CSW that she is not interested in an ALF.  Pt does not have any questions at this time.  CSW consulted with Nurse CM, who states that the pt receives Hospice care.  Willette Brace [phone] ED CSW 08/16/2014 4:47 PM

## 2014-08-16 NOTE — Progress Notes (Signed)
ANTIBIOTIC CONSULT NOTE - INITIAL  Pharmacy Consult for Cipro  Indication: bronchiectasis flare  Allergies  Allergen Reactions  . Ambien [Zolpidem Tartrate]     Sleep walking and altered mental status  . Doxycycline     vomiting  . Levofloxacin Other (See Comments)    Unknown rxn.  Tolerates Cipro  . Mometasone Furoate Other (See Comments)     nasonex causes swollen throat, thrush  . Penicillins Other (See Comments)    Unknown reaction noted on hospice paperwork  . Sulfonamide Derivatives Other (See Comments)    Unknown reaction noted on hospice paperwork    Patient Measurements: Height: 5\' 3"  (160 cm) Weight: 106 lb 0.7 oz (48.1 kg) IBW/kg (Calculated) : 52.4  Vital Signs: Temp: 98.4 F (36.9 C) (03/10 2020) Temp Source: Oral (03/10 2020) BP: 110/73 mmHg (03/10 2020) Pulse Rate: 66 (03/10 2020) Intake/Output from previous day:   Intake/Output from this shift:    Labs:  Recent Labs  08/16/14 1420  WBC 10.6*  HGB 10.8*  PLT 396  CREATININE 0.87   Estimated Creatinine Clearance: 41.8 mL/min (by C-G formula based on Cr of 0.87). No results for input(s): VANCOTROUGH, VANCOPEAK, VANCORANDOM, GENTTROUGH, GENTPEAK, GENTRANDOM, TOBRATROUGH, TOBRAPEAK, TOBRARND, AMIKACINPEAK, AMIKACINTROU, AMIKACIN in the last 72 hours.   Microbiology: No results found for this or any previous visit (from the past 720 hour(s)).  Medical History: Past Medical History  Diagnosis Date  . Hypothyroidism   . History of allergy   . Mycobacterium avium complex   . Bronchiectasis     colonized mrsa, s/p vancomyin IV 10 days 5/10 and 7 days 6/10  . Pneumonia   . ZOXWRUEA(540.9Headache(784.0)     Assessment: [age] yoF with h/o MAC, bronchiectasis recently discharged from hospice care presents with lower extremity weakness and progressive shortness of breath with increased cough productive of thick green sputum. CXR showed no acute changes.  Pharmacy consulted to dose oral Cipro for possible  bronchiectasis flare.  Tmax: afebrile WBC: 10.6 Renal: SCr 0.87, CrCl~41 ml/min (CG)  Goal of Therapy:  Doses adjusted per renal function Eradication of infection  Plan:   Cipro 500 mg PO BID.  QTc 493 ms on admission EKG.Pt on Celexa 40 mg PTA.Recommend monitoring QTc with start of Cipro.  Clance Bollunyon, Delford Wingert 08/16/2014,9:33 PM

## 2014-08-16 NOTE — ED Provider Notes (Signed)
Sign out received at shift change.  Chronically ill 74 year old female with new onset of lower extremity weakness today.  Controlled fall at home, found next to her walker in the floor by EMS.  Clinically dehydrated.  CT head without acute abnormality.  Stable appearing chest film without convincing evidence of acute infectious process.   5:22 PM Venous duplex negative for DVT.  UA and hemoccult still pending.  Hemoccult negative.  Patient unable to stand or ambulate with assistance due to persistent weakness in legs.  Hospitalist contacted for admission.  Felicie Mornavid Kingdom Vanzanten, NP 08/17/14 [id number]  Mirian MoMatthew Gentry, MD 08/20/14 [id number]

## 2014-08-16 NOTE — Progress Notes (Signed)
VASCULAR LAB PRELIMINARY  PRELIMINARY  PRELIMINARY  PRELIMINARY  Left lower extremity venous duplex completed.    Preliminary report:  Left:  No evidence of DVT, superficial thrombosis, or Baker's cyst.  Brooke [name], RVT 08/16/2014, 5:13 PM

## 2014-08-16 NOTE — ED Notes (Signed)
Per EMS- Patient c/o weakness and inability to walk due to weakness. EMS found patient lying on the floor beside her walker. Patient reported that she was weak and had to lower herself to the floor before she fell. Stroke screen negative. No c/o head, neck, or back pain.

## 2014-08-16 NOTE — ED Notes (Signed)
Bed: WA17 Expected date:  Expected time:  Means of arrival:  Comments: EMS- elderly, BLE weakness

## 2014-08-16 NOTE — ED Provider Notes (Signed)
CSN: [id number]     Arrival date & time 08/16/14  1314 History   First MD Initiated Contact with Patient 08/16/14 1318     Chief Complaint  Patient presents with  . Fall  . Weakness     (Consider location/radiation/quality/duration/timing/severity/associated sxs/prior Treatment) The history is provided by the patient.     Pt with hx bronchiectasis, chronic pain, malnutrition p/w weakness that began today.  She was feeling well until this morning when she tried to get up and felt generally weak, with significant weakness in her bilateral lower extremities.  She was able to move them around in bed and crawl on the floor but she was unable to walk. Feels that her mouth is dry.  She has decreased appetite and states she often does not eat very much.  States that she has a chronic cough and SOB, with worsening green sputum.  She is lightheaded x months with standing up and with coughing.   Has had left leg swelling and pain x 2 weeks.  Denies fevers, chills, aches, back or abdominal pain, vomiting, diarrhea, urinary symptoms.  Denies urinary or bowel incontinence.  Denies fall.   Denies that the difficulty walking is secondary to pain.  She lives alone at home but has a daughter who lives 60 yards down the road who takes care of her.   Past Medical History  Diagnosis Date  . Hypothyroidism   . History of allergy   . Mycobacterium avium complex   . Bronchiectasis     colonized mrsa, s/p vancomyin IV 10 days 5/10 and 7 days 6/10  . Pneumonia   . NWGNFAOZ(308.6Headache(784.0)    Past Surgical History  Procedure Laterality Date  . Cataract extraction, bilateral    . Abdominal hysterectomy    . Tonsillectomy      t and a   No family history on file. History  Substance Use Topics  . Smoking status: Former Smoker -- 1.00 packs/day for 3 years    Types: Cigarettes    Quit date: 06/08/1962  . Smokeless tobacco: Never Used  . Alcohol Use: No   OB History    No data available     Review of Systems   All other systems reviewed and are negative.     Allergies  Ambien; Doxycycline; Levofloxacin; Mometasone furoate; Penicillins; and Sulfonamide derivatives  Home Medications   Prior to Admission medications   Medication Sig Start Date End Date Taking? Authorizing Provider  albuterol (PROVENTIL) (2.5 MG/3ML) 0.083% nebulizer solution Take 3 mLs (2.5 mg total) by nebulization every 6 (six) hours as needed (respiratory distress). 07/18/14   Storm FriskPatrick E Wright, MD  albuterol (VENTOLIN HFA) 108 (90 BASE) MCG/ACT inhaler Inhale 2 puffs into the lungs every 6 (six) hours as needed. 07/18/14   Storm FriskPatrick E Wright, MD  ALPRAZolam Prudy Feeler(XANAX) 0.5 MG tablet Take 1 tablet (0.5 mg total) by mouth 4 (four) times daily as needed for anxiety. 08/25/13 08/25/14  Storm FriskPatrick E Wright, MD  Aspirin-Salicylamide-Caffeine (BC HEADACHE POWDER PO) Take by mouth daily.    Historical Provider, MD  cholestyramine light (PREVALITE) 4 G packet MIX ONE PACKET (4 GRAMS TOTAL) WITH YOURCHOICE OF BEVERAGE AND DRINK TWICE DAILY Patient not taking: Reported on 07/18/2014 03/21/14   Storm FriskPatrick E Wright, MD  citalopram (CELEXA) 40 MG tablet TAKE ONE (1) TABLET BY MOUTH EVERY DAY 06/12/14   Storm FriskPatrick E Wright, MD  diphenoxylate-atropine (LOMOTIL) 2.5-0.025 MG per tablet TAKE ONE (1) TABLET BY MOUTH EVERY 6 HOURS AS NEEDED FOR  WATERY DIARRHEA 06/12/14   Storm Frisk, MD  fluticasone Aleda Grana) 50 MCG/ACT nasal spray 2 sprays each nostril daily-hospice 06/05/14   Storm Frisk, MD  furosemide (LASIX) 20 MG tablet Take 1 tablet (20 mg total) by mouth daily as needed. 07/18/14   Storm Frisk, MD  guaiFENesin-codeine (CHERATUSSIN AC) 100-10 MG/5ML syrup Take 5-10 mLs by mouth every 6 (six) hours as needed for cough. 07/18/14   Storm Frisk, MD  levothyroxine (SYNTHROID, LEVOTHROID) 100 MCG tablet Take 0.5 tablets (50 mcg total) by mouth daily before breakfast. 11/07/13   Storm Frisk, MD  loperamide (IMODIUM A-D) 2 MG tablet Take 2 mg by mouth  as needed for diarrhea or loose stools.    Historical Provider, MD  loratadine (CLARITIN) 10 MG tablet Take 10 mg by mouth daily as needed for allergies.    Historical Provider, MD  naproxen (NAPROSYN) 250 MG tablet Take 1 tablet by mouth 2 (two) times daily. 07/16/14   Historical Provider, MD  nystatin-triamcinolone (MYCOLOG II) cream Apply 1 application topically 2 (two) times daily as needed. To the affected area 07/24/14   Storm Frisk, MD  Oxycodone HCl 10 MG TABS Take 1-2 tablets (10-20 mg total) by mouth every 4 (four) hours as needed (pain). 07/18/14   Storm Frisk, MD  prochlorperazine (COMPAZINE) 10 MG tablet TAKE ONE (1) TABLET BY MOUTH EVERY 6 HOURS AS NEEDED FOR NAUSEA 03/28/14   Storm Frisk, MD  QC SENNA-S 8.6-50 MG per tablet TAKE 1 TO 5 TABLETS BY MOUTH ONE TO TWO TIMES DAILY AS NEEDED FOR CONSTIPATION    Storm Frisk, MD  senna (SENOKOT) 8.6 MG TABS Take 1 tablet by mouth daily as needed (constipation).    Historical Provider, MD  traZODone (DESYREL) 100 MG tablet Take 2 tablets (200 mg total) by mouth at bedtime. 07/18/14   Storm Frisk, MD  triamcinolone (KENALOG) 0.025 % cream Apply 1 application topically 3 (three) times daily as needed (psoriasis).    Historical Provider, MD  triamcinolone (KENALOG) 0.025 % cream APPLY TO AFFECTED AREA THREE TIMES DAILYAS NEEDED 07/18/14   Storm Frisk, MD   BP 106/55 mmHg  Pulse 67  Temp(Src) 97.6 F (36.4 C) (Oral)  Resp 19  SpO2 100% Physical Exam  Constitutional: She appears cachectic.  Non-toxic appearance. No distress.  HENT:  Head: Normocephalic and atraumatic.  Mouth/Throat: Mucous membranes are dry. No oropharyngeal exudate, posterior oropharyngeal edema or posterior oropharyngeal erythema.  Eyes: Conjunctivae are normal.  Neck: Normal range of motion. Neck supple.  Cardiovascular: Normal rate, regular rhythm and intact distal pulses.   Pulmonary/Chest: Effort normal and breath sounds normal. No respiratory  distress. She has no wheezes. She has no rales.  Abdominal: Soft. She exhibits no distension. There is no tenderness. There is no rebound and no guarding.  Musculoskeletal:  Pt moves all extremities, sensation intact.  She is able to lift and hold legs off of stretcher and has strength throughout lower extremity testing with exception of plantar flexion bilaterally, which is decreased.    LLE with mild edema and tenderness.    Neurological: She is alert. No cranial nerve deficit. She exhibits normal muscle tone.  Upper extremities:  Strength 5/5, sensation intact, distal pulses intact.     Skin: She is not diaphoretic.  Nursing note and vitals reviewed.   ED Course  Procedures (including critical care time) Labs Review Labs Reviewed  CBC WITH DIFFERENTIAL/PLATELET - Abnormal; Notable for the  following:    WBC 10.6 (*)    RBC 3.61 (*)    Hemoglobin 10.8 (*)    HCT 33.3 (*)    All other components within normal limits  BASIC METABOLIC PANEL - Abnormal; Notable for the following:    GFR calc non Af Amer 63 (*)    GFR calc Af Amer 73 (*)    All other components within normal limits  URINE CULTURE  URINALYSIS, ROUTINE W REFLEX MICROSCOPIC  OCCULT BLOOD X 1 CARD TO LAB, STOOL    Imaging Review Dg Chest 2 View  08/16/2014   CLINICAL DATA:  Weakness. Cough. History of "Pulmonary disease due to mycobacteria"  EXAM: CHEST  2 VIEW  COMPARISON:  07/05/2013  FINDINGS: Patient rotated right. Midline trachea. Normal heart size and mediastinal contours for age. Biapical pleural thickening. No pleural effusion or pneumothorax. Lower lobe predominant, right greater than left marked pulmonary interstitial thickening. Similar to slightly improved since the prior exam. No well-defined superimposed lobar consolidation. Volume loss in the right middle lobe and/or lingula, most apparent on the lateral view. Progressive.  Left lower lobe calcified granuloma.  IMPRESSION: Similar to slight improvement in  lower lobe predominant marked interstitial thickening. Likely due to peribronchovascular interstitial opacities and possibly component of bronchiectasis. Presumably secondary to the clinical history of mycobacterial infection, chronic. No convincing evidence of acute superimposed pneumonia.   Electronically Signed   By: Jeronimo Greaves M.D.   On: 08/16/2014 14:17     EKG Interpretation   Date/Time:  Thursday August 16 2014 13:31:51 EST Ventricular Rate:  68 PR Interval:  150 QRS Duration: 91 QT Interval:  464 QTC Calculation: 493 R Axis:   -12 Text Interpretation:  Sinus rhythm Probable left atrial enlargement Low  voltage, precordial leads Borderline prolonged QT interval Baseline wander  in lead(s) V6 No significant change since last tracing Confirmed by  POLLINA  MD, CHRISTOPHER [id number]) on 08/16/2014 3:31:46 PM       4:02 PM Case manager will also see the patient.  Pt also seen by social work.   Pt discussed with Dr Blinda Leatherwood who will also see the patient.   MDM   Final diagnoses:  Weakness of both lower extremities  Dehydration    Afebrile nontoxic patient with chronic malnutrition, chronic pain, cough p/w lower extremity weakness that she woke up with this morning.  States yesterday she was walking without difficulty.  She is clinically dry, IVF given.  Pt was unable to stand for orthostatics not because she was lightheaded but because she did not have strength in her legs.  Pt signed out to [name], NP, at change of shift, pending UA, Head CT, venous duples LLE, and admission to the hospital.      Trixie Dredge, PA-C 08/16/14 1633  Trixie Dredge, PA-C 08/16/14 1634  Gilda Crease, MD 08/16/14 [phone]

## 2014-08-17 ENCOUNTER — Observation Stay (HOSPITAL_COMMUNITY): Payer: Commercial Managed Care - HMO

## 2014-08-17 DIAGNOSIS — J471 Bronchiectasis with (acute) exacerbation: Secondary | ICD-10-CM

## 2014-08-17 DIAGNOSIS — R29898 Other symptoms and signs involving the musculoskeletal system: Secondary | ICD-10-CM | POA: Diagnosis not present

## 2014-08-17 DIAGNOSIS — E43 Unspecified severe protein-calorie malnutrition: Secondary | ICD-10-CM

## 2014-08-17 LAB — BASIC METABOLIC PANEL
Anion gap: 8 (ref 5–15)
BUN: 14 mg/dL (ref 6–23)
CALCIUM: 8.4 mg/dL (ref 8.4–10.5)
CHLORIDE: 106 mmol/L (ref 96–112)
CO2: 27 mmol/L (ref 19–32)
Creatinine, Ser: 0.79 mg/dL (ref 0.50–1.10)
GFR, EST NON AFRICAN AMERICAN: 79 mL/min — AB (ref >90)
Glucose, Bld: 83 mg/dL (ref 70–99)
Potassium: 3.7 mmol/L (ref 3.5–5.1)
Sodium: 141 mmol/L (ref 135–145)

## 2014-08-17 LAB — CBC
HEMATOCRIT: 30.7 % — AB (ref 36.0–46.0)
HEMOGLOBIN: 9.8 g/dL — AB (ref 12.0–15.0)
MCH: 29.2 pg (ref 26.0–34.0)
MCHC: 31.9 g/dL (ref 30.0–36.0)
MCV: 91.4 fL (ref 78.0–100.0)
Platelets: 382 10*3/uL (ref 150–400)
RBC: 3.36 MIL/uL — ABNORMAL LOW (ref 3.87–5.11)
RDW: 13.6 % (ref 11.5–15.5)
WBC: 8.5 10*3/uL (ref 4.0–10.5)

## 2014-08-17 LAB — URINALYSIS, ROUTINE W REFLEX MICROSCOPIC
Bilirubin Urine: NEGATIVE
Glucose, UA: NEGATIVE mg/dL
Hgb urine dipstick: NEGATIVE
Ketones, ur: NEGATIVE mg/dL
Leukocytes, UA: NEGATIVE
NITRITE: NEGATIVE
Protein, ur: NEGATIVE mg/dL
SPECIFIC GRAVITY, URINE: 1.016 (ref 1.005–1.030)
UROBILINOGEN UA: 0.2 mg/dL (ref 0.0–1.0)
pH: 7 (ref 5.0–8.0)

## 2014-08-17 LAB — URINE CULTURE
CULTURE: NO GROWTH
Colony Count: NO GROWTH

## 2014-08-17 LAB — HEPATIC FUNCTION PANEL
ALBUMIN: 3 g/dL — AB (ref 3.5–5.2)
ALT: 13 U/L (ref 0–35)
AST: 27 U/L (ref 0–37)
Alkaline Phosphatase: 102 U/L (ref 39–117)
Bilirubin, Direct: <0.1 mg/dL (ref 0.0–0.5)
TOTAL PROTEIN: 6.9 g/dL (ref 6.0–8.3)
Total Bilirubin: 0.1 mg/dL — ABNORMAL LOW (ref 0.3–1.2)

## 2014-08-17 LAB — MAGNESIUM: Magnesium: 1.6 mg/dL (ref 1.5–2.5)

## 2014-08-17 LAB — TSH: TSH: 1.39 u[IU]/mL (ref 0.350–4.500)

## 2014-08-17 LAB — CK: Total CK: 100 U/L (ref 7–177)

## 2014-08-17 LAB — PHOSPHORUS: Phosphorus: 3.1 mg/dL (ref 2.3–4.6)

## 2014-08-17 LAB — MRSA PCR SCREENING: MRSA by PCR: NEGATIVE

## 2014-08-17 MED ORDER — IPRATROPIUM-ALBUTEROL 0.5-2.5 (3) MG/3ML IN SOLN: 3.0000 mL | Freq: Four times a day (QID) | RESPIRATORY_TRACT | Status: DC | PRN

## 2014-08-17 MED ORDER — ASPIRIN EC 325 MG PO TBEC
325.0000 mg | DELAYED_RELEASE_TABLET | Freq: Every day | ORAL | Status: DC
  Administered 2014-08-17 – 2014-08-18 (×2): 325 mg via ORAL
  Filled 2014-08-17 (×2): qty 1

## 2014-08-17 NOTE — Progress Notes (Signed)
CARE MANAGEMENT NOTE 08/17/2014  Patient:  Brooke [name],Brooke [name]   Account Number:  [id number]  Date Initiated:  08/17/2014  Documentation initiated by:  Ferdinand CavaSCHETTINO,Jayli Fogleman  Subjective/Objective Assessment:   74 yo female admitted with possible cellulitis of buttock wound     Action/Plan:   discharge plannning   Anticipated DC Date:  08/18/2014   Anticipated DC Plan:  HOME W HOME HEALTH SERVICES      DC Planning Services  CM consult      [facility]   Choice offered to / List presented to:  Brooke [name] Patient           HH agency  [facility]   Status of service:  In process, will continue to follow Medicare Important Message given?   (If response is "NO", the following Medicare IM given date fields will be blank) Date Medicare IM given:   Medicare IM given by:   Date Additional Medicare IM given:   Additional Medicare IM given by:    Discharge Disposition:  HOME W HOME HEALTH SERVICES  Per UR Regulation:    If discussed at Long Length of Stay Meetings, dates discussed:    Comments:  08/17/14 Ferdinand CavaAndrea Schettino RN BSN CM [phone] Re[facility] and she stated that the hospice MD stated she is not eligible for home services. Explained this to the patient and she still has many questions regarding hospice eligibility. Encouraged her to make appointment with PCP, Marletta LorJulie Barr NP, with [facility] and discuss hospice with her and have questions answered. Patient requested rollator but explained that she has had her current walker less than 5 years and if she wants a rollator at this time she will have to purchase out of pocket. Also discussed recommended HH services and patient is agreeable to PT, RN, and aide. Offered to call her daughter Brooke [name] to discuss provided information but she declined. Await discharge orders.  08/17/14 Ferdinand CavaAndrea Schettino RN BSN CM [phone] Spoke with patient regarding MD consult and [facility]. [facility] stated that [facility] and she would like [facility]. She stated that she was discharged from hospice services earlier this month because she gained weight but now that she is in the hospital she is hopeful she is eligible for hospice services. This CM spoke with the attending MD regarding patient wish and then contacted [facility] request fro [facility] services. Misty StanleyLisa stated that [name] will review the patient's information and call back with results of eligibility. The patient stated that if she cannot have hospice, she does not want HH because she is not able to afford the copay and they "don't come very often and are not long term". Patient stated that her daughter Brooke [name] lives nearby and is a big support and her friend Brooke [name]. Patient stated she has home O2 that she wears prn but has concentrator and portable tanks, a rolling walker, shower chair, and a nebulizer machine. The patient would like a rollator (walker with seat) and will pay out of pocket if necessary. Will follow up regarding patient's eligibility for home  hospice care.

## 2014-08-17 NOTE — Progress Notes (Signed)
UR completed 

## 2014-08-17 NOTE — Evaluation (Signed)
Physical Therapy Evaluation Patient Details Name: Brooke [name] MRN: [id number] DOB: 1940-09-07 Today's Date: 08/17/2014   History of Present Illness  The patient is a 74 y.o. year-old female with history of mycobacterium avium complex, bronchiectasis followed by [name] on O2, hypothyroidism, protein calorie malnutrition who was discharged from hospice care 1 week ago because she gained two pounds who presents with lower extremity weakness.  Clinical Impression  Pt admitted with above diagnosis. Pt currently with functional limitations due to the deficits listed below (see PT Problem List). Pt ambulated 120' with RW and min assist. Distance limited by LLE weakness/pain. Pt not agreeable to ST-SNF, she states she'll have daily assist from friends/family at home. HHPT recommended.  Pt will benefit from skilled PT to increase their independence and safety with mobility to allow discharge to the venue listed below.       Follow Up Recommendations Home health PT    Equipment Recommendations  3in1 (PT)    Recommendations for Other Services       Precautions / Restrictions Precautions Precautions: Fall Restrictions Weight Bearing Restrictions: No      Mobility  Bed Mobility Overal bed mobility: Modified Independent             General bed mobility comments: used rail  Transfers Overall transfer level: Needs assistance Equipment used: Rolling walker (2 wheeled) Transfers: Sit to/from Stand Sit to Stand: Min assist         General transfer comment: increased time and effort, cues for hand placement  Ambulation/Gait Ambulation/Gait assistance: Min guard;Min assist Ambulation Distance (Feet): 120 Feet Assistive device: Rolling walker (2 wheeled) Gait Pattern/deviations: Step-through pattern   Gait velocity interpretation: Below normal speed for age/gender General Gait Details: distance limited by LLE pain/weakness, LLE appeared to buckle several times, pt reported  she's walking better today than she has in past several days  Stairs            Wheelchair Mobility    Modified Rankin (Stroke Patients Only)       Balance Overall balance assessment: Needs assistance Sitting-balance support: Feet supported Sitting balance-Leahy Scale: Good       Standing balance-Leahy Scale: Fair                               Pertinent Vitals/Pain Pain Assessment: 0-10 Pain Score: 8  Pain Location: LLE with walking Pain Descriptors / Indicators: Sore Pain Intervention(s): Limited activity within patient's tolerance;Monitored during session;Premedicated before session    Home Living Family/patient expects to be discharged to:: Private residence Living Arrangements: Alone Available Help at Discharge: Friend(s);Available PRN/intermittently Type of Home: Apartment Home Access: Stairs to enter   Entrance Stairs-Number of Steps: 12 Home Layout: One level Home Equipment: Walker - 2 wheels;Shower seat;Other (comment) (oxygen prn) Additional Comments: pt states she has friends and family that can check in daily, she states she was able to go up/down flight of stairs but they made her SOB and rarely leaves her apt    Prior Function Level of Independence: Needs assistance   Gait / Transfers Assistance Needed: uses RW at times, walks short distances, rarely leaves 2nd floor apt, family does laundry/shopping  ADL's / Homemaking Assistance Needed: tub shower  Comments: h/o hypotension, pt reports she frequently gets lightheaded     Hand Dominance        Extremity/Trunk Assessment  Lower Extremity Assessment: LLE deficits/detail   LLE Deficits / Details: ankle DF -3/5, knee extension +3/5  Cervical / Trunk Assessment: Normal  Communication   Communication: No difficulties  Cognition Arousal/Alertness: Awake/alert Behavior During Therapy: WFL for tasks assessed/performed Overall Cognitive Status: Within Functional  Limits for tasks assessed                      General Comments      Exercises        Assessment/Plan    PT Assessment Patient needs continued PT services  PT Diagnosis Difficulty walking;Generalized weakness   PT Problem List Decreased strength;Decreased activity tolerance;Decreased balance;Pain;Decreased mobility  PT Treatment Interventions DME instruction;Gait training;Stair training;Functional mobility training;Therapeutic activities;Patient/family education;Therapeutic exercise   PT Goals (Current goals can be found in the Care Plan section) Acute Rehab PT Goals Patient Stated Goal: to go home to her cat PT Goal Formulation: With patient Time For Goal Achievement: 08/31/14 Potential to Achieve Goals: Good    Frequency Min 3X/week   Barriers to discharge        Co-evaluation               End of Session Equipment Utilized During Treatment: Gait belt Activity Tolerance: Patient limited by fatigue Patient left: in chair;with call bell/phone within reach Nurse Communication: Mobility status    Functional Assessment Tool Used: clinical judgement Functional Limitation: Mobility: Walking and moving around Mobility: Walking and Moving Around Current Status [phone](G8978): At least 20 percent but less than 40 percent impaired, limited or restricted Mobility: Walking and Moving Around Goal Status [phone](G8979): At least 1 percent but less than 20 percent impaired, limited or restricted    Time: 1057-1141 PT Time Calculation (min) (ACUTE ONLY): 44 min   Charges:   PT Evaluation $Initial PT Evaluation Tier I: 1 Procedure PT Treatments $Gait Training: 8-22 mins $Therapeutic Activity: 8-22 mins   PT G Codes:   PT G-Codes **NOT FOR INPATIENT CLASS** Functional Assessment Tool Used: clinical judgement Functional Limitation: Mobility: Walking and moving around Mobility: Walking and Moving Around Current Status ([id number](G8978): At least 20 percent but less than 40 percent  impaired, limited or restricted Mobility: Walking and Moving Around Goal Status [phone](G8979): At least 1 percent but less than 20 percent impaired, limited or restricted    Tamala SerUhlenberg, Dionisia Pacholski Kistler 08/17/2014, 11:51 AM [phone]

## 2014-08-17 NOTE — Progress Notes (Signed)
Clinical Social Work  CSW received inappropriate referral to assist with SNF placement. CSW reviewed chart which stated PT recommends HH. CM aware and will assist as needed. CSW is signing off but available if further needs arise.  Country Club[place]olly Ellyson R[place], [place] [phone]

## 2014-08-17 NOTE — Evaluation (Signed)
Occupational Therapy Evaluation Patient Details Name: Florian BuffSandy C Brundage MRN: [id number] DOB: 1941/01/14 Today's Date: 08/17/2014    History of Present Illness The patient is a 74 y.o. year-old female with history of mycobacterium avium complex, bronchiectasis, on O2, hypothyroidism, and protein calorie malnutrition.  She was recently discharged from hospice care.  Pt  presents with lower extremity weakness.   Clinical Impression   Pt was admitted for the above.  She will benefit from skilled OT to increase safety and independence with ADLs. She is overall min A at this time but she is a HIGH FALLS RISK as she lost balance twice during OT session.  Strongly recommended 24/7 as pt does not want to go to SNF.  Pt was independent with adls prior to admission. Goals in acute are for min guard for safety    Follow Up Recommendations  Supervision/Assistance - 24 hour;Home health OT (vs. SNF--pt is refusing this option)    Equipment Recommendations  3 in 1 bedside comode (if agreeable)    Recommendations for Other Services       Precautions / Restrictions Precautions Precautions: Fall Restrictions Weight Bearing Restrictions: No      Mobility Bed Mobility Overal bed mobility: Modified Independent             General bed mobility comments: extra time, used UEs to get LLE back in bed  Transfers Overall transfer level: Needs assistance Equipment used: Rolling walker (2 wheeled) Transfers: Sit to/from Stand Sit to Stand: Min assist         General transfer comment: cues for UE placement    Balance Overall balance assessment: Needs assistance Sitting-balance support: Feet supported Sitting balance-Leahy Scale: Good       Standing balance-Leahy Scale: Poor                              ADL Overall ADL's : Needs assistance/impaired     Grooming: Oral care;Minimal assistance;Standing       Lower Body Bathing: Minimal assistance;Sit to/from stand        Lower Body Dressing: Minimal assistance;Sit to/from stand   Toilet Transfer: Minimal assistance;Ambulation;Comfort height toilet             General ADL Comments: Pt had 2 LOB when walking to bathroom and standing at sink for grooming.  She can perform UB adls with set up.  Recommended that she have someone with her 24/7 if she goes home due to falls risk.       Vision     Perception     Praxis      Pertinent Vitals/Pain Pain Assessment: 0-10 Pain Score: 5  Pain Location: LLE Pain Descriptors / Indicators: Sore;Numbness Pain Intervention(s): Limited activity within patient's tolerance;Monitored during session;Premedicated before session;Repositioned     Hand Dominance     Extremity/Trunk Assessment Upper Extremity Assessment Upper Extremity Assessment: Overall WFL for tasks assessed      Cervical / Trunk Assessment Cervical / Trunk Assessment: Normal   Communication Communication Communication: HOH   Cognition Arousal/Alertness: Awake/alert Behavior During Therapy: WFL for tasks assessed/performed Overall Cognitive Status: No family/caregiver present to determine baseline cognitive functioning (sustained attention. Pt talks a lot and self distracts)                     General Comments       Exercises       Shoulder Instructions  Home Living Family/patient expects to be discharged to:: Private residence Living Arrangements: Alone Available Help at Discharge: Friend(s);Available PRN/intermittently Type of Home: Apartment Home Access: Stairs to enter Entrance Stairs-Number of Steps: 12   Home Layout: One level     Bathroom Shower/Tub: Tub/shower unit Shower/tub characteristics: Curtain Firefighter: Standard     Home Equipment: Environmental consultant - 2 wheels;Shower seat;Other (comment)   Additional Comments: she has had someone check on her daily. She had a BSC but gave it away      Prior Functioning/Environment Level of Independence:  Needs assistance  Gait / Transfers Assistance Needed: uses RW at times, walks short distances, rarely leaves 2nd floor apt, family does laundry/shopping ADL's / Homemaking Assistance Needed: tub shower   Comments: h/o hypotension, pt reports she frequently gets lightheaded    OT Diagnosis: Generalized weakness;Acute pain   OT Problem List: Decreased strength;Decreased activity tolerance;Pain;Decreased knowledge of use of DME or AE;Decreased cognition;Decreased safety awareness;Impaired balance (sitting and/or standing)   OT Treatment/Interventions: Self-care/ADL training;DME and/or AE instruction;Patient/family education;Balance training;Cognitive remediation/compensation    OT Goals(Current goals can be found in the care plan section) Acute Rehab OT Goals Patient Stated Goal: to go home to her cat OT Goal Formulation: With patient Time For Goal Achievement: 08/24/14 Potential to Achieve Goals: Good ADL Goals Pt Will Transfer to Toilet: with min guard assist;bedside commode;ambulating Additional ADL Goal #1: Pt will complete LB adls with set up, supervision, sit to stand Additional ADL Goal #2: Pt will identify modifications to increase safety (shower seat, seat at sink, sitting rest breaks)  OT Frequency: Min 2X/week   Barriers to D/C:            Co-evaluation              End of Session    Activity Tolerance: Patient limited by fatigue Patient left: in bed;with call bell/phone within reach;with bed alarm set   Time: 1610-9604 OT Time Calculation (min): 28 min Charges:  OT General Charges $OT Visit: 1 Procedure OT Evaluation $Initial OT Evaluation Tier I: 1 Procedure OT Treatments $Self Care/Home Management : 8-22 mins G-Codes: OT G-codes **NOT FOR INPATIENT CLASS** Functional Assessment Tool Used: clinical observation and judgment Functional Limitation: Self care Self Care Current Status (V4098): At least 20 percent but less than 40 percent impaired, limited or  restricted Self Care Goal Status (J1914): At least 1 percent but less than 20 percent impaired, limited or restricted  Caydence Koenig 08/17/2014, 1:11 PM   Marica Otter, OTR/L [phone] 08/17/2014

## 2014-08-17 NOTE — Progress Notes (Signed)
INITIAL NUTRITION ASSESSMENT  DOCUMENTATION CODES Per approved criteria  -Severe malnutrition in the context of chronic illness  Pt meets criteria for severe malnutrition in the context of chronic illness as evidenced by <75% energy intake for >1 month and severe subcutaneous body fat and muscle mass depletion.  INTERVENTION: Provide Ensure BID, each provides 350 kcal, 13 grams of protein and 180 ml free water  Encourage PO intake  NUTRITION DIAGNOSIS: Inadequate oral intake related to poor appetite as evidenced by decreased PO intake.   Goal: Pt to meet >/= 90% of estimated energy needs  Monitor:  PO intake, weight trends, I/O's, labs  Reason for Assessment: C/s for assessment of nutrition requirements/status   74 y.o. female  Admitting Dx: <principal problem not specified>  ASSESSMENT: 74 y/o female with history of mycobacterium avium complex, bronchiectasis, hypothyroidism, protein calorie malnutrition was recently discharged from hospice care 1 week ago because she gained 2 pounds. Pt presented with lower extremity weakness.  Labs and medications reviewed. Pt reported she does not eat much throughout the day, and has never been a big eater. She reported her usual body weight is 98 lb. Even though she has gained some weight, she is still malnourished. Needs to increase PO intake. Pt denied any nausea, vomiting or abdominal pain. Her current PO intake is <50% at this time. She did not enjoy her breakfast this morning. She also wants her Ensure to be refrigerated; she does not want to be offered TID. Pt was not interested in snacks, ice cream or other supplements. Continue to encourage PO intake and provide Ensure BID.  Nutrition Focused Physical Exam:  Subcutaneous Fat:  Orbital Region: severe Upper Arm Region: severe Thoracic and Lumbar Region: severe  Muscle:  Temple Region: severe Clavicle Bone Region: severe Clavicle and Acromion Bone Region: severe Scapular Bone  Region: severe Dorsal Hand: severe Patellar Region: severe Anterior Thigh Region: severe Posterior Calf Region: severe  Edema: not present   Height: Ht Readings from Last 1 Encounters:  08/16/14 5\' 3"  (1.6 m)    Weight: Wt Readings from Last 1 Encounters:  08/16/14 106 lb 0.7 oz (48.1 kg)    Ideal Body Weight: 115 lb (52.3 kg)  % Ideal Body Weight: 92%  Wt Readings from Last 10 Encounters:  08/16/14 106 lb 0.7 oz (48.1 kg)  07/18/14 102 lb (46.267 kg)  07/05/13 105 lb (47.628 kg)  10/05/12 98 lb 11.2 oz (44.77 kg)  03/10/12 98 lb (44.453 kg)  12/08/11 99 lb 12.8 oz (45.269 kg)  10/23/11 100 lb (45.36 kg)  10/14/11 103 lb 3.2 oz (46.811 kg)  09/28/11 106 lb 4.8 oz (48.217 kg)  09/18/11 101 lb (45.813 kg)    Usual Body Weight: 98 lb (44.77 kg)  % Usual Body Weight: 108%  BMI:  Body mass index is 18.79 kg/(m^2).  Estimated Nutritional Needs: Kcal: 1350-1550 Protein: 50-65 grams Fluid: >/=1.5L daily   Skin: stage II pressure ulcer left buttock  Diet Order: Diet regular  EDUCATION NEEDS: -No education needs identified at this time   Intake/Output Summary (Last 24 hours) at 08/17/14 0935 Last data filed at 08/17/14 0600  Gross per 24 hour  Intake 781.67 ml  Output      0 ml  Net 781.67 ml    Last BM: pta   Labs:   Recent Labs Lab 08/16/14 1420 08/16/14 2231 08/17/14 0540  NA 141  --  141  K 3.9  --  3.7  CL 103  --  106  CO2 32  --  27  BUN 22  --  14  CREATININE 0.87  --  0.79  CALCIUM 8.7  --  8.4  MG  --  1.6  --   PHOS  --  3.1  --   GLUCOSE 90  --  83    CBG (last 3)  No results for input(s): GLUCAP in the last 72 hours.  Scheduled Meds: . antiseptic oral rinse  7 mL Mouth Rinse q12n4p  . chlorhexidine  15 mL Mouth Rinse BID  . ciprofloxacin  500 mg Oral BID  . citalopram  40 mg Oral Daily  . enoxaparin (LOVENOX) injection  40 mg Subcutaneous Q24H  . feeding supplement (ENSURE COMPLETE)  237 mL Oral BID BM  . fluticasone  2  spray Each Nare Daily  . levothyroxine  50 mcg Oral QAC breakfast  . naproxen  250 mg Oral BID  . predniSONE  50 mg Oral Q breakfast  . traZODone  200 mg Oral QHS    Continuous Infusions: . 0.9 % NaCl with KCl 20 mEq / L 100 mL/hr at 08/17/14 1610    Past Medical History  Diagnosis Date  . Hypothyroidism   . History of allergy   . Mycobacterium avium complex   . Bronchiectasis     colonized mrsa, s/p vancomyin IV 10 days 5/10 and 7 days 6/10  . Pneumonia   . RUEAVWUJ(811.9)     Past Surgical History  Procedure Laterality Date  . Cataract extraction, bilateral    . Abdominal hysterectomy    . Tonsillectomy      t and a    Cristela Felt, MS Dietetic Intern Pager: [phone]

## 2014-08-17 NOTE — Consult Note (Signed)
WOC wound consult note Reason for Consult: evaluate sacrum Wound type: Stage II Pressure ulcer left buttock  Pressure Ulcer POA: Yes Measurement: 1cm x 1cm x 0.1cm  Wound KZS:WFUXNATbed:partial thickness skin loss, pink, moist Drainage (amount, consistency, odor) not able to assess due to dressing saturated with urine at the time of my assessment Periwound: intact, one area on the right buttock appear to be healed at this time  Dressing procedure/placement/frequency: Silicone foam dressing to cover just the wound, change every 3 days and PRN soilage.   Discussed POC with patient and bedside nurse.  Re consult if needed, will not follow at this time. Thanks  Alene Bergerson [employer] RN, CWOCN [phone])

## 2014-08-17 NOTE — Progress Notes (Signed)
[facility] PROGRESS NOTE  Brooke [name]:[id number] DOB: [birth year] DOA: 08/16/2014 PCP: Louanna Raw, MD  Brief Summary  The patient is a 74 y.o. year-old female with history of mycobacterium avium complex, bronchiectasis followed by [name] on O2, hypothyroidism, protein calorie malnutrition who was discharged from hospice care 1 week ago because she gained two pounds, who presented with lower extremity weakness. She lives at home alone with support from her daughter who lives nearby. The patient had progressive shortness of breath with cough productive of thick olive green sputum.  On the day of admission, she was sitting on the edge of the couch trying to stand up when she felt her legs give out from underneath her and she slid off the edge of the couch to the floor.  She did not lose consciousness or hit her head.  At the time of admission, she denied focal numbness, slurred speech, confusion and stated that both of her legs were equally weak.  Today, she reported that although initially both legs were weak, her right leg recovered quickly, but her left leg continues to have weakness, now also with muscle pain.  She was treated for possible superinfection of bronchiectasis with steroids and ciprofloxacin.  Her strength has recovered some and she was able to ambulate with PT but with persistent weakness in the left leg.  Due to finances, she declines SNF.     Assessment/Plan  Generalized weakness may be secondary to a flare of bronchiectasis causing hypoxemia, dehydration, and deconditioning.  Stroke was considered unlikely initially based on history and exam, however, patient has amended her story to say she had unilateral weakness and has asymmetric reflexes today.    - q4h neuro checks  - magnesium and phosphorus wnl - Check CK and AST/ALT wnl - TSH 1.39 - Treating bronchiectasis with antibiotics, steroids, and breathing treatments - PT/OT:  Recommending SNF, patient high  falls risk, but declines because she cannot afford SNF - Not a candidate for hospice - MRI brain to exclude stroke -  ASA -  Swallow eval  Bronchiectasis and MAC - Followed by [name] - Sputum culture - Continue prednisone  daily - Ciprofloxacin day 2 - Continue duonebs - Keep O2 sat > 88%  Cachexia and severe protein-calorie malnutrition - Nutrition consult - Regular diet with supplements  Lower extremity edema, mild - Suspect low albumin stores - check albumin level - Duplex lower extremity negative - Defer ECHO as will not change immediate management of patient's care  Leukocytosis, may be reactive from developing infection  Anemia of chronic disease, hemoglobin at baseline - Occult stool negative   Sacral decubitus ulcers - Wound care consult - Foam dressing pending evaluation  Diet:  regular Access:  PIV IVF:  off Proph:  lovenox  Code Status: DNR Family Communication: patient alone Disposition Plan: to home when medically ready   Consultants: none Procedures:  CT head  Antibiotics:  cipro 3/10 >>   HPI/Subjective:  STates she has left lower extremity pain and weakness which is persistent.  Was happy that she was able to ambulate.    Objective: Filed Vitals:   08/17/14 0108 08/17/14 0420 08/17/14 0520 08/17/14 1508  BP:   95/59 106/39  Pulse:   75 63  Temp:   98.1 F (36.7 C) 98.2 F (36.8 C)  TempSrc:   Oral Oral  Resp:   20 18  Height:      Weight:      SpO2: 96% 97% 94% 92%  Intake/Output Summary (Last 24 hours) at 08/17/14 1728 Last data filed at 08/17/14 1300  Gross per 24 hour  Intake 1001.67 ml  Output      0 ml  Net 1001.67 ml   Filed Weights   08/16/14 2020  Weight: 48.1 kg (106 lb 0.7 oz)    Exam:   General:  Cachectic female, No acute distress  HEENT:  NCAT, MMM  Cardiovascular:  RRR, nl S1, S2 no mrg, 2+ pulses, warm extremities  Respiratory:  Course rales bilaterally throughout, no  increased WOB  Abdomen:   NABS, soft, NT/ND  MSK:   Decreased tone and bulk, no LEE  Neuro:  Diminished patellar reflex on right, 2+ on left.  Stiff in left leg, unable to relax leg very well, almost feels contracted in ankle.  Pain in left quad and calf.  4/5 strength hips and knees bilaterally, but then 4/5 left ankle and 5/5 right ankle dorsi/plantar flexion  Data Reviewed: Basic Metabolic Panel:  Recent Labs Lab 08/16/14 1420 08/16/14 2231 08/17/14 0540  NA 141  --  141  K 3.9  --  3.7  CL 103  --  106  CO2 32  --  27  GLUCOSE 90  --  83  BUN 22  --  14  CREATININE 0.87  --  0.79  CALCIUM 8.7  --  8.4  MG  --  1.6  --   PHOS  --  3.1  --    Liver Function Tests:  Recent Labs Lab 08/16/14 2231  AST 27  ALT 13  ALKPHOS 102  BILITOT 0.1*  PROT 6.9  ALBUMIN 3.0*   No results for input(s): LIPASE, AMYLASE in the last 168 hours. No results for input(s): AMMONIA in the last 168 hours. CBC:  Recent Labs Lab 08/16/14 1420 08/17/14 0540  WBC 10.6* 8.5  NEUTROABS 7.4  --   HGB 10.8* 9.8*  HCT 33.3* 30.7*  MCV 92.2 91.4  PLT 396 382   Cardiac Enzymes:  Recent Labs Lab 08/16/14 2231  CKTOTAL 100   BNP (last 3 results) No results for input(s): BNP in the last 8760 hours.  ProBNP (last 3 results) No results for input(s): PROBNP in the last 8760 hours.  CBG: No results for input(s): GLUCAP in the last 168 hours.  No results found for this or any previous visit (from the past 240 hour(s)).   Studies: Dg Chest 2 View  08/16/2014   CLINICAL DATA:  Weakness. Cough. History of "Pulmonary disease due to mycobacteria"  EXAM: CHEST  2 VIEW  COMPARISON:  07/05/2013  FINDINGS: Patient rotated right. Midline trachea. Normal heart size and mediastinal contours for age. Biapical pleural thickening. No pleural effusion or pneumothorax. Lower lobe predominant, right greater than left marked pulmonary interstitial thickening. Similar to slightly improved since the prior  exam. No well-defined superimposed lobar consolidation. Volume loss in the right middle lobe and/or lingula, most apparent on the lateral view. Progressive.  Left lower lobe calcified granuloma.  IMPRESSION: Similar to slight improvement in lower lobe predominant marked interstitial thickening. Likely due to peribronchovascular interstitial opacities and possibly component of bronchiectasis. Presumably secondary to the clinical history of mycobacterial infection, chronic. No convincing evidence of acute superimposed pneumonia.   Electronically Signed   By: Jeronimo Greaves M.D.   On: 08/16/2014 14:17   Ct Head Wo Contrast  08/16/2014   CLINICAL DATA:  Weakness and inability to walk due to weakness.  EXAM: CT HEAD WITHOUT CONTRAST  TECHNIQUE:  Contiguous axial images were obtained from the base of the skull through the vertex without intravenous contrast.  COMPARISON:  05/20/2006  FINDINGS: There is patchy low attenuation within the subcortical and periventricular white matter compatible with chronic microvascular disease. Prominence of the sulci and ventricles identified consistent with brain atrophy. No acute cortical infarct, hemorrhage, or mass lesion ispresent. No significant extra-axial fluid collection is present. The paranasal sinuses andmastoid air cells are clear. The osseous skull is intact.  IMPRESSION: 1. No acute intracranial abnormality. 2. Chronic microvascular disease and brain atrophy.   Electronically Signed   By: Signa Kellaylor  Stroud M.D.   On: 08/16/2014 16:33    Scheduled Meds: . antiseptic oral rinse  7 mL Mouth Rinse q12n4p  . chlorhexidine  15 mL Mouth Rinse BID  . ciprofloxacin  500 mg Oral BID  . citalopram  40 mg Oral Daily  . enoxaparin (LOVENOX) injection  40 mg Subcutaneous Q24H  . feeding supplement (ENSURE COMPLETE)  237 mL Oral BID BM  . fluticasone  2 spray Each Nare Daily  . levothyroxine  50 mcg Oral QAC breakfast  . naproxen  250 mg Oral BID  . predniSONE  50 mg Oral Q  breakfast  . traZODone  200 mg Oral QHS   Continuous Infusions:   Active Problems:   BRONCHIECTASIS   Severe protein-calorie malnutrition   Chronic respiratory failure   Weakness of both legs   Leukocytosis   Sacral decubitus ulcer    Time spent: 30 min    Allizon Woznick  [facility] Pager [phone]. If 7PM-7AM, please contact night-coverage at [email or web address], password [facility] 08/17/2014, 5:28 PM

## 2014-08-17 NOTE — Progress Notes (Signed)
CARE MANAGEMENT NOTE 08/17/2014  Patient:  Brooke [name],Brooke [name]   Account Number:  [id number]  Date Initiated:  08/17/2014  Documentation initiated by:  Brooke [name],Brooke [name]  Subjective/Objective Assessment:   74 yo female admitted with possible cellulitis of buttock wound     Action/Plan:   discharge plannning   Anticipated DC Date:  08/18/2014   Anticipated DC Plan:        DC Planning Services  CM consult      Choice offered to / List presented to:             Status of service:  In process, will continue to follow Medicare Important Message given?   (If response is "NO", the following Medicare IM given date fields will be blank) Date Medicare IM given:   Medicare IM given by:   Date Additional Medicare IM given:   Additional Medicare IM given by:    Discharge Disposition:    Per UR Regulation:    If discussed at Long Length of Stay Meetings, dates discussed:    Comments:  08/17/14 Brooke CavaAndrea Schettino RN BSN CM [phone] Spoke with patient regarding MD consult and PT recommendation for [name]. Patient stated that [facility] and she would [facility]. She stated that she was discharged from hospice services earlier this month because she gained weight but now that she is in the [name] she is hopeful she is eligible for hospice services. This CM spoke with the attending MD regarding patient wish and then [name] patient request fro hosp[facility] services. Brooke [name] stated that [name] will review the patient's information and call back with results of eligibility. The patient stated that if she cannot have hospice, she does not want HH because she is not able to afford the copay and they "don't come very often and are not long term". Patient stated that her daughter Brooke [name] lives nearby and is a big support and her friend Brooke [name]. Patient stated she has home O2 that she wears prn but has concentrator and  portable tanks, a rolling walker, shower chair, and a nebulizer machine. The patient would like a rollator (walker with seat) and will pay out of pocket if necessary. Will follow up regarding patient's eligibility for home  hospice care.

## 2014-08-18 DIAGNOSIS — J471 Bronchiectasis with (acute) exacerbation: Secondary | ICD-10-CM | POA: Diagnosis not present

## 2014-08-18 DIAGNOSIS — E860 Dehydration: Secondary | ICD-10-CM

## 2014-08-18 DIAGNOSIS — R29898 Other symptoms and signs involving the musculoskeletal system: Secondary | ICD-10-CM | POA: Diagnosis not present

## 2014-08-18 DIAGNOSIS — E43 Unspecified severe protein-calorie malnutrition: Secondary | ICD-10-CM | POA: Diagnosis not present

## 2014-08-18 LAB — BASIC METABOLIC PANEL
Anion gap: 8 (ref 5–15)
BUN: 16 mg/dL (ref 6–23)
CO2: 27 mmol/L (ref 19–32)
Calcium: 8.8 mg/dL (ref 8.4–10.5)
Chloride: 105 mmol/L (ref 96–112)
Creatinine, Ser: 0.76 mg/dL (ref 0.50–1.10)
GFR, EST NON AFRICAN AMERICAN: 80 mL/min — AB (ref >90)
Glucose, Bld: 85 mg/dL (ref 70–99)
POTASSIUM: 4.3 mmol/L (ref 3.5–5.1)
SODIUM: 140 mmol/L (ref 135–145)

## 2014-08-18 LAB — CBC
HCT: 30 % — ABNORMAL LOW (ref 36.0–46.0)
Hemoglobin: 9.6 g/dL — ABNORMAL LOW (ref 12.0–15.0)
MCH: 29.4 pg (ref 26.0–34.0)
MCHC: 32 g/dL (ref 30.0–36.0)
MCV: 91.7 fL (ref 78.0–100.0)
Platelets: 406 10*3/uL — ABNORMAL HIGH (ref 150–400)
RBC: 3.27 MIL/uL — AB (ref 3.87–5.11)
RDW: 13.7 % (ref 11.5–15.5)
WBC: 8.3 10*3/uL (ref 4.0–10.5)

## 2014-08-18 LAB — LIPID PANEL
CHOL/HDL RATIO: 2.2 ratio
CHOLESTEROL: 142 mg/dL (ref 0–200)
HDL: 66 mg/dL (ref >39)
LDL Cholesterol: 64 mg/dL (ref 0–99)
Triglycerides: 61 mg/dL (ref <150)
VLDL: 12 mg/dL (ref 0–40)

## 2014-08-18 MED ORDER — PREDNISONE 50 MG PO TABS: 50.0000 mg | ORAL_TABLET | Freq: Every day | ORAL | Status: DC

## 2014-08-18 MED ORDER — CIPROFLOXACIN HCL 500 MG PO TABS: 500.0000 mg | ORAL_TABLET | Freq: Two times a day (BID) | ORAL | Status: DC

## 2014-08-18 MED ORDER — ENSURE COMPLETE PO LIQD: 237.0000 mL | Freq: Two times a day (BID) | ORAL | Status: DC

## 2014-08-18 NOTE — Progress Notes (Signed)
CARE MANAGEMENT NOTE 08/18/2014  Patient:  Brooke [name],Brooke [name]   Account Number:  [id number]  Date Initiated:  08/17/2014  Documentation initiated by:  Ferdinand CavaSCHETTINO,Brooke  Subjective/Objective Assessment:   74 yo female admitted with possible cellulitis of buttock wound     Action/Plan:   discharge plannning   Anticipated DC Date:  08/18/2014   Anticipated DC Plan:  HOME W HOME HEALTH SERVICES      DC Planning Services  CM consult      [facility]   Choice offered to / List presented to:  [name] Patient        HH arranged  HH-1 RN  HH-2 PT  HH-4 NURSE'S AIDE      HH agency  [facility]   Status of service:  Completed, signed off Medicare Important Message given?  NA - LOS <3 / Initial given by admissions (If response is "NO", the following Medicare IM given date fields will be blank) Date Medicare IM given:   Medicare IM given by:   Date Additional Medicare IM given:   Additional Medicare IM given by:    Discharge Disposition:  HOME W HOME HEALTH SERVICES  Per UR Regulation:    If discussed at Long Length of Stay Meetings, dates discussed:    Comments:   08/18/2014 1100 [facility] dc [facility] with [facility]. NCM spoke to pt and she has RW and cane at home. States she does not want a 3n1 for home. Isidoro DonningAlesia Seanna Sisler RN CCM Case Mgmt phone [phone]  08/17/14 Ferdinand CavaAndrea Schettino RN BSN CM [phone] Received return [facility] and she stated that the hospice MD stated she is not eligible for home services. Explained this to the patient and she still has many questions regarding hospice eligibility. Encouraged her to make appointment with PCP, Marletta LorJulie Barr NP, with [facility] and discuss hospice with her and have questions answered. Patient requested rollator but explained that she has had her current walker less than 5 years and if she wants a rollator at this time she will have to purchase out of  pocket. Also discussed recommended HH services and patient is agreeable to PT, RN, and aide. Offered to call her daughter Brooke [name] to discuss provided information but she declined. Await discharge orders.  08/17/14 Ferdinand CavaAndrea Schettino RN BSN CM [phone] Spoke with patient regarding MD consult and [facility]. Patient stated that [facility] and she would like to return [facility]. She stated that she was discharged from hospice services earlier this month because she gained weight but now that she is in the hospital she is hopeful she is eligible for hospice services. This CM spoke with the attending MD regarding patient wish and then contacted [facility] regarding [facility] request fro [facility] services. Misty StanleyLisa stated that [name] will review the patient's information and call back with results of eligibility. The patient stated that if she cannot have hospice, she does not want HH because she is not able to afford the copay and they "don't come very often and are not long term". Patient stated that her daughter Brooke [name] lives nearby and is a big support and her friend Brooke [name]. Patient stated she has home O2 that she wears prn but has concentrator and portable tanks, a rolling walker, shower chair, and a nebulizer machine. The patient  would like a rollator (walker with seat) and will pay out of pocket if necessary. Will follow up regarding patient's eligibility for home  hospice care.

## 2014-08-18 NOTE — Progress Notes (Signed)
Pt left at this time with her daughter. Alert, oriented, and without c/o. Discharge instructions/prescriptions given/explained with pt verbalizing understanding. Followup appointments noted.

## 2014-08-18 NOTE — Discharge Summary (Addendum)
Physician Discharge Summary  Brooke [name] ZOX:[id number] DOB: April 20, 1941 DOA: 08/16/2014  PCP: Louanna Raw, MD  Admit date: 08/16/2014 Discharge date: 08/18/2014  Recommendations for Outpatient Follow-up:  1. Home health PT/OT/RN/aid 2. Recommended SNF and 24 hour supervision, however, patient declined.  We discussed the risk of fall, hip fracture, high mortality rate after fall with fracture and the importance of having 24 hour access to assistance.  She states she will keep her phone with her and put the phone number for her daughter and son-in-law on her phone so she can get help right away.  3. Encouraged her to continue to try to gain weight and drink at least 8 glasses of water per day.  May use supplements also. 4. Wound care:  Silicone foam dressing to the left buttock wound, ok to use 3x3 foam instead of sacral foam over the wound. Change every 3 days and PRN soilage.  Discharge Diagnoses:  Principal Problem:   Dehydration Active Problems:   BRONCHIECTASIS   Severe protein-calorie malnutrition   Chronic respiratory failure   Weakness of both legs   Leukocytosis   Sacral decubitus ulcer   Muscular deconditioning   Discharge Condition: stable, improved  Diet recommendation: regular  Wt Readings from Last 3 Encounters:  08/16/14 48.1 kg (106 lb 0.7 oz)  08/17/14 48.081 kg (106 lb)  07/18/14 46.267 kg (102 lb)    History of present illness:  The patient is a 74 y.o. year-old female with history of mycobacterium avium complex, bronchiectasis followed by [name] on O2, hypothyroidism, protein calorie malnutrition who was discharged from hospice care 1 week ago because she gained two pounds, who presented with lower extremity weakness. She lives at home alone with support from her daughter who lives nearby. The patient had progressive shortness of breath with cough productive of thick olive green sputum. On the day of admission, she was sitting on the edge of the couch  trying to stand up when she felt her legs give out from underneath her and she slid off the edge of the couch to the floor. She did not lose consciousness or hit her head. At the time of admission, she denied focal numbness, slurred speech, confusion and stated that both of her legs were equally weak. The following day, she reported that although initially both legs were weak, her right leg recovered quickly, but her left leg continues to have weakness with muscle pain. She was treated for possible superinfection of bronchiectasis with steroids and ciprofloxacin. She was hydrated.  Her strength has recovered some and she was able to ambulate with PT.  MRI brain negative for acute stroke. Due to finances, she declined SNF.   Hospital Course:   Generalized weakness may have been secondary to superinfection of bronchiectasis causing hypoxemia, dehydration, and deconditioning. She also does not wear her oxygen as prescribed.  She was dehydrated. Stroke was considered unlikely initially based on history and exam, however, patient has amended her story to say she had unilateral weakness and was leaning to the left, so MRI was done which demonstrated no acute infarct.  She has chronic microvascular ischemia and deconditioning which may be affecting her balance.   - magnesium and phosphorus wnl - Check CK and AST/ALT wnl - TSH 1.39 - Treated bronchiectasis with antibiotics, steroids, and breathing treatments - PT/OT: Recommending SNF, patient high falls risk, but declined because she cannot afford SNF - Case reviewed again and she is not a candidate for hospice - MRI brain:  Neg  for stroke - ASA daily  Bronchiectasis and MAC - Followed by [name] - Sputum culture:  Unable to be obtained - Continue prednisone  daily - Ciprofloxacin day 3, to complete a 7-day course - Continue duonebs - Keep O2 sat > 88%, encouraged to wear her oxygen 3L before and during exertion  Cachexia  and severe protein-calorie malnutrition - Nutrition consult - Regular diet with supplements  Lower extremity edema, mild - Suspect low albumin stores - check albumin level - Duplex lower extremity negative - Defer ECHO as will not change immediate management of patient's care  Leukocytosis, may be reactive from developing infection  Anemia of chronic disease, hemoglobin at baseline - Occult stool negative   Sacral decubitus ulcer, stage II - Wound care consulted - Recommended foam dressing   Consultants: none Procedures:  CT head  Duplex lower extremity  MRI brain  Antibiotics:  cipro 3/10 >>  Discharge Exam: Filed Vitals:   08/18/14 0619  BP: 125/71  Pulse: 67  Temp: 97.9 F (36.6 C)  Resp: 18   Filed Vitals:   08/17/14 1508 08/17/14 2222 08/18/14 0559 08/18/14 0619  BP: 106/39 99/54 104/43 125/71  Pulse: 63 60 56 67  Temp: 98.2 F (36.8 C) 98.1 F (36.7 C) 97.8 F (36.6 C) 97.9 F (36.6 C)  TempSrc: Oral Oral Oral Oral  Resp: Height:      Weight:      SpO2: 92% 97% 97% 99%     General: Cachectic female, No acute distress  HEENT: NCAT, MMM  Cardiovascular: RRR, nl S1, S2 no mrg, 2+ pulses, warm extremities  Respiratory: Course rales bilaterally throughout, no increased WOB  Abdomen: NABS, soft, NT/ND  MSK: Decreased tone and bulk, no LEE  Neuro: Diminished patellar reflex on right, 2+ on left. Stiff in left leg, unable to relax leg very well, almost feels contracted in ankle. Pain in left quad and calf. 5/5 strength hips, 4/5 knees bilaterally, 4/5 left ankle and 5/5 right ankle dorsi/plantar flexion  Discharge Instructions      Discharge Instructions    Call MD for:  difficulty breathing, headache or visual disturbances    Complete by:  As directed      Call MD for:  extreme fatigue    Complete by:  As directed      Call MD for:  hives    Complete by:  As directed      Call MD for:  persistant  dizziness or light-headedness    Complete by:  As directed      Call MD for:  persistant nausea and vomiting    Complete by:  As directed      Call MD for:  severe uncontrolled pain    Complete by:  As directed      Call MD for:  temperature >100.4    Complete by:  As directed      Diet general    Complete by:  As directed      Discharge instructions    Complete by:  As directed   You were hospitalized with weakness in your legs which may have been due to dehydration and a respiratory infection.  Please drink at least 8 glasses of water or juice per day.  For your breathing, please take a few more days of prednisone and ciprofloxacin and make sure to use your nebulizer every 4-6 hours as needed.  Wear your oxygen before and during times when you are  exerting yourself.  Please call the pulmonology office on Monday to schedule a follow up appointment.     Discharge wound care:    Complete by:  As directed   Silicone foam dressing to the left buttock wound, ok to use 3x3 foam instead of sacral foam over the wound. Change every 3 days and PRN soilage.     Increase activity slowly    Complete by:  As directed             Medication List    STOP taking these medications        furosemide 20 MG tablet  Commonly known as:  LASIX      TAKE these medications        albuterol (2.5 MG/3ML) 0.083% nebulizer solution  Commonly known as:  PROVENTIL  Take 3 mLs (2.5 mg total) by nebulization every 6 (six) hours as needed (respiratory distress).     albuterol 108 (90 BASE) MCG/ACT inhaler  Commonly known as:  VENTOLIN HFA  Inhale 2 puffs into the lungs every 6 (six) hours as needed.     ALPRAZolam 0.5 MG tablet  Commonly known as:  XANAX  Take 1 tablet (0.5 mg total) by mouth 4 (four) times daily as needed for anxiety.     BC HEADACHE POWDER PO  Take 1 tablet by mouth daily as needed (headache).     ciprofloxacin 500 MG tablet  Commonly known as:  CIPRO  Take 1 tablet (500 mg total)  by mouth 2 (two) times daily.     citalopram 40 MG tablet  Commonly known as:  CELEXA  TAKE ONE (1) TABLET BY MOUTH EVERY DAY     feeding supplement (ENSURE COMPLETE) Liqd  Take 237 mLs by mouth 2 (two) times daily between meals.     fluticasone 50 MCG/ACT nasal spray  Commonly known as:  FLONASE  2 sprays each nostril daily-hospice     guaiFENesin-codeine 100-10 MG/5ML syrup  Commonly known as:  CHERATUSSIN AC  Take 5-10 mLs by mouth every 6 (six) hours as needed for cough.     levothyroxine 100 MCG tablet  Commonly known as:  SYNTHROID, LEVOTHROID  Take 0.5 tablets (50 mcg total) by mouth daily before breakfast.     loperamide 2 MG tablet  Commonly known as:  IMODIUM A-D  Take 2 mg by mouth daily as needed for diarrhea or loose stools (diarrhea).     loratadine 10 MG tablet  Commonly known as:  CLARITIN  Take 10 mg by mouth daily as needed for allergies (allergies).     naproxen 250 MG tablet  Commonly known as:  NAPROSYN  Take 1 tablet by mouth 2 (two) times daily.     nystatin-triamcinolone cream  Commonly known as:  MYCOLOG II  Apply 1 application topically 2 (two) times daily as needed. To the affected area     Oxycodone HCl 10 MG Tabs  Take 1-2 tablets (10-20 mg total) by mouth every 4 (four) hours as needed (pain).     predniSONE 50 MG tablet  Commonly known as:  DELTASONE  Take 1 tablet (50 mg total) by mouth daily with breakfast.     prochlorperazine 10 MG tablet  Commonly known as:  COMPAZINE  TAKE ONE (1) TABLET BY MOUTH EVERY 6 HOURS AS NEEDED FOR NAUSEA     QC SENNA-S 8.6-50 MG per tablet  Generic drug:  senna-docusate  TAKE 1 TO 5 TABLETS BY MOUTH ONE TO TWO TIMES DAILY  AS NEEDED FOR CONSTIPATION     traZODone 100 MG tablet  Commonly known as:  DESYREL  Take 2 tablets (200 mg total) by mouth at bedtime.     triamcinolone 0.025 % cream  Commonly known as:  KENALOG  APPLY TO AFFECTED AREA THREE TIMES DAILYAS NEEDED       Follow-up Information     Follow up with [name],[name], MD. Schedule an appointment as soon as possible for a visit in 1 month.   Specialty:  General Practice   Contact information:   [address] [place] [address] [phone]       Follow up with Shan LevansPatrick Wright, MD. Schedule an appointment as soon as possible for a visit in 2 weeks.   Specialty:  Pulmonary Disease   Contact information:   [address] [place] [address] [phone]        The results of significant diagnostics from this hospitalization (including imaging, microbiology, ancillary and laboratory) are listed below for reference.    Significant Diagnostic Studies: Dg Chest 2 View  08/16/2014   CLINICAL DATA:  Weakness. Cough. History of "Pulmonary disease due to mycobacteria"  EXAM: CHEST  2 VIEW  COMPARISON:  07/05/2013  FINDINGS: Patient rotated right. Midline trachea. Normal heart size and mediastinal contours for age. Biapical pleural thickening. No pleural effusion or pneumothorax. Lower lobe predominant, right greater than left marked pulmonary interstitial thickening. Similar to slightly improved since the prior exam. No well-defined superimposed lobar consolidation. Volume loss in the right middle lobe and/or lingula, most apparent on the lateral view. Progressive.  Left lower lobe calcified granuloma.  IMPRESSION: Similar to slight improvement in lower lobe predominant marked interstitial thickening. Likely due to peribronchovascular interstitial opacities and possibly component of bronchiectasis. Presumably secondary to the clinical history of mycobacterial infection, chronic. No convincing evidence of acute superimposed pneumonia.   Electronically Signed   By: Jeronimo GreavesKyle  Talbot M.D.   On: 08/16/2014 14:17   Ct Head Wo Contrast  08/16/2014   CLINICAL DATA:  Weakness and inability to walk due to weakness.  EXAM: CT HEAD WITHOUT CONTRAST  TECHNIQUE: Contiguous axial images were obtained from the base of the skull through the vertex  without intravenous contrast.  COMPARISON:  05/20/2006  FINDINGS: There is patchy low attenuation within the subcortical and periventricular white matter compatible with chronic microvascular disease. Prominence of the sulci and ventricles identified consistent with brain atrophy. No acute cortical infarct, hemorrhage, or mass lesion ispresent. No significant extra-axial fluid collection is present. The paranasal sinuses andmastoid air cells are clear. The osseous skull is intact.  IMPRESSION: 1. No acute intracranial abnormality. 2. Chronic microvascular disease and brain atrophy.   Electronically Signed   By: Signa Kellaylor  Stroud M.D.   On: 08/16/2014 16:33   Mr Brain Wo Contrast  08/17/2014   CLINICAL DATA:  Left lower extremity weakness.  EXAM: MRI HEAD WITHOUT CONTRAST  TECHNIQUE: Multiplanar, multiecho pulse sequences of the brain and surrounding structures were obtained without intravenous contrast.  COMPARISON:  CT head without contrast 08/16/2014  FINDINGS: Mild atrophy and white matter changes are somewhat advanced for age. No acute infarct, hemorrhage, or mass lesion is evident. The ventricles are proportionate to the degree of atrophy.  Flow is present in the major intracranial arteries. The patient is status post bilateral lens replacements. The globes and orbits are otherwise within normal limits.  There is some fluid in the right mastoid air cells. No obstructing nasopharyngeal lesion is evident. The paranasal sinuses are  clear.  The skullbase is within normal limits. The midline structures demonstrate degenerative changes in the upper cervical spine. No focal lesions are evident.  IMPRESSION: 1. Mild generalized atrophy and white matter disease is somewhat advanced for age. This likely reflects the sequela of chronic microvascular ischemia. 2. No acute intracranial abnormality or focal lesion to explain left lower extremity weakness. 3. Right mastoid effusion. No obstructing nasopharyngeal lesion is  evident.   Electronically Signed   By: Marin Roberts M.D.   On: 08/17/2014 19:03    Microbiology: Recent Results (from the past 240 hour(s))  Urine culture     Status: None   Collection Time: 08/16/14  5:00 PM  Result Value Ref Range Status   Specimen Description URINE, CATHETERIZED  Final   Special Requests NONE  Final   Colony Count NO GROWTH Performed at [facility]   Final   Culture NO GROWTH Performed at [facility]   Final   Report Status 08/17/2014 FINAL  Final  MRSA PCR Screening     Status: None   Collection Time: 08/17/14  2:36 PM  Result Value Ref Range Status   MRSA by PCR NEGATIVE NEGATIVE Final    Comment:        The GeneXpert MRSA Assay (FDA approved for NASAL specimens only), is one component of a comprehensive MRSA colonization surveillance program. It is not intended to diagnose MRSA infection nor to guide or monitor treatment for MRSA infections. Performed at [facility]      Labs: Basic Metabolic Panel:  Recent Labs Lab 08/16/14 1420 08/16/14 2231 08/17/14 0540 08/18/14 0543  NA 141  --  141 140  K 3.9  --  3.7 4.3  CL 103  --  106 105  CO2 32  --  27 27  GLUCOSE 90  --  83 85  BUN 22  --  14 16  CREATININE 0.87  --  0.79 0.76  CALCIUM 8.7  --  8.4 8.8  MG  --  1.6  --   --   PHOS  --  3.1  --   --    Liver Function Tests:  Recent Labs Lab 08/16/14 2231  AST 27  ALT 13  ALKPHOS 102  BILITOT 0.1*  PROT 6.9  ALBUMIN 3.0*   No results for input(s): LIPASE, AMYLASE in the last 168 hours. No results for input(s): AMMONIA in the last 168 hours. CBC:  Recent Labs Lab 08/16/14 1420 08/17/14 0540 08/18/14 0543  WBC 10.6* 8.5 8.3  NEUTROABS 7.4  --   --   HGB 10.8* 9.8* 9.6*  HCT 33.3* 30.7* 30.0*  MCV 92.2 91.4 91.7  PLT 396 382 406*   Cardiac Enzymes:  Recent Labs Lab 08/16/14 2231  CKTOTAL 100   BNP: BNP (last 3 results) No results for input(s): BNP in the last 8760  hours.  ProBNP (last 3 results) No results for input(s): PROBNP in the last 8760 hours.  CBG: No results for input(s): GLUCAP in the last 168 hours.  Time coordinating discharge: 35 minutes  Signed:  Eniya Cannady  [facility] 08/18/2014, 11:08 AM

## 2014-08-18 NOTE — Progress Notes (Signed)
Utilization Review completed.  

## 2014-08-20 LAB — HEMOGLOBIN A1C
Hgb A1c MFr Bld: 5.1 % (ref 4.8–5.6)
Mean Plasma Glucose: 100 mg/dL

## 2014-08-23 ENCOUNTER — Telehealth: Payer: Self-pay | Admitting: Critical Care Medicine

## 2014-08-23 MED ORDER — GUAIFENESIN-CODEINE 100-10 MG/5ML PO SYRP: 5.0000 mL | ORAL_SOLUTION | Freq: Four times a day (QID) | ORAL | Status: DC | PRN

## 2014-08-23 NOTE — Telephone Encounter (Signed)
Ok to refill cough syrup 

## 2014-08-23 NOTE — Telephone Encounter (Signed)
Spoke with the pt  She is c/o increased cough- prod with clear sputum  She is requesting refill on cheratussin cough syrup  Last given on 07/18/14 # 180 ml  Please advise thanks! Allergies  Allergen Reactions  . Ambien [Zolpidem Tartrate]     Sleep walking and altered mental status  . Doxycycline     vomiting  . Levofloxacin Other (See Comments)    Unknown rxn.  Tolerates Cipro  . Mometasone Furoate Other (See Comments)     nasonex causes swollen throat, thrush  . Penicillins Other (See Comments)    Unknown reaction noted on hospice paperwork  . Sulfonamide Derivatives Other (See Comments)    Unknown reaction noted on hospice paperwork   Current Outpatient Prescriptions on File Prior to Visit  Medication Sig Dispense Refill  . albuterol (PROVENTIL) (2.5 MG/3ML) 0.083% nebulizer solution Take 3 mLs (2.5 mg total) by nebulization every 6 (six) hours as needed (respiratory distress). 360 mL 3  . albuterol (VENTOLIN HFA) 108 (90 BASE) MCG/ACT inhaler Inhale 2 puffs into the lungs every 6 (six) hours as needed. 1 Inhaler 3  . ALPRAZolam (XANAX) 0.5 MG tablet Take 1 tablet (0.5 mg total) by mouth 4 (four) times daily as needed for anxiety. 120 tablet 1  . Aspirin-Salicylamide-Caffeine (BC HEADACHE POWDER PO) Take 1 tablet by mouth daily as needed (headache).     . ciprofloxacin (CIPRO) 500 MG tablet Take 1 tablet (500 mg total) by mouth 2 (two) times daily. 10 tablet 0  . citalopram (CELEXA) 40 MG tablet TAKE ONE (1) TABLET BY MOUTH EVERY DAY 30 tablet 2  . feeding supplement, ENSURE COMPLETE, (ENSURE COMPLETE) LIQD Take 237 mLs by mouth 2 (two) times daily between meals. 100 Bottle 0  . fluticasone (FLONASE) 50 MCG/ACT nasal spray 2 sprays each nostril daily-hospice 16 g 1  . guaiFENesin-codeine (CHERATUSSIN AC) 100-10 MG/5ML syrup Take 5-10 mLs by mouth every 6 (six) hours as needed for cough. 180 mL 0  . levothyroxine (SYNTHROID, LEVOTHROID) 100 MCG tablet Take 0.5 tablets (50 mcg total)  by mouth daily before breakfast. 15 tablet 6  . loperamide (IMODIUM A-D) 2 MG tablet Take 2 mg by mouth daily as needed for diarrhea or loose stools (diarrhea).     . loratadine (CLARITIN) 10 MG tablet Take 10 mg by mouth daily as needed for allergies (allergies).     . naproxen (NAPROSYN) 250 MG tablet Take 1 tablet by mouth 2 (two) times daily.    Marland Kitchen. nystatin-triamcinolone (MYCOLOG II) cream Apply 1 application topically 2 (two) times daily as needed. To the affected area 30 g 6  . Oxycodone HCl 10 MG TABS Take 1-2 tablets (10-20 mg total) by mouth every 4 (four) hours as needed (pain). 180 tablet 0  . predniSONE (DELTASONE) 50 MG tablet Take 1 tablet (50 mg total) by mouth daily with breakfast. 5 tablet 0  . prochlorperazine (COMPAZINE) 10 MG tablet TAKE ONE (1) TABLET BY MOUTH EVERY 6 HOURS AS NEEDED FOR NAUSEA 30 tablet 3  . QC SENNA-S 8.6-50 MG per tablet TAKE 1 TO 5 TABLETS BY MOUTH ONE TO TWO TIMES DAILY AS NEEDED FOR CONSTIPATION 60 tablet 6  . traZODone (DESYREL) 100 MG tablet Take 2 tablets (200 mg total) by mouth at bedtime. 60 tablet 3  . triamcinolone (KENALOG) 0.025 % cream APPLY TO AFFECTED AREA THREE TIMES DAILYAS NEEDED 30 g 0   No current facility-administered medications on file prior to visit.

## 2014-08-23 NOTE — Telephone Encounter (Signed)
Rx was called to pharm  Pt aware  Nothing further needed 

## 2014-08-28 ENCOUNTER — Telehealth: Payer: Self-pay | Admitting: Critical Care Medicine

## 2014-08-28 DIAGNOSIS — R05 Cough: Secondary | ICD-10-CM

## 2014-08-28 DIAGNOSIS — R058 Other specified cough: Secondary | ICD-10-CM

## 2014-08-28 MED ORDER — DOXYCYCLINE HYCLATE 100 MG PO TABS: 100.0000 mg | ORAL_TABLET | Freq: Two times a day (BID) | ORAL | Status: DC

## 2014-08-28 NOTE — Telephone Encounter (Signed)
She was treated with cipro in the hospital obtain sputum cx Doxy 100 daily x 7d ays

## 2014-08-28 NOTE — Telephone Encounter (Signed)
Per RA- Ok for pt to take as long as with food. Pt to call if issues arise.   Informed Chip BoerVicki. Rx sent to preferred pharmacy. Chip BoerVicki verbalized understanding and denied any further questions or concerns at this time.

## 2014-08-28 NOTE — Telephone Encounter (Signed)
Called and spoke to the nurse. [facility] is requesting PT, OT and nursing visits after pt d/c from hospital stay for COPD.  PT visits: 3 times per week for 2 weeks, then 2 times per week for 3 weeks, then 1 time a week for 2 weeks. Working on increasing functional mobility.  OT visits: 2 times per week for 4 weeks. Working on safety, upper body strength, ADL's, and transfers.  Nursing visits: 1 time per week for 9 weeks. Working on Animal nutritionistcardiopulmonary assessment, med management, and wound care--pt has stage II pressure ulcer on left buttock.   Pt does not have PCP.   PW please advise if ok to order. Thanks.

## 2014-08-28 NOTE — Telephone Encounter (Signed)
Called and spoke to pt's daughter, Chip BoerVicki. Pt c/o prod cough with yellow and green mucus, increase in SOB, chest tightness, and weakness. Pt was recently d/c from hospital and s/s have presented shortly after hospital d/c. Pt denies f/c/s. Pt has HFU with PW on 4/5. Pt requesting recs before appt. PW unavailable today-will send to doc of day.   RA please advise.   Allergies  Allergen Reactions  . Ambien [Zolpidem Tartrate]     Sleep walking and altered mental status  . Doxycycline     vomiting  . Levofloxacin Other (See Comments)    Unknown rxn.  Tolerates Cipro  . Mometasone Furoate Other (See Comments)     nasonex causes swollen throat, thrush  . Penicillins Other (See Comments)    Unknown reaction noted on hospice paperwork  . Sulfonamide Derivatives Other (See Comments)    Unknown reaction noted on hospice paperwork

## 2014-08-28 NOTE — Telephone Encounter (Signed)
Called and spoke to pt's daughter, Chip BoerVicki. Informed her of the sputum culture. Sputum cup up front for pick up. Order placed.   RA please advise---Pt has allergy to Doxy, causes vomiting-pt unsure if it was related to taking med on empty stomach.

## 2014-08-29 NOTE — Telephone Encounter (Signed)
i am ok with this order.  [name] was to provide pt with PCP?

## 2014-08-29 NOTE — Telephone Encounter (Signed)
lmtcb x1 for [employer]

## 2014-08-30 NOTE — Telephone Encounter (Signed)
lmtcb for [employer].

## 2014-09-03 NOTE — Telephone Encounter (Signed)
lmtcb x3 for [employer]

## 2014-09-04 NOTE — Telephone Encounter (Signed)
LMTC x 1 for Brooke [name]

## 2014-09-04 NOTE — Telephone Encounter (Signed)
[facility] and advised that Dr Delford FieldWright is ok with order requested.  She verbalized understanding and stated that it looks like pt was recently assigned a PCP.

## 2014-09-11 ENCOUNTER — Encounter: Payer: Self-pay | Admitting: Critical Care Medicine

## 2014-09-11 ENCOUNTER — Ambulatory Visit (INDEPENDENT_AMBULATORY_CARE_PROVIDER_SITE_OTHER): Payer: Commercial Managed Care - HMO | Admitting: Critical Care Medicine

## 2014-09-11 VITALS — BP 98/62 | HR 77 | Temp 98.3°F | Ht 63.0 in | Wt 103.0 lb

## 2014-09-11 DIAGNOSIS — R29898 Other symptoms and signs involving the musculoskeletal system: Secondary | ICD-10-CM | POA: Diagnosis not present

## 2014-09-11 DIAGNOSIS — L89152 Pressure ulcer of sacral region, stage 2: Secondary | ICD-10-CM | POA: Diagnosis not present

## 2014-09-11 DIAGNOSIS — J479 Bronchiectasis, uncomplicated: Secondary | ICD-10-CM

## 2014-09-11 DIAGNOSIS — E43 Unspecified severe protein-calorie malnutrition: Secondary | ICD-10-CM

## 2014-09-11 DIAGNOSIS — Z515 Encounter for palliative care: Secondary | ICD-10-CM | POA: Diagnosis not present

## 2014-09-11 MED ORDER — ALBUTEROL SULFATE HFA 108 (90 BASE) MCG/ACT IN AERS: 2.0000 | INHALATION_SPRAY | Freq: Four times a day (QID) | RESPIRATORY_TRACT | Status: AC | PRN

## 2014-09-11 MED ORDER — GUAIFENESIN-CODEINE 100-10 MG/5ML PO SYRP: 5.0000 mL | ORAL_SOLUTION | Freq: Four times a day (QID) | ORAL | Status: DC | PRN

## 2014-09-11 NOTE — Progress Notes (Signed)
Subjective:    Patient ID: Brooke [name], female    DOB: 1940/12/24, 74 y.o.   MRN: [id number]  HPI 74 y.o..WF with Bronchiectasis. MRSA colonization   09/11/2014 Chief Complaint  Patient presents with  . Hospitalization Follow-up    Discharged from hospital on 08/18/14. Breathing is worse since leaving hospital. Reports SOB, chest tightness, dry cough and wheezing.  Pt d/c from hospice 07/2014.  Pt adm 3/10- 08/18/14 for dehydration/weakness/diarrhea.  Could not afford home care MD.  Cannot afford even copays.   Since home from hosp, cough is constant and dry cough.  Notes some wheezing. Low grade fever daily.  Still broke down on bottom.  Using oxygen night and prn daytime Pt had HH RN/PT /OT for one week.  Then too tired to do PT  Pcp is cammie fulp Wt Readings from Last 3 Encounters:  09/11/14 103 lb (46.72 kg)  08/16/14 106 lb 0.7 oz (48.1 kg)  08/17/14 106 lb (48.081 kg)     Past Medical History  Diagnosis Date  . Hypothyroidism   . History of allergy   . Mycobacterium avium complex   . Bronchiectasis     colonized mrsa, s/p vancomyin IV 10 days 5/10 and 7 days 6/10  . Pneumonia   . Headache(784.0)      Family History  Problem Relation Age of Onset  . Lung disease Father   . Lung disease Paternal Uncle   . Lung disease Paternal Uncle   . Lung disease Paternal Aunt   . Lung cancer Father      History   Social History  . Marital Status: Widowed    Spouse Name: N/A  . Number of Children: N/A  . Years of Education: N/A   Occupational History  . Not on file.   Social History Main Topics  . Smoking status: Former Smoker -- 1.00 packs/day for 3 years    Types: Cigarettes    Quit date: 06/08/1962  . Smokeless tobacco: Never Used  . Alcohol Use: No  . Drug Use: No  . Sexual Activity: Not on file   Other Topics Concern  . Not on file   Social History Narrative   Lives at home alone and ambulates with rolling walker     Allergies  Allergen Reactions  .  Ambien [Zolpidem Tartrate]     Sleep walking and altered mental status  . Doxycycline     vomiting  . Levofloxacin Other (See Comments)    Unknown rxn.  Tolerates Cipro  . Mometasone Furoate Other (See Comments)     nasonex causes swollen throat, thrush  . Penicillins Other (See Comments)    Unknown reaction noted on hospice paperwork  . Sulfonamide Derivatives Other (See Comments)    Unknown reaction noted on hospice paperwork     Outpatient Prescriptions Prior to Visit  Medication Sig Dispense Refill  . albuterol (PROVENTIL) (2.5 MG/3ML) 0.083% nebulizer solution Take 3 mLs (2.5 mg total) by nebulization every 6 (six) hours as needed (respiratory distress). 360 mL 3  . Aspirin-Salicylamide-Caffeine (BC HEADACHE POWDER PO) Take 1 tablet by mouth daily as needed (headache).     . citalopram (CELEXA) 40 MG tablet TAKE ONE (1) TABLET BY MOUTH EVERY DAY 30 tablet 2  . feeding supplement, ENSURE COMPLETE, (ENSURE COMPLETE) LIQD Take 237 mLs by mouth 2 (two) times daily between meals. 100 Bottle 0  . fluticasone (FLONASE) 50 MCG/ACT nasal spray 2 sprays each nostril daily-hospice 16 g 1  . levothyroxine (SYNTHROID,  LEVOTHROID) 100 MCG tablet Take 0.5 tablets (50 mcg total) by mouth daily before breakfast. 15 tablet 6  . loperamide (IMODIUM A-D) 2 MG tablet Take 2 mg by mouth daily as needed for diarrhea or loose stools (diarrhea).     . loratadine (CLARITIN) 10 MG tablet Take 10 mg by mouth daily as needed for allergies (allergies).     . naproxen (NAPROSYN) 250 MG tablet Take 1 tablet by mouth 2 (two) times daily.    Marland Kitchen. nystatin-triamcinolone (MYCOLOG II) cream Apply 1 application topically 2 (two) times daily as needed. To the affected area 30 g 6  . Oxycodone HCl 10 MG TABS Take 1-2 tablets (10-20 mg total) by mouth every 4 (four) hours as needed (pain). 180 tablet 0  . prochlorperazine (COMPAZINE) 10 MG tablet TAKE ONE (1) TABLET BY MOUTH EVERY 6 HOURS AS NEEDED FOR NAUSEA 30 tablet 3  . QC  SENNA-S 8.6-50 MG per tablet TAKE 1 TO 5 TABLETS BY MOUTH ONE TO TWO TIMES DAILY AS NEEDED FOR CONSTIPATION 60 tablet 6  . traZODone (DESYREL) 100 MG tablet Take 2 tablets (200 mg total) by mouth at bedtime. 60 tablet 3  . triamcinolone (KENALOG) 0.025 % cream APPLY TO AFFECTED AREA THREE TIMES DAILYAS NEEDED 30 g 0  . albuterol (VENTOLIN HFA) 108 (90 BASE) MCG/ACT inhaler Inhale 2 puffs into the lungs every 6 (six) hours as needed. 1 Inhaler 3  . guaiFENesin-codeine (CHERATUSSIN AC) 100-10 MG/5ML syrup Take 5-10 mLs by mouth every 6 (six) hours as needed for cough. 180 mL 0  . predniSONE (DELTASONE) 50 MG tablet Take 1 tablet (50 mg total) by mouth daily with breakfast. 5 tablet 0  . ciprofloxacin (CIPRO) 500 MG tablet Take 1 tablet (500 mg total) by mouth 2 (two) times daily. 10 tablet 0  . doxycycline (VIBRA-TABS) 100 MG tablet Take 1 tablet (100 mg total) by mouth 2 (two) times daily. 14 tablet 0   No facility-administered medications prior to visit.      Review of Systems Constitutional:   +++  weight loss, no night sweats, low grade  Fevers, no chills, +++fatigue,+++ lassitude. HEENT:   No headaches,  Difficulty swallowing,  Tooth/dental problems,  Sore throat,                No sneezing, itching, ear ache,  +nasal congestion, post nasal drip,   CV:  Notes  chest pain,  Orthopnea, PND, swelling in lower extremities, anasarca, dizziness, palpitations  GI  No heartburn, indigestion, abdominal pain,  nausea,    +diarrhea, no change in bowel habits, +++loss of appetite  Resp: Notes  shortness of breath with exertion and rest.    No coughing up of blood.    No chest wall deformity  Skin: sacral decub.  GU: no dysuria, change in color of urine, no urgency or frequency.  No flank pain.  MS:  Diffuse body pain  Psych: depressed      Objective:   Physical Exam  BP 98/62 mmHg  Pulse 77  Temp(Src) 98.3 F (36.8 C) (Oral)  Ht 5\' 3"  (1.6 m)  Wt 103 lb (46.72 kg)  BMI 18.25 kg/m2   SpO2 98%  Wt Readings from Last 3 Encounters:  09/11/14 103 lb (46.72 kg)  08/16/14 106 lb 0.7 oz (48.1 kg)  08/17/14 106 lb (48.081 kg)   Gen: Thin and cachectic white female  ENT: No lesions,  mouth clear,  oropharynx clear,   Neck: No JVD, no TMG, no  carotid bruits  Lungs: No use of accessory muscles, no dullness to percussion, coarse BS w/ few rhonchi esp RLL.  Cardiovascular: RRR, heart sounds normal, no murmur or gallops, no peripheral edema  Abdomen: soft and NT, no HSM,  BS normal  Musculoskeletal: No deformities, no cyanosis or clubbing  Neuro: alert, non focal  Skin: dressing on sacral decub      All labs, xrays from recent hosp stay reviewed and are in epic system  No results found.  Assessment & Plan:   BRONCHIECTASIS Chronic bronchiectasis with severe airway obstruction and restriction Recent hosp stay of dehydration, weakness, FTT, decubitus in gluteal area Severe prot cal malnutrition Plan Cont curr neb Rx Cont supplements  Continue current oxygen therapy The patient has cycled out of the hospice program      Sacral decubitus ulcer Decubitus ulcer Cont wound care per home RN   Muscular deconditioning Severe deconditioning   Encounter for palliative care Now out of hospice care Pt still  Is DNR   Severe protein-calorie malnutrition Nutritional supp recommended    I had an extended discussion with the patient reviewing all relevant studies completed to date and  lasting 15 to 20 minutes of a 25 minute visit on the following ongoing concerns:    Re: disease process, hospice status, new plan of care   Updated Medication List Outpatient Encounter Prescriptions as of 09/11/2014  Medication Sig  . albuterol (PROVENTIL) (2.5 MG/3ML) 0.083% nebulizer solution Take 3 mLs (2.5 mg total) by nebulization every 6 (six) hours as needed (respiratory distress).  Marland Kitchen albuterol (VENTOLIN HFA) 108 (90 BASE) MCG/ACT inhaler Inhale 2 puffs into the lungs  every 6 (six) hours as needed.  . Aspirin-Salicylamide-Caffeine (BC HEADACHE POWDER PO) Take 1 tablet by mouth daily as needed (headache).   . citalopram (CELEXA) 40 MG tablet TAKE ONE (1) TABLET BY MOUTH EVERY DAY  . feeding supplement, ENSURE COMPLETE, (ENSURE COMPLETE) LIQD Take 237 mLs by mouth 2 (two) times daily between meals.  . fluticasone (FLONASE) 50 MCG/ACT nasal spray 2 sprays each nostril daily-hospice  . guaiFENesin-codeine (CHERATUSSIN AC) 100-10 MG/5ML syrup Take 5-10 mLs by mouth every 6 (six) hours as needed for cough.  . levothyroxine (SYNTHROID, LEVOTHROID) 100 MCG tablet Take 0.5 tablets (50 mcg total) by mouth daily before breakfast.  . loperamide (IMODIUM A-D) 2 MG tablet Take 2 mg by mouth daily as needed for diarrhea or loose stools (diarrhea).   . loratadine (CLARITIN) 10 MG tablet Take 10 mg by mouth daily as needed for allergies (allergies).   . naproxen (NAPROSYN) 250 MG tablet Take 1 tablet by mouth 2 (two) times daily.  Marland Kitchen nystatin-triamcinolone (MYCOLOG II) cream Apply 1 application topically 2 (two) times daily as needed. To the affected area  . Oxycodone HCl 10 MG TABS Take 1-2 tablets (10-20 mg total) by mouth every 4 (four) hours as needed (pain).  . prochlorperazine (COMPAZINE) 10 MG tablet TAKE ONE (1) TABLET BY MOUTH EVERY 6 HOURS AS NEEDED FOR NAUSEA  . QC SENNA-S 8.6-50 MG per tablet TAKE 1 TO 5 TABLETS BY MOUTH ONE TO TWO TIMES DAILY AS NEEDED FOR CONSTIPATION  . traZODone (DESYREL) 100 MG tablet Take 2 tablets (200 mg total) by mouth at bedtime.  . triamcinolone (KENALOG) 0.025 % cream APPLY TO AFFECTED AREA THREE TIMES DAILYAS NEEDED  . [DISCONTINUED] albuterol (VENTOLIN HFA) 108 (90 BASE) MCG/ACT inhaler Inhale 2 puffs into the lungs every 6 (six) hours as needed.  . [DISCONTINUED] guaiFENesin-codeine (CHERATUSSIN AC)  100-10 MG/5ML syrup Take 5-10 mLs by mouth every 6 (six) hours as needed for cough.  . [DISCONTINUED] predniSONE (DELTASONE) 50 MG tablet  Take 1 tablet (50 mg total) by mouth daily with breakfast.  . [DISCONTINUED] ciprofloxacin (CIPRO) 500 MG tablet Take 1 tablet (500 mg total) by mouth 2 (two) times daily.  . [DISCONTINUED] doxycycline (VIBRA-TABS) 100 MG tablet Take 1 tablet (100 mg total) by mouth 2 (two) times daily.         Updated Medication List Outpatient Encounter Prescriptions as of 09/11/2014  Medication Sig  . albuterol (PROVENTIL) (2.5 MG/3ML) 0.083% nebulizer solution Take 3 mLs (2.5 mg total) by nebulization every 6 (six) hours as needed (respiratory distress).  Marland Kitchen albuterol (VENTOLIN HFA) 108 (90 BASE) MCG/ACT inhaler Inhale 2 puffs into the lungs every 6 (six) hours as needed.  . Aspirin-Salicylamide-Caffeine (BC HEADACHE POWDER PO) Take 1 tablet by mouth daily as needed (headache).   . citalopram (CELEXA) 40 MG tablet TAKE ONE (1) TABLET BY MOUTH EVERY DAY  . feeding supplement, ENSURE COMPLETE, (ENSURE COMPLETE) LIQD Take 237 mLs by mouth 2 (two) times daily between meals.  . fluticasone (FLONASE) 50 MCG/ACT nasal spray 2 sprays each nostril daily-hospice  . guaiFENesin-codeine (CHERATUSSIN AC) 100-10 MG/5ML syrup Take 5-10 mLs by mouth every 6 (six) hours as needed for cough.  . levothyroxine (SYNTHROID, LEVOTHROID) 100 MCG tablet Take 0.5 tablets (50 mcg total) by mouth daily before breakfast.  . loperamide (IMODIUM A-D) 2 MG tablet Take 2 mg by mouth daily as needed for diarrhea or loose stools (diarrhea).   . loratadine (CLARITIN) 10 MG tablet Take 10 mg by mouth daily as needed for allergies (allergies).   . naproxen (NAPROSYN) 250 MG tablet Take 1 tablet by mouth 2 (two) times daily.  Marland Kitchen nystatin-triamcinolone (MYCOLOG II) cream Apply 1 application topically 2 (two) times daily as needed. To the affected area  . Oxycodone HCl 10 MG TABS Take 1-2 tablets (10-20 mg total) by mouth every 4 (four) hours as needed (pain).  . prochlorperazine (COMPAZINE) 10 MG tablet TAKE ONE (1) TABLET BY MOUTH EVERY 6 HOURS  AS NEEDED FOR NAUSEA  . QC SENNA-S 8.6-50 MG per tablet TAKE 1 TO 5 TABLETS BY MOUTH ONE TO TWO TIMES DAILY AS NEEDED FOR CONSTIPATION  . traZODone (DESYREL) 100 MG tablet Take 2 tablets (200 mg total) by mouth at bedtime.  . triamcinolone (KENALOG) 0.025 % cream APPLY TO AFFECTED AREA THREE TIMES DAILYAS NEEDED  . [DISCONTINUED] albuterol (VENTOLIN HFA) 108 (90 BASE) MCG/ACT inhaler Inhale 2 puffs into the lungs every 6 (six) hours as needed.  . [DISCONTINUED] guaiFENesin-codeine (CHERATUSSIN AC) 100-10 MG/5ML syrup Take 5-10 mLs by mouth every 6 (six) hours as needed for cough.  . [DISCONTINUED] predniSONE (DELTASONE) 50 MG tablet Take 1 tablet (50 mg total) by mouth daily with breakfast.  . [DISCONTINUED] ciprofloxacin (CIPRO) 500 MG tablet Take 1 tablet (500 mg total) by mouth 2 (two) times daily.  . [DISCONTINUED] doxycycline (VIBRA-TABS) 100 MG tablet Take 1 tablet (100 mg total) by mouth 2 (two) times daily.

## 2014-09-11 NOTE — Patient Instructions (Signed)
Sputum culture to be obtained Refill on albuterol sent No change in medications Refill on cough syrup Return 3 months

## 2014-09-12 NOTE — Assessment & Plan Note (Signed)
Chronic bronchiectasis with severe airway obstruction and restriction Recent hosp stay of dehydration, weakness, FTT, decubitus in gluteal area Severe prot cal malnutrition Plan Cont curr neb Rx Cont supplements  Continue current oxygen therapy The patient has cycled out of the hospice program

## 2014-09-12 NOTE — Assessment & Plan Note (Signed)
Severe deconditioning

## 2014-09-12 NOTE — Assessment & Plan Note (Signed)
Now out of hospice care Pt still  Is DNR

## 2014-09-12 NOTE — Assessment & Plan Note (Signed)
Decubitus ulcer Cont wound care per home RN

## 2014-09-12 NOTE — Assessment & Plan Note (Signed)
Nutritional supp recommended

## 2014-09-13 ENCOUNTER — Telehealth: Payer: Self-pay | Admitting: Critical Care Medicine

## 2014-09-13 NOTE — Telephone Encounter (Signed)
Noted  

## 2014-09-13 NOTE — Telephone Encounter (Signed)
Return call.Stanley A Dalton °

## 2014-09-13 NOTE — Telephone Encounter (Signed)
Called [facility]- LM with Lupita LeashDonna (physical therapy) to return call.

## 2014-09-19 ENCOUNTER — Other Ambulatory Visit: Payer: Commercial Managed Care - HMO

## 2014-09-19 DIAGNOSIS — J479 Bronchiectasis, uncomplicated: Secondary | ICD-10-CM

## 2014-09-19 DIAGNOSIS — R058 Other specified cough: Secondary | ICD-10-CM

## 2014-09-19 DIAGNOSIS — R05 Cough: Secondary | ICD-10-CM

## 2014-09-20 ENCOUNTER — Telehealth: Payer: Self-pay | Admitting: Critical Care Medicine

## 2014-09-20 NOTE — Telephone Encounter (Signed)
[facility]cb 1 [facility]

## 2014-09-20 NOTE — Telephone Encounter (Signed)
[name] States that is more short of breath than normal. Currently is on 3L/min - O2 readings have been running 89%-97%. Has been more forgetful lately. Does not report any further symptoms.  Allergies  Allergen Reactions  . Ambien [Zolpidem Tartrate]     Sleep walking and altered mental status  . Doxycycline     vomiting  . Levofloxacin Other (See Comments)    Unknown rxn.  Tolerates Cipro  . Mometasone Furoate Other (See Comments)     nasonex causes swollen throat, thrush  . Penicillins Other (See Comments)    Unknown reaction noted on hospice paperwork  . Sulfonamide Derivatives Other (See Comments)    Unknown reaction noted on hospice paperwork    CY - please advise. Thanks.

## 2014-09-20 NOTE — Telephone Encounter (Signed)
See if she is mobile enough that she could be seen before the weekend by her PCP or by TP

## 2014-09-20 NOTE — Telephone Encounter (Signed)
lmtcb for Beth. 

## 2014-09-20 NOTE — Telephone Encounter (Signed)
Spoke with Waynetta SandyBeth, states that pt is mobile enough for an appt but pt's daughter drives her and has been having lots of her own health problems.  They have been having to cancel several appointments for pt due to this.   Spoke with pt's daughter, scheduled for tomorrow morning with KC.  Nothing further needed.

## 2014-09-21 ENCOUNTER — Ambulatory Visit: Payer: Commercial Managed Care - HMO | Admitting: Pulmonary Disease

## 2014-09-24 ENCOUNTER — Telehealth: Payer: Self-pay | Admitting: Critical Care Medicine

## 2014-09-24 LAB — RESPIRATORY CULTURE OR RESPIRATORY AND SPUTUM CULTURE

## 2014-09-24 NOTE — Telephone Encounter (Signed)
Shows pseudomonas, multi resistent

## 2014-09-24 NOTE — Telephone Encounter (Signed)
Pt's daughter would like results of sputum culture.  PW - please advise. Thanks.

## 2014-09-24 NOTE — Telephone Encounter (Signed)
Left message for daughter to call back.  

## 2014-09-25 MED ORDER — CIPROFLOXACIN HCL 750 MG PO TABS: 750.0000 mg | ORAL_TABLET | Freq: Two times a day (BID) | ORAL | Status: DC

## 2014-09-25 NOTE — Telephone Encounter (Signed)
Dr. Delford FieldWright are there any recommendations for this patient with these results?

## 2014-09-25 NOTE — Telephone Encounter (Signed)
Ok to try cipro 750mg  bid x 10days

## 2014-09-25 NOTE — Telephone Encounter (Signed)
Called and spoke to Brooke [name]. Informed Brooke [name] of the results and recs per PW. Rx sent to preferred pharmacy. Brooke [name] verbalized understanding and denied any further questions or concerns at this time.

## 2014-09-25 NOTE — Telephone Encounter (Signed)
[facility]icki (Emergency Contact) [phone] Judie Petit(M)

## 2014-09-26 ENCOUNTER — Telehealth: Payer: Self-pay | Admitting: Critical Care Medicine

## 2014-09-26 DIAGNOSIS — J479 Bronchiectasis, uncomplicated: Secondary | ICD-10-CM

## 2014-09-26 NOTE — Telephone Encounter (Signed)
Order entered for Respiratory assessment for small portable oxygen tanks.  Nothing further needed.

## 2014-10-05 ENCOUNTER — Other Ambulatory Visit: Payer: Self-pay | Admitting: Critical Care Medicine

## 2014-10-30 ENCOUNTER — Telehealth: Payer: Self-pay | Admitting: Critical Care Medicine

## 2014-10-30 NOTE — Telephone Encounter (Signed)
Oxycodone last refilled 2/101/16 #180 x 0 refills Take 1-2 tablets (10-20 mg total) by mouth every 4 (four) hours as needed (pain).  Please advise PW thanks

## 2014-10-30 NOTE — Telephone Encounter (Signed)
She was to obtain a PCP for this , has this not yet occurred. WHen she left hospice they were to arrange this for pain management

## 2014-10-30 NOTE — Telephone Encounter (Signed)
Pt is aware of PW's recommendation. Nothing further was needed. 

## 2014-11-07 ENCOUNTER — Telehealth: Payer: Self-pay | Admitting: Critical Care Medicine

## 2014-11-07 NOTE — Telephone Encounter (Signed)
While speaking with Brooke [name] (see other telephone encounter) she asks that PW write a letter for her. She is trying to apply for Medicaid and will need something explaining her condition and how severe it is. Wants put into the letter that she has been admitted to Hospice twice.  PW - please advise. Thanks.

## 2014-11-07 NOTE — Telephone Encounter (Signed)
Pt is aware of how to spell Bronchiectasis. Nothing further was needed.

## 2014-11-07 NOTE — Telephone Encounter (Signed)
I am ok with writing some type of letter

## 2014-11-13 ENCOUNTER — Other Ambulatory Visit: Payer: Self-pay | Admitting: Critical Care Medicine

## 2014-11-13 ENCOUNTER — Telehealth: Payer: Self-pay | Admitting: Critical Care Medicine

## 2014-11-13 NOTE — Telephone Encounter (Signed)
Called and spoke to pt. [facility]. Pt stated Crystal is aware of letter. Phone note already generated regarding letter. Please refer to phone note from 11/07/2014. Will sign off.

## 2014-11-14 ENCOUNTER — Encounter: Payer: Self-pay | Admitting: *Deleted

## 2014-11-14 NOTE — Telephone Encounter (Signed)
Called and spoke to pt. Faxed letter to number provided. Informed her of the letter beingfaxed. Pt verbalized understanding and denied any further questions or concerns at this time.

## 2014-11-14 NOTE — Telephone Encounter (Signed)
Pt calling with fax # [phone] please call pt when this has been done.Caren GriffinsStanley A Dalton

## 2014-11-14 NOTE — Telephone Encounter (Signed)
Letter completed.  Will have PW review and sign.

## 2014-11-14 NOTE — Telephone Encounter (Signed)
Letter signed by PW and placed on board in triage. lmomtcb for pt - does she want letter mailed to her?

## 2014-11-14 NOTE — Telephone Encounter (Signed)
Spoke with pt.  She is pleased with letter and would like letter faxed to [employer].  However, she doesn't have the fax # and is requesting to call back with this information.  Letter in triage.

## 2014-11-26 ENCOUNTER — Telehealth: Payer: Self-pay | Admitting: Critical Care Medicine

## 2014-11-26 NOTE — Telephone Encounter (Signed)
Letter that was drafted on 11/14/14 has been faxed to [facility] at [facility]. Pt is aware. Nothing further was needed.

## 2014-11-28 ENCOUNTER — Telehealth: Payer: Self-pay | Admitting: Critical Care Medicine

## 2014-11-28 NOTE — Telephone Encounter (Signed)
LMTCB x 1 

## 2014-11-30 NOTE — Telephone Encounter (Signed)
lmtcb X2 for pt.  

## 2014-12-03 ENCOUNTER — Other Ambulatory Visit: Payer: Self-pay | Admitting: Critical Care Medicine

## 2014-12-03 NOTE — Telephone Encounter (Signed)
  Lorel MonacoLindsay C Lemons, CMA at 11/26/2014 3:58 PM     Status: Signed       Expand All Collapse All   Letter that was drafted on 11/14/14 [facility]. Pt is aware. Nothing further was needed.       LM for patient to make aware that letter has already been written and faxed to [facility].

## 2014-12-03 NOTE — Telephone Encounter (Signed)
LMTCB x 2 Will call one more time and then close message

## 2014-12-03 NOTE — Telephone Encounter (Signed)
Will hold in triage to call back this afternoon.

## 2014-12-03 NOTE — Telephone Encounter (Signed)
Pt returned call.  Call back this afternoon please.(phone dead) [phone]

## 2014-12-04 NOTE — Telephone Encounter (Signed)
Pt aware and nothing further needed 

## 2015-01-01 ENCOUNTER — Telehealth: Payer: Self-pay | Admitting: Critical Care Medicine

## 2015-01-01 NOTE — Telephone Encounter (Signed)
Called spoke with [place] from [facility]. Pt needs to be re qualified for her O2 at tomorrow's visit. FYI for crystal and [name]

## 2015-01-01 NOTE — Telephone Encounter (Signed)
noted 

## 2015-01-01 NOTE — Telephone Encounter (Signed)
Noted, note placed on appt notes as a reminder.

## 2015-01-02 ENCOUNTER — Ambulatory Visit (INDEPENDENT_AMBULATORY_CARE_PROVIDER_SITE_OTHER): Payer: Commercial Managed Care - HMO | Admitting: Critical Care Medicine

## 2015-01-02 ENCOUNTER — Encounter: Payer: Self-pay | Admitting: Critical Care Medicine

## 2015-01-02 VITALS — BP 90/60 | HR 70 | Temp 98.7°F | Ht 64.0 in | Wt 108.0 lb

## 2015-01-02 DIAGNOSIS — J471 Bronchiectasis with (acute) exacerbation: Secondary | ICD-10-CM

## 2015-01-02 DIAGNOSIS — J9611 Chronic respiratory failure with hypoxia: Secondary | ICD-10-CM

## 2015-01-02 DIAGNOSIS — G894 Chronic pain syndrome: Secondary | ICD-10-CM | POA: Diagnosis not present

## 2015-01-02 MED ORDER — ALPRAZOLAM 0.5 MG PO TABS: 0.5000 mg | ORAL_TABLET | ORAL | Status: DC | PRN

## 2015-01-02 MED ORDER — CIPROFLOXACIN HCL 500 MG PO TABS: 500.0000 mg | ORAL_TABLET | Freq: Two times a day (BID) | ORAL | Status: DC

## 2015-01-02 MED ORDER — MELOXICAM 15 MG PO TABS: 15.0000 mg | ORAL_TABLET | Freq: Every day | ORAL | Status: DC

## 2015-01-02 NOTE — Telephone Encounter (Signed)
Ambulatory walk documented from today.  ONO ordered. Staff msg sent to [facility] with [facility] advising of above.

## 2015-01-02 NOTE — Progress Notes (Signed)
Subjective:    Patient ID: Brooke [name], female    DOB: 11/07/1940, 74 y.o.   MRN: [id number]  HPI 01/02/2015 Chief Complaint  Patient presents with  . Follow-up    PT c/o prod cough with green mucus. Chest tightness/congestion. Occasional wheezing and SOB. Low grade fever.   Pt set up PCP and did not f/u.  Did not agree with pain control management.  Daughter abusing meds and taking pt pain meds and the patient is not really responding to narcotic cough meds.  She has been off oxycodone for 6 weeks with no ill toward effect  She is coughing up a lot of mucus thick green.  Pt denies any significant sore throat, nasal congestion or excess secretions, fever, chills, sweats, unintended weight loss, pleurtic or exertional chest pain, orthopnea PND, or leg swelling Pt denies any increase in rescue therapy over baseline, denies waking up needing it or having any early am or nocturnal exacerbations of coughing/wheezing/or dyspnea. Pt also denies any obvious fluctuation in symptoms with  weather or environmental change or other alleviating or aggravating factors   Current Medications, Allergies, Complete Past Medical History, Past Surgical History, Family History, and Social History were reviewed in [employer] electronic medical record per todays encounter:  01/02/2015  Review of Systems  Constitutional: Negative.   HENT: Negative.  Negative for ear pain, postnasal drip, rhinorrhea, sinus pressure, sore throat, trouble swallowing and voice change.   Eyes: Negative.   Respiratory: Positive for cough, shortness of breath and wheezing. Negative for apnea, choking, chest tightness and stridor.   Cardiovascular: Positive for chest pain. Negative for palpitations and leg swelling.  Gastrointestinal: Negative.  Negative for nausea, vomiting, abdominal pain and abdominal distention.  Genitourinary: Negative.   Musculoskeletal: Negative.  Negative for myalgias and arthralgias.  Skin: Negative.   Negative for rash.  Allergic/Immunologic: Negative.  Negative for environmental allergies and food allergies.  Neurological: Negative.  Negative for dizziness, syncope, weakness and headaches.  Hematological: Negative.  Negative for adenopathy. Does not bruise/bleed easily.  Psychiatric/Behavioral: Negative.  Negative for sleep disturbance and agitation. The patient is not nervous/anxious.        Objective:   Physical Exam Filed Vitals:   01/02/15 1032 01/02/15 1035  BP: 90/60   Pulse: 70   Temp:  98.7 F (37.1 C)  TempSrc:  Oral  Height:   (1.626 m)  Weight:  108 lb (48.988 kg)  SpO2: 96% 96%    Gen: Pleasant, well-nourished, in no distress,  normal affect  ENT: No lesions,  mouth clear,  oropharynx clear, no postnasal drip  Neck: No JVD, no TMG, no carotid bruits  Lungs: No use of accessory muscles, no dullness to percussion, exp wheezes, coarse rhonchi  Cardiovascular: RRR, heart sounds normal, no murmur or gallops, no peripheral edema  Abdomen: soft and NT, no HSM,  BS normal  Musculoskeletal: No deformities, no cyanosis or clubbing  Neuro: alert, non focal  Skin: Warm, no lesions or rashes  No results found.        Assessment & Plan:  I personally reviewed all images and lab data in the [facility] system as well as any outside material available during this office visit and agree with the  radiology impressions.   Chronic pain syndrome Chronic chest wall pain.   Off oxycodone since 11/2014 Daughter steals narcotics.   Pt likely has not been taking oxycodone in reality.  I suspect daughter takes her meds , this is a strange  symbiotic relationship.  In truth the pt mostly has severe anxiety and associated perceived pain  Plan I told pt i would not refill oxycodone.  Stop oxycodone for now, use ibuprofen or tylenol for pain, also gave the pt a Rx for Mobic   BRONCHIECTASIS Bronchiectasis with flare.  Oxygen dependent.  Note codeine based cough syrup  ineffective and concern daughter is stealing narcotics from patient Plan Rx cipro x 10 days Cont inhaled meds Cont oxygen qhs.  D/c hydrocodone cough syrup Note with ambulation on RA sats no lower than 89%.   However:  This patient continues to use and benefit from her oxygen therapy and has re qualified at this visit for continued oxygen therapy   Chronic respiratory failure Cont oxygen therapy. This patient continues to use and benefit from her oxygen therapy and has re qualified at this visit for continued oxygen therapy  chk ONO on RA   Brooke [name] was seen today for follow-up.  Diagnoses and all orders for this visit:  Chronic pain syndrome  Bronchiectasis with acute exacerbation Orders: -     Pulse oximetry, overnight; Future  Chronic respiratory failure with hypoxia  Other orders -     ALPRAZolam (XANAX) 0.5 MG tablet; Take 1 tablet (0.5 mg total) by mouth as needed. -     ciprofloxacin (CIPRO) 500 MG tablet; Take 1 tablet (500 mg total) by mouth 2 (two) times daily. -     meloxicam (MOBIC) 15 MG tablet; Take 1 tablet (15 mg total) by mouth daily.    I had an extended discussion with the patient and or family lasting 10 minutes of a 25 minute visit including:  Need to be off oxycodone and no further cough syrup, need for abx for bronchiectasis flare

## 2015-01-02 NOTE — Patient Instructions (Signed)
Take cipro one twice daily for 10days Try Mobic one daily for chest wall pain I cannot refill the oxycodone  No other changes in inhalers Xanax was refilled Advise you to continue to see dr Jillyn Hidden Return 3 months for lung followup with new lung MD

## 2015-01-04 NOTE — Assessment & Plan Note (Signed)
Cont oxygen therapy. This patient continues to use and benefit from her oxygen therapy and has re qualified at this visit for continued oxygen therapy

## 2015-01-04 NOTE — Assessment & Plan Note (Signed)
Bronchiectasis with flare.  Oxygen dependent. Plan Rx cipro x 10 days Cont inhaled meds Cont oxygen qhs.  Note with ambulation on RA sats no lower than 89%.   However:  This patient continues to use and benefit from her oxygen therapy and has re qualified at this visit for continued oxygen therapy

## 2015-01-04 NOTE — Assessment & Plan Note (Signed)
Chronic chest wall pain.   Off oxycodone since 11/2014 Daughter steals narcotics.   Pt likely has not been taking oxycodone in reality.  I suspect daughter takes her meds , this is a strange symbiotic relationship.  In truth the pt mostly has severe anxiety and associated perceived pain  Plan I told pt i would not refill oxycodone.  Stop oxycodone for now, use ibuprofen or tylenol for pain

## 2015-01-18 ENCOUNTER — Other Ambulatory Visit: Payer: Self-pay | Admitting: Critical Care Medicine

## 2015-01-21 ENCOUNTER — Other Ambulatory Visit: Payer: Self-pay | Admitting: Critical Care Medicine

## 2015-01-21 ENCOUNTER — Telehealth: Payer: Self-pay | Admitting: Critical Care Medicine

## 2015-01-21 DIAGNOSIS — J9611 Chronic respiratory failure with hypoxia: Secondary | ICD-10-CM

## 2015-01-21 MED ORDER — CEFUROXIME AXETIL 500 MG PO TABS: 500.0000 mg | ORAL_TABLET | Freq: Two times a day (BID) | ORAL | Status: DC

## 2015-01-21 NOTE — Telephone Encounter (Signed)
Dr. Craige Cotta when trying to send in ZPAK I received the same drug to drug interaction: Additive QT interval prolongation may occur during coadministration of citalopram and azithromycin. Coadministration of citalopram and azithromycin is not recommended.   Please advise Dr. Craige Cotta thanks

## 2015-01-21 NOTE — Telephone Encounter (Signed)
Called pt and made aware of recs. Order placed to [facility]  When attempting to send in the ABX received a drug to drug interaction: Warning: Additive QT interval prolongation may occur during coadministration of citalopram and moxifloxacin. Coadministration of citalopram and moxifloxacin is not recommended  Please advise PW thanks

## 2015-01-21 NOTE — Telephone Encounter (Signed)
PW has not responded. Please advise Dr. Craige Cotta thanks

## 2015-01-21 NOTE — Telephone Encounter (Signed)
Can send script for zpak in place of avelox.

## 2015-01-21 NOTE — Telephone Encounter (Signed)
Would change to cefuroxime  bid x 5 days

## 2015-01-21 NOTE — Telephone Encounter (Signed)
Spoke with pt.  Discussed below ONO results per [name].  Pt states she is currently using 3lpm o2 qhs and would like to clarify with PW that she can decrease this to 2lpm.  [name], please advise.  Pt states she finished 10 day cipro rx approx 1 wk ago.  States cough worsened while on abx.  States cough isn't quite as bad right now but still has "terrible" green mucus that hasn't gotten any better since OV.  Temp has been around 99.1 per pt.  Requesting further recs.  Also, pt would like PW to know she is unable to get any type of pain management d/t insurance.

## 2015-01-21 NOTE — Telephone Encounter (Signed)
RX has been called in and pt aware. Nothing further needed 

## 2015-01-21 NOTE — Telephone Encounter (Signed)
Let pt know ONO on RA was abnormal She still needs oxygen 2L at night

## 2015-01-21 NOTE — Telephone Encounter (Signed)
Call in avelox  daily for 7 days Ok to be at 2lpm

## 2015-01-22 ENCOUNTER — Telehealth: Payer: Self-pay | Admitting: Critical Care Medicine

## 2015-01-22 MED ORDER — PANTOPRAZOLE SODIUM 40 MG PO TBEC: 40.0000 mg | DELAYED_RELEASE_TABLET | Freq: Every day | ORAL | Status: DC

## 2015-01-22 NOTE — Telephone Encounter (Signed)
Pt c/o increased cough and SOB, feels related to acid reflux.  Still having mucus production, green in color.  Thinks that she has irritated her esophagus somehow and is now effecting her breathing.  Pt states that she feels a constant burning and pain in her throat/esophagus -- states that she can feel the acid pouring up into her throat.  Pt reports not being able to eat - decreased appetite.  Pt has tried OTC Prevacid, Tums, Rolaids, DayQuil, Nyquil, chloraseptic throat spray and cough drops -- none of which are working.   Please advise Dr Delford Field. Thanks.  Allergies  Allergen Reactions  . Ambien [Zolpidem Tartrate]     Sleep walking and altered mental status  . Doxycycline     vomiting  . Levofloxacin Other (See Comments)    Unknown rxn.  Tolerates Cipro  . Mometasone Furoate Other (See Comments)     nasonex causes swollen throat, thrush  . Penicillins Other (See Comments)    Unknown reaction noted on hospice paperwork  . Sulfonamide Derivatives Other (See Comments)    Unknown reaction noted on hospice paperwork

## 2015-01-22 NOTE — Telephone Encounter (Signed)
Call in protonix  daily .  Generic ok

## 2015-01-22 NOTE — Telephone Encounter (Signed)
Spoke with patient, aware of rec's per PW Rx called into pharmacy. Nothing further needed.

## 2015-01-30 ENCOUNTER — Encounter: Payer: Self-pay | Admitting: Critical Care Medicine

## 2015-02-12 ENCOUNTER — Other Ambulatory Visit: Payer: Self-pay | Admitting: Critical Care Medicine

## 2015-02-12 NOTE — Telephone Encounter (Signed)
Received xanax 0.5 mg rx request from [facility]. Pt last seen by PW on January 02, 2015 at which time rx given for #30 x 1 with instructions to use po prn. Called [facility], spoke with Aneta Mins.  Was advised because rx was written as po prn, they have the to be a 7 day supply.  Pt filled rx on January 02, 2015 and again on 01/09/2015.   Spoke with [name].  Ok to refill for #30 x 1 with directions to take 1 tab po bid prn.  Spoke with Aneta Mins again with [facility].  Gave VO for xanax rx.  Aneta Mins verbalized understanding and voiced no further questions or concerns at this time.

## 2015-02-25 ENCOUNTER — Other Ambulatory Visit: Payer: Self-pay | Admitting: Critical Care Medicine

## 2015-04-05 ENCOUNTER — Other Ambulatory Visit: Payer: Self-pay | Admitting: Critical Care Medicine

## 2015-04-09 ENCOUNTER — Telehealth: Payer: Self-pay | Admitting: Pulmonary Disease

## 2015-04-09 MED ORDER — PREDNISONE 10 MG PO TABS: ORAL_TABLET | ORAL | Status: DC

## 2015-04-09 MED ORDER — ALPRAZOLAM 0.5 MG PO TABS: 0.5000 mg | ORAL_TABLET | Freq: Two times a day (BID) | ORAL | Status: DC | PRN

## 2015-04-09 MED ORDER — TRIAMCINOLONE ACETONIDE 0.025 % EX CREA: TOPICAL_CREAM | CUTANEOUS | Status: DC

## 2015-04-09 MED ORDER — AZITHROMYCIN 250 MG PO TABS: ORAL_TABLET | ORAL | Status: DC

## 2015-04-09 NOTE — Telephone Encounter (Signed)
Please call in azithromycin, Z-Pak Call in Prednisone: Take 40mg  daily for 3 days, then 30mg  daily for 3 days, then 20mg  daily for 3 days, then 10mg  daily for 3 days, then stop Refill Xanax 1 Follow with Dr. Jamison [facility] as planned

## 2015-04-09 NOTE — Telephone Encounter (Signed)
[facility] for Prednisone, Zpak and refill on Triamcinolone cream for her bed sores.  Refill of Xanax called into the pharmacy.  Patient notified that medications have been sent to pharmacy.  Nothing further needed.  Closing encounter

## 2015-04-09 NOTE — Telephone Encounter (Addendum)
Patient called very upset and crying.  Kelli came into triage and said that patient was telling her that she was going to kill herself.  When I spoke with patient, she was crying a lot and very upset, I asked her about suicide intentions and she said that "I may say that I am going to kill myself, but I would never do it, I fear God too much to ever kill myself".  I spoke with her on the phone for a while and she explained to me that she recently had to leave her home and move in with her daughter, she said that she is having a hard time coping with the move, she and her daughter do not get along, her daughter yells at her and does not take good care of her, she says that she has terrible bed sores all over her that are getting bigger, she has an ointment that she uses for this.  She said that anytime she asks her daughter for anything they get into a "shouting match".  She said that she would like a refill on Xanax to help her cope with this situation.  Patient also has a cough, coughing up green mucus, fever 99.2, chest tightness.  Patient is currently taking Dayquil and Nyquil for this.  Patient offered appointment, but states that she will have to check with her daughter and her daughter's boyfriend to see if she can get a ride, but she said "heaven forbid I should schedule an appointment without their permission first".  She wants to know [facility]  Patient can be reached at [phone]  Xanax last refilled 02/12/15 - #30 BID PRN, 1 refill, by Dr. Delford FieldWright, no pending appt. [facility]

## 2015-04-09 NOTE — Telephone Encounter (Signed)
lmtcb x1 for pt. 

## 2015-04-10 NOTE — Telephone Encounter (Signed)
[facility]

## 2015-05-09 ENCOUNTER — Telehealth: Payer: Self-pay | Admitting: Pulmonary Disease

## 2015-05-09 MED ORDER — AZITHROMYCIN 250 MG PO TABS: ORAL_TABLET | ORAL | Status: DC

## 2015-05-09 NOTE — Telephone Encounter (Signed)
Called and spoke to pt. Pt c/o prod cough with green mucus, oral temperature of 100.5, increase in SOB, chest tightness when SOB. Pt states her s/s have been present for 2 weeks but have gradually worsened. Pt has an acute OV with Dr. Jamison NeighborNestor on 12.9.16. Pt is a former PW pt, last seen on 7.27.16.   Dr. Maple HudsonYoung please advise. Thanks.   Allergies  Allergen Reactions  . Ambien [Zolpidem Tartrate]     Sleep walking and altered mental status  . Doxycycline     vomiting  . Levofloxacin Other (See Comments)    Unknown rxn.  Tolerates Cipro  . Mometasone Furoate Other (See Comments)     nasonex causes swollen throat, thrush  . Penicillins Other (See Comments)    Unknown reaction noted on hospice paperwork  . Sulfonamide Derivatives Other (See Comments)    Unknown reaction noted on hospice paperwork    Current Outpatient Prescriptions on File Prior to Visit  Medication Sig Dispense Refill  . albuterol (PROVENTIL) (2.5 MG/3ML) 0.083% nebulizer solution Take 3 mLs (2.5 mg total) by nebulization every 6 (six) hours as needed (respiratory distress). 360 mL 3  . albuterol (VENTOLIN HFA) 108 (90 BASE) MCG/ACT inhaler Inhale 2 puffs into the lungs every 6 (six) hours as needed. 1 Inhaler 3  . ALPRAZolam (XANAX) 0.5 MG tablet Take 1 tablet (0.5 mg total) by mouth 2 (two) times daily as needed. 60 tablet 0  . Aspirin-Salicylamide-Caffeine (BC HEADACHE POWDER PO) Take 1 tablet by mouth daily as needed (headache).     Marland Kitchen. azithromycin (ZITHROMAX) 250 MG tablet Take as directed 6 tablet 0  . cefUROXime (CEFTIN) 500 MG tablet Take 1 tablet (500 mg total) by mouth 2 (two) times daily with a meal. 10 tablet 0  . ciprofloxacin (CIPRO) 500 MG tablet Take 1 tablet (500 mg total) by mouth 2 (two) times daily. 20 tablet 0  . citalopram (CELEXA) 40 MG tablet TAKE ONE (1) TABLET BY MOUTH EVERY DAY 30 tablet 2  . fluticasone (FLONASE) 50 MCG/ACT nasal spray USE 2 SPRAYS IN EACH NOSTRIL ONCE DAILY 16 g 5  . levothyroxine  (SYNTHROID, LEVOTHROID) 100 MCG tablet TAKE ONE-HALF TABLET BY MOUTH EVERY MORNING BEFORE BREAKFAST 15 tablet 0  . loperamide (IMODIUM A-D) 2 MG tablet Take 2 mg by mouth daily as needed for diarrhea or loose stools (diarrhea).     . loratadine (CLARITIN) 10 MG tablet Take 10 mg by mouth daily as needed for allergies (allergies).     . meloxicam (MOBIC) 15 MG tablet Take 1 tablet (15 mg total) by mouth daily. 30 tablet 6  . nystatin-triamcinolone (MYCOLOG II) cream Apply 1 application topically 2 (two) times daily as needed. To the affected area 30 g 6  . pantoprazole (PROTONIX) 40 MG tablet TAKE ONE (1) TABLET BY MOUTH EVERY DAY 30 tablet 4  . predniSONE (DELTASONE) 10 MG tablet Take 4 tablets with food x 3 days, 3 tablets x 3 days, 2 tablets x 3 days, 1 tablet x 3 days, then STOP 30 tablet 0  . prochlorperazine (COMPAZINE) 10 MG tablet TAKE ONE (1) TABLET BY MOUTH EVERY 6 HOURS AS NEEDED FOR NAUSEA 30 tablet 3  . traZODone (DESYREL) 100 MG tablet Take 2 tablets (200 mg total) by mouth at bedtime. 60 tablet 3  . triamcinolone (KENALOG) 0.025 % cream APPLY TO AFFECTED AREA THREE TIMES DAILYAS NEEDED 30 g 0  . WHEY PROTEIN PO Take by mouth daily.     No  current facility-administered medications on file prior to visit.

## 2015-05-09 NOTE — Telephone Encounter (Signed)
Offer Zpak 

## 2015-05-09 NOTE — Telephone Encounter (Signed)
Called spoke with pt. She was fine with ZPAK being called in. I have done so. Nothing further needed

## 2015-05-17 ENCOUNTER — Ambulatory Visit (INDEPENDENT_AMBULATORY_CARE_PROVIDER_SITE_OTHER): Payer: Medicare HMO | Admitting: Pulmonary Disease

## 2015-05-17 ENCOUNTER — Encounter: Payer: Self-pay | Admitting: Pulmonary Disease

## 2015-05-17 ENCOUNTER — Ambulatory Visit: Payer: Commercial Managed Care - HMO | Admitting: Pulmonary Disease

## 2015-05-17 VITALS — BP 122/60 | HR 80 | Wt 109.0 lb

## 2015-05-17 DIAGNOSIS — J9611 Chronic respiratory failure with hypoxia: Secondary | ICD-10-CM

## 2015-05-17 DIAGNOSIS — J479 Bronchiectasis, uncomplicated: Secondary | ICD-10-CM

## 2015-05-17 DIAGNOSIS — F1190 Opioid use, unspecified, uncomplicated: Secondary | ICD-10-CM | POA: Diagnosis not present

## 2015-05-17 MED ORDER — IPRATROPIUM-ALBUTEROL 0.5-2.5 (3) MG/3ML IN SOLN: 3.0000 mL | Freq: Three times a day (TID) | RESPIRATORY_TRACT | Status: DC

## 2015-05-17 MED ORDER — SODIUM CHLORIDE 3 % IN NEBU: INHALATION_SOLUTION | RESPIRATORY_TRACT | Status: DC

## 2015-05-17 NOTE — Progress Notes (Signed)
Subjective:    Patient ID: Brooke [name], female    DOB: 03/09/1941, 74 y.o.   MRN: [id number]  C.C.:  Follow-up for Bronchiectasis, Chronic Hypoxic Respiratory Failure, & History of Narcotic Use w/ Cough.  HPI Bronchiectasis:  Reports she has frequent coughing. She produces a "medium-green" mucus. She denies any hemoptysis. Previously has cultured out Pseudomonas as well as MAC. She is have intermittent fevers up to 100F. She does have chills at times but no sweats. She uses her nebulizer three times daily due to dyspnea. Reports she is unable to use a flutter valve.  Chronic Nocturnal Hypoxic Respiratory Failure:  She continues to use 3 L/m at night while sleeping.  Narcotic Use:  Patient previously referred to Primary Physician who had recommended pain management but didn't follow up. Prior documentation recognized that daughter was abusing medications and taking patient's pain medications. Previously patient has not responded to narcotic pain medications in the past. She denies any suicidal ideation.  Review of Systems She denies any nausea or emesis. She denies any reflux or dyspepsia. Reports she does have significant chest pain with her coughing. No other chest pain or pressure.   Allergies  Allergen Reactions  . Ambien [Zolpidem Tartrate]     Sleep walking and altered mental status  . Doxycycline     vomiting  . Levofloxacin Other (See Comments)    Unknown rxn.  Tolerates Cipro  . Mometasone Furoate Other (See Comments)     nasonex causes swollen throat, thrush  . Penicillins Other (See Comments)    Unknown reaction noted on hospice paperwork  . Sulfonamide Derivatives Other (See Comments)    Unknown reaction noted on hospice paperwork    Current Outpatient Prescriptions on File Prior to Visit  Medication Sig Dispense Refill  . albuterol (PROVENTIL) (2.5 MG/3ML) 0.083% nebulizer solution Take 3 mLs (2.5 mg total) by nebulization every 6 (six) hours as needed (respiratory  distress). 360 mL 3  . albuterol (VENTOLIN HFA) 108 (90 BASE) MCG/ACT inhaler Inhale 2 puffs into the lungs every 6 (six) hours as needed. 1 Inhaler 3  . Aspirin-Salicylamide-Caffeine (BC HEADACHE POWDER PO) Take 1 tablet by mouth daily as needed (headache).     . citalopram (CELEXA) 40 MG tablet TAKE ONE (1) TABLET BY MOUTH EVERY DAY 30 tablet 2  . fluticasone (FLONASE) 50 MCG/ACT nasal spray USE 2 SPRAYS IN EACH NOSTRIL ONCE DAILY 16 g 5  . levothyroxine (SYNTHROID, LEVOTHROID) 100 MCG tablet TAKE ONE-HALF TABLET BY MOUTH EVERY MORNING BEFORE BREAKFAST 15 tablet 0  . loratadine (CLARITIN) 10 MG tablet Take 10 mg by mouth daily as needed for allergies (allergies).     . nystatin-triamcinolone (MYCOLOG II) cream Apply 1 application topically 2 (two) times daily as needed. To the affected area 30 g 6  . pantoprazole (PROTONIX) 40 MG tablet TAKE ONE (1) TABLET BY MOUTH EVERY DAY 30 tablet 4  . prochlorperazine (COMPAZINE) 10 MG tablet TAKE ONE (1) TABLET BY MOUTH EVERY 6 HOURS AS NEEDED FOR NAUSEA 30 tablet 3  . traZODone (DESYREL) 100 MG tablet Take 2 tablets (200 mg total) by mouth at bedtime. 60 tablet 3  . triamcinolone (KENALOG) 0.025 % cream APPLY TO AFFECTED AREA THREE TIMES DAILYAS NEEDED 30 g 0  . WHEY PROTEIN PO Take by mouth daily.    Marland Kitchen ALPRAZolam (XANAX) 0.5 MG tablet Take 1 tablet (0.5 mg total) by mouth 2 (two) times daily as needed. (Patient not taking: Reported on 05/17/2015) 60 tablet 0  .  meloxicam (MOBIC) 15 MG tablet Take 1 tablet (15 mg total) by mouth daily. (Patient not taking: Reported on 05/17/2015) 30 tablet 6   No current facility-administered medications on file prior to visit.    Past Medical History  Diagnosis Date  . Hypothyroidism   . History of allergy   . Mycobacterium avium complex (HCC)   . Bronchiectasis     colonized mrsa, s/p vancomyin IV 10 days 5/10 and 7 days 6/10  . Pneumonia   . ZOXWRUEA(540.9Headache(784.0)     Past Surgical History  Procedure Laterality Date   . Cataract extraction, bilateral    . Abdominal hysterectomy    . Tonsillectomy      t and a    Family History  Problem Relation Age of Onset  . Lung disease Father   . Lung cancer Father   . Lung disease Maternal Aunt   . Parkinson's disease Maternal Aunt   . COPD Maternal Aunt   . Lung disease Maternal Uncle   . COPD Maternal Uncle     Social History   Social History  . Marital Status: Widowed    Spouse Name: N/A  . Number of Children: N/A  . Years of Education: N/A   Social History Main Topics  . Smoking status: Former Smoker -- 1.00 packs/day for 3 years    Types: Cigarettes    Quit date: 06/08/1962  . Smokeless tobacco: Never Used  . Alcohol Use: No  . Drug Use: No  . Sexual Activity: Not Asked   Other Topics Concern  . None   Social History Narrative   [facility]:   Originally [place], [place]. Father worked as a Chief Technology Officerconstruction engineer  & moved frequently. She has lived in [place] since 74 y.o. She has no pets currently. She currently lives with son-in-law & daughter. 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      Objective:   Physical Exam BP 122/60 mmHg  Pulse 80  Wt 109 lb (49.442 kg)  SpO2 95% General:  Frail female. Awake. Alert. No acute distress.  Integument:  Warm & dry. No rash on exposed skin. No bruising. HEENT:  Moist mucus membranes. No oral ulcers. No scleral injection or icterus.  Cardiovascular:  Regular rate. No edema. No appreciable JVD.  Pulmonary:  Diminished aeration bilateral lung bases. Symmetric chest wall expansion. No accessory muscle use on room air. Intermittent nonproductive coughing but speaking in complete sentences. Abdomen: Soft. Normal bowel sounds. Nondistended. Grossly nontender. Musculoskeletal:  Normal bulk and tone. Hand grip strength 5/5 bilaterally. No joint deformity or effusion appreciated.  PFT 07/05/13: FVC 0.91 L (32%) FEV1 0.64 L (30%) FEV1/FVC 0.71 if he had 25-75 0.48 L  (27%)  IMAGING CT CHEST W/O 10/09/08 (per radiologist): Small left layering pleural effusion. Bronchopneumonia left lower lobe with collapse of the right middle lobe. Patchy airspace densities bilateral upper lobes.. Mild bronchiectasis in the lower lobes. 1 cm granuloma left lower lobe with calcified nodes subcarinal and right supraclavicular region as well as calcified granulomas in the liver and spleen.  MICROBIOLOGY SPUTUM (09/19/14): Pseudomonas aeruginosa   LABS 10/30/08 IgG: 1106 IgA: 390     Assessment & Plan:  74 year old female with underlying bronchiectasis and very severe airways obstruction on prior spirometry. Patient continuing to have ongoing cough suggestive of underlying bronchiectasis. I did review her prior spirometry as well as sputum culture which last grew Pseudomonas in April of this year. She had limited symptomatic improvement on her prior  course of azithromycin. Attempting to obtain further sputum cultures to guide antibiotic therapy. I am also initiating additional inhaled medication therapy to promote airway clearance. The patient will continue using oxygen as previously prescribed. I reinforced the need for establishing with a primary care physician to address her ongoing pain and anxiety needs. I informed her that I will not be prescribing these medications.  1. Bronchiectasis with very severe airways obstruction: Prescribing DuoNeb 3 times a day and 3% hypertonic saline nebulized twice daily for airway clearance. Checking sputum culture for AFB, fungus, and bacteria. 2. Chronic hypoxic respiratory failure: Continuing on oxygen as previously prescribed at 3 L per minute while sleepy. 3. Narcotic drug use: Patient has not had significant symptomatic benefit in the past per prior records. Recommended patient establish with a primary care physician for ongoing pain and anxiety needs. 4. Follow-up: Patient to return to clinic in 3 months or sooner if needed.

## 2015-05-17 NOTE — Patient Instructions (Signed)
1. You can continue to use your albuterol in your nebulizer ever 4 hours as needed. 2. I'm starting you on Albuterol with Atrovent (Duoneb) nebulized three times daily every day. 3. I'm starting you on Hypertonic Saline 3% in your nebulizer twice daily - Remember to take your Duoneb first. Stop using it if you develop any hemoptysis. 4. Call me if you have any new problems with your coughing or breathing. I will see you back in 3 months or sooner if needed.

## 2015-05-20 ENCOUNTER — Telehealth: Payer: Self-pay | Admitting: Pulmonary Disease

## 2015-05-20 MED ORDER — IPRATROPIUM-ALBUTEROL 0.5-2.5 (3) MG/3ML IN SOLN: 3.0000 mL | Freq: Three times a day (TID) | RESPIRATORY_TRACT | Status: AC

## 2015-05-20 NOTE — Telephone Encounter (Signed)
Spoke with Daria at [facility] (ext [phone]).  She states they cannot fill Duoneb rx due to pt's insurance being [employer].  Spoke with pt and advised that rx for Duoneb and Hyeprtonic Saline were both sent to [facility].

## 2015-05-26 ENCOUNTER — Emergency Department (HOSPITAL_COMMUNITY)
Admission: EM | Admit: 2015-05-26 | Discharge: 2015-05-26 | Disposition: A | Discharge status: Home / Self Care | Payer: Medicare HMO | Attending: Emergency Medicine | Admitting: Emergency Medicine

## 2015-05-26 ENCOUNTER — Encounter (HOSPITAL_COMMUNITY): Payer: Self-pay

## 2015-05-26 ENCOUNTER — Emergency Department (HOSPITAL_COMMUNITY): Payer: Medicare HMO

## 2015-05-26 DIAGNOSIS — Z880 Allergy status to penicillin: Secondary | ICD-10-CM | POA: Diagnosis not present

## 2015-05-26 DIAGNOSIS — Z8619 Personal history of other infectious and parasitic diseases: Secondary | ICD-10-CM | POA: Diagnosis not present

## 2015-05-26 DIAGNOSIS — E039 Hypothyroidism, unspecified: Secondary | ICD-10-CM | POA: Diagnosis not present

## 2015-05-26 DIAGNOSIS — R109 Unspecified abdominal pain: Secondary | ICD-10-CM | POA: Diagnosis not present

## 2015-05-26 DIAGNOSIS — Z7951 Long term (current) use of inhaled steroids: Secondary | ICD-10-CM | POA: Insufficient documentation

## 2015-05-26 DIAGNOSIS — Z79899 Other long term (current) drug therapy: Secondary | ICD-10-CM | POA: Diagnosis not present

## 2015-05-26 DIAGNOSIS — Z8709 Personal history of other diseases of the respiratory system: Secondary | ICD-10-CM | POA: Diagnosis not present

## 2015-05-26 DIAGNOSIS — R197 Diarrhea, unspecified: Secondary | ICD-10-CM | POA: Insufficient documentation

## 2015-05-26 DIAGNOSIS — R0602 Shortness of breath: Secondary | ICD-10-CM | POA: Insufficient documentation

## 2015-05-26 DIAGNOSIS — R0981 Nasal congestion: Secondary | ICD-10-CM | POA: Insufficient documentation

## 2015-05-26 DIAGNOSIS — R05 Cough: Secondary | ICD-10-CM | POA: Diagnosis not present

## 2015-05-26 DIAGNOSIS — Z8701 Personal history of pneumonia (recurrent): Secondary | ICD-10-CM | POA: Insufficient documentation

## 2015-05-26 DIAGNOSIS — R053 Chronic cough: Secondary | ICD-10-CM

## 2015-05-26 DIAGNOSIS — Z87891 Personal history of nicotine dependence: Secondary | ICD-10-CM | POA: Diagnosis not present

## 2015-05-26 LAB — CBC
HCT: 34.7 % — ABNORMAL LOW (ref 36.0–46.0)
Hemoglobin: 11.3 g/dL — ABNORMAL LOW (ref 12.0–15.0)
MCH: 28.8 pg (ref 26.0–34.0)
MCHC: 32.6 g/dL (ref 30.0–36.0)
MCV: 88.5 fL (ref 78.0–100.0)
PLATELETS: 440 10*3/uL — AB (ref 150–400)
RBC: 3.92 MIL/uL (ref 3.87–5.11)
RDW: 15.8 % — ABNORMAL HIGH (ref 11.5–15.5)
WBC: 15.1 10*3/uL — ABNORMAL HIGH (ref 4.0–10.5)

## 2015-05-26 LAB — URINE MICROSCOPIC-ADD ON
BACTERIA UA: NONE SEEN
RBC / HPF: NONE SEEN RBC/hpf (ref 0–5)

## 2015-05-26 LAB — COMPREHENSIVE METABOLIC PANEL
ALBUMIN: 3.5 g/dL (ref 3.5–5.0)
ALK PHOS: 60 U/L (ref 38–126)
ALT: 10 U/L — AB (ref 14–54)
AST: 21 U/L (ref 15–41)
Anion gap: 10 (ref 5–15)
BUN: 13 mg/dL (ref 6–20)
CALCIUM: 9.1 mg/dL (ref 8.9–10.3)
CHLORIDE: 105 mmol/L (ref 101–111)
CO2: 23 mmol/L (ref 22–32)
CREATININE: 0.95 mg/dL (ref 0.44–1.00)
GFR calc Af Amer: >60 mL/min (ref >60)
GFR calc non Af Amer: 57 mL/min — ABNORMAL LOW (ref >60)
GLUCOSE: 94 mg/dL (ref 65–99)
Potassium: 4.1 mmol/L (ref 3.5–5.1)
SODIUM: 138 mmol/L (ref 135–145)
Total Bilirubin: 0.4 mg/dL (ref 0.3–1.2)
Total Protein: 7.5 g/dL (ref 6.5–8.1)

## 2015-05-26 LAB — URINALYSIS, ROUTINE W REFLEX MICROSCOPIC
BILIRUBIN URINE: NEGATIVE
GLUCOSE, UA: NEGATIVE mg/dL
HGB URINE DIPSTICK: NEGATIVE
Ketones, ur: NEGATIVE mg/dL
Nitrite: NEGATIVE
PROTEIN: NEGATIVE mg/dL
SPECIFIC GRAVITY, URINE: 1.015 (ref 1.005–1.030)
pH: 5 (ref 5.0–8.0)

## 2015-05-26 LAB — LIPASE, BLOOD: Lipase: 40 U/L (ref 11–51)

## 2015-05-26 MED ORDER — SODIUM CHLORIDE 0.9 % IV BOLUS (SEPSIS)
1000.0000 mL | Freq: Once | INTRAVENOUS | Status: AC
  Administered 2015-05-26: 1000 mL via INTRAVENOUS

## 2015-05-26 MED ORDER — MORPHINE SULFATE (PF) 2 MG/ML IV SOLN
2.0000 mg | Freq: Once | INTRAVENOUS | Status: AC
  Administered 2015-05-26: 2 mg via INTRAVENOUS
  Filled 2015-05-26: qty 1

## 2015-05-26 MED ORDER — IPRATROPIUM BROMIDE 0.02 % IN SOLN
0.5000 mg | Freq: Once | RESPIRATORY_TRACT | Status: AC
  Administered 2015-05-26: 0.5 mg via RESPIRATORY_TRACT
  Filled 2015-05-26: qty 2.5

## 2015-05-26 MED ORDER — ALBUTEROL SULFATE (2.5 MG/3ML) 0.083% IN NEBU
5.0000 mg | INHALATION_SOLUTION | Freq: Once | RESPIRATORY_TRACT | Status: AC
  Administered 2015-05-26: 5 mg via RESPIRATORY_TRACT
  Filled 2015-05-26: qty 6

## 2015-05-26 MED ORDER — HYDROCODONE-HOMATROPINE 5-1.5 MG/5ML PO SYRP: ORAL_SOLUTION | ORAL | Status: DC

## 2015-05-26 MED ORDER — METHYLPREDNISOLONE SODIUM SUCC 125 MG IJ SOLR
125.0000 mg | Freq: Once | INTRAMUSCULAR | Status: AC
  Administered 2015-05-26: 125 mg via INTRAVENOUS
  Filled 2015-05-26: qty 2

## 2015-05-26 MED ORDER — PREDNISONE 10 MG PO TABS: 40.0000 mg | ORAL_TABLET | Freq: Every day | ORAL | Status: DC

## 2015-05-26 NOTE — Discharge Instructions (Signed)
You were seen today for your cough and diarrhea.  Your diarrhea may be secondary to C. Diff but a culture would need to be obtained to diagnose this.  Use the cup provided and contact your primary care physician about running the culture.  Use the cough medicine prescribed to help you sleep at night and continued all medications prescribed by your lung specialist.  Try to eat small frequent meals and keep yourself well hydrated.  Return with fever or severe abdominal pain.  Cough, Adult Coughing is a reflex that clears your throat and your airways. Coughing helps to heal and protect your lungs. It is normal to cough occasionally, but a cough that happens with other symptoms or lasts a long time may be a sign of a condition that needs treatment. A cough may last only 2-3 weeks (acute), or it may last longer than 8 weeks (chronic). CAUSES Coughing is commonly caused by:  Breathing in substances that irritate your lungs.  A viral or bacterial respiratory infection.  Allergies.  Asthma.  Postnasal drip.  Smoking.  Acid backing up from the stomach into the esophagus (gastroesophageal reflux).  Certain medicines.  Chronic lung problems, including COPD (or rarely, lung cancer).  Other medical conditions such as heart failure. HOME CARE INSTRUCTIONS  Pay attention to any changes in your symptoms. Take these actions to help with your discomfort:  Take medicines only as told by your health care provider.  If you were prescribed an antibiotic medicine, take it as told by your health care provider. Do not stop taking the antibiotic even if you start to feel better.  Talk with your health care provider before you take a cough suppressant medicine.  Drink enough fluid to keep your urine clear or pale yellow.  If the air is dry, use a cold steam vaporizer or humidifier in your bedroom or your home to help loosen secretions.  Avoid anything that causes you to cough at work or at home.  If  your cough is worse at night, try sleeping in a semi-upright position.  Avoid cigarette smoke. If you smoke, quit smoking. If you need help quitting, ask your health care provider.  Avoid caffeine.  Avoid alcohol.  Rest as needed. SEEK MEDICAL CARE IF:   You have new symptoms.  You cough up pus.  Your cough does not get better after 2-3 weeks, or your cough gets worse.  You cannot control your cough with suppressant medicines and you are losing sleep.  You develop pain that is getting worse or pain that is not controlled with pain medicines.  You have a fever.  You have unexplained weight loss.  You have night sweats. SEEK IMMEDIATE MEDICAL CARE IF:  You cough up blood.  You have difficulty breathing.  Your heartbeat is very fast.   This information is not intended to replace advice given to you by your health care provider. Make sure you discuss any questions you have with your health care provider.   Document Released: 11/21/2010 Document Revised: 02/13/2015 Document Reviewed: 08/01/2014 Elsevier Interactive Patient Education 2016 Elsevier Inc.   Diarrhea Diarrhea is frequent loose and watery bowel movements. It can cause you to feel weak and dehydrated. Dehydration can cause you to become tired and thirsty, have a dry mouth, and have decreased urination that often is dark yellow. Diarrhea is a sign of another problem, most often an infection that will not last long. In most cases, diarrhea typically lasts 2-3 days. However, it can last  longer if it is a sign of something more serious. It is important to treat your diarrhea as directed by your caregiver to lessen or prevent future episodes of diarrhea. CAUSES  Some common causes include:  Gastrointestinal infections caused by viruses, bacteria, or parasites.  Food poisoning or food allergies.  Certain medicines, such as antibiotics, chemotherapy, and laxatives.  Artificial sweeteners and fructose.  Digestive  disorders. HOME CARE INSTRUCTIONS  Ensure adequate fluid intake (hydration): Have 1 cup (8 oz) of fluid for each diarrhea episode. Avoid fluids that contain simple sugars or sports drinks, fruit juices, whole milk products, and sodas. Your urine should be clear or pale yellow if you are drinking enough fluids. Hydrate with an oral rehydration solution that you can purchase at pharmacies, retail stores, and online. You can prepare an oral rehydration solution at home by mixing the following ingredients together:   - tsp table salt.   tsp baking soda.   tsp salt substitute containing potassium chloride.  1  tablespoons sugar.  1 L (34 oz) of water.  Certain foods and beverages may increase the speed at which food moves through the gastrointestinal (GI) tract. These foods and beverages should be avoided and include:  Caffeinated and alcoholic beverages.  High-fiber foods, such as raw fruits and vegetables, nuts, seeds, and whole grain breads and cereals.  Foods and beverages sweetened with sugar alcohols, such as xylitol, sorbitol, and mannitol.  Some foods may be well tolerated and may help thicken stool including:  Starchy foods, such as rice, toast, pasta, low-sugar cereal, oatmeal, grits, baked potatoes, crackers, and bagels.  Bananas.  Applesauce.  Add probiotic-rich foods to help increase healthy bacteria in the GI tract, such as yogurt and fermented milk products.  Wash your hands well after each diarrhea episode.  Only take over-the-counter or prescription medicines as directed by your caregiver.  Take a warm bath to relieve any burning or pain from frequent diarrhea episodes. SEEK IMMEDIATE MEDICAL CARE IF:   You are unable to keep fluids down.  You have persistent vomiting.  You have blood in your stool, or your stools are black and tarry.  You do not urinate in 6-8 hours, or there is only a small amount of very dark urine.  You have abdominal pain that  increases or localizes.  You have weakness, dizziness, confusion, or light-headedness.  You have a severe headache.  Your diarrhea gets worse or does not get better.  You have a fever or persistent symptoms for more than 2-3 days.  You have a fever and your symptoms suddenly get worse. MAKE SURE YOU:   Understand these instructions.  Will watch your condition.  Will get help right away if you are not doing well or get worse.   This information is not intended to replace advice given to you by your health care provider. Make sure you discuss any questions you have with your health care provider.   Document Released: 05/15/2002 Document Revised: 06/15/2014 Document Reviewed: 01/31/2012 Elsevier Interactive Patient Education [employer]2016 Elsevier Inc.

## 2015-05-26 NOTE — ED Notes (Addendum)
Pt c/o diarrhea with abdominal pain for 5 weeks. Pt also c/o constant cough of green sputum and coughing. Pt has been taking azithromycin recently a month ago. Hx of c-diff, chronic respiratory failure.

## 2015-05-26 NOTE — ED Notes (Signed)
Pt has tried on to have BM several times with no results MD made aware

## 2015-05-26 NOTE — ED Provider Notes (Signed)
CSN: [id number]     Arrival date & time 05/26/15  1635 History   First MD Initiated Contact with Patient 05/26/15 1700     Chief Complaint  Patient presents with  . Diarrhea  . Abdominal Pain  . Cough     (Consider location/radiation/quality/duration/timing/severity/associated sxs/prior Treatment) HPI Comments: 74 y.o. Female with history of bronchiectasis, pneumonia, MAC, headache presents for diarrhea x5 weeks and uncontrollable cough.  The patient states that she has been miserable because she cannot control these symptoms and because of this has not been able to eat.  She reports that over the last 5 weeks she has had multiple episodes of loose, watery, foul smelling stool.  She reports intermittent abdominal cramping but no pain.  No known fever, chills.  No vomiting.  She has at times had some nausea.  She also reports that she has had a chronic cough for even longer that is productive of greenish sputum.  She reports that she coughs so frequently it is starting to cause severe pain in the right side of her chest when she coughs.     Past Medical History  Diagnosis Date  . Hypothyroidism   . History of allergy   . Mycobacterium avium complex (HCC)   . Bronchiectasis     colonized mrsa, s/p vancomyin IV 10 days 5/10 and 7 days 6/10  . Pneumonia   . VOZDGUYQ(034.7)    Past Surgical History  Procedure Laterality Date  . Cataract extraction, bilateral    . Abdominal hysterectomy    . Tonsillectomy      t and a   Family History  Problem Relation Age of Onset  . Lung disease Father   . Lung cancer Father   . Lung disease Maternal Aunt   . Parkinson's disease Maternal Aunt   . COPD Maternal Aunt   . Lung disease Maternal Uncle   . COPD Maternal Uncle    Social History  Substance Use Topics  . Smoking status: Former Smoker -- 1.00 packs/day for 3 years    Types: Cigarettes    Quit date: 06/08/1962  . Smokeless tobacco: Never Used  . Alcohol Use: No   OB History    No  data available     Review of Systems  Constitutional: Negative for fever, chills, appetite change and fatigue.  HENT: Positive for congestion. Negative for sinus pressure, sore throat and tinnitus.   Eyes: Negative for pain and redness.  Respiratory: Positive for shortness of breath. Negative for cough and chest tightness.   Cardiovascular: Negative for chest pain and palpitations.  Gastrointestinal: Positive for diarrhea. Negative for nausea, vomiting and abdominal pain.  Genitourinary: Negative for dysuria, urgency, frequency and flank pain.       Dark colored urine  Musculoskeletal: Negative for myalgias and back pain.  Skin: Negative for rash.  Neurological: Negative for dizziness, weakness, light-headedness and headaches.  Hematological: Does not bruise/bleed easily.      Allergies  Ambien; Doxycycline; Levofloxacin; Mometasone furoate; Penicillins; and Sulfonamide derivatives  Home Medications   Prior to Admission medications   Medication Sig Start Date End Date Taking? Authorizing Provider  albuterol (PROVENTIL) (2.5 MG/3ML) 0.083% nebulizer solution Take 3 mLs (2.5 mg total) by nebulization every 6 (six) hours as needed (respiratory distress). 07/18/14   Storm Frisk, MD  albuterol (VENTOLIN HFA) 108 (90 BASE) MCG/ACT inhaler Inhale 2 puffs into the lungs every 6 (six) hours as needed. 09/11/14   Storm Frisk, MD  ALPRAZolam Prudy Feeler) 0.5  MG tablet Take 1 tablet (0.5 mg total) by mouth 2 (two) times daily as needed. Patient not taking: Reported on 05/17/2015 04/09/15   Leslye Peerobert S Byrum, MD  Aspirin-Salicylamide-Caffeine [facility](BC HEADACHE POWDER PO) Take 1 tablet by mouth daily as needed (headache).     Historical Provider, MD  citalopram (CELEXA) 40 MG tablet TAKE ONE (1) TABLET BY MOUTH EVERY DAY 06/12/14   Storm FriskPatrick E Wright, MD  fluticasone [facility](FLONASE) 50 MCG/ACT nasal spray USE 2 SPRAYS IN EACH NOSTRIL ONCE DAILY 02/12/15   Storm FriskPatrick E Wright, MD  HYDROcodone-homatropine [facility](HYCODAN) 5-1.5  MG/5ML syrup Take 5 mL by mouth as needed before bed for cough 05/26/15   Leta BaptistEmily Roe Nguyen, MD  ipratropium-albuterol (DUONEB) 0.5-2.5 (3) MG/3ML SOLN Take 3 mLs by nebulization 3 (three) times daily. 05/20/15   Roslynn AmbleJennings E Nestor, MD  levothyroxine (SYNTHROID, LEVOTHROID) 100 MCG tablet TAKE ONE-HALF TABLET BY MOUTH EVERY MORNING BEFORE BREAKFAST 12/04/14   Storm FriskPatrick E Wright, MD  loratadine (CLARITIN) 10 MG tablet Take 10 mg by mouth daily as needed for allergies (allergies).     Historical Provider, MD  meloxicam (MOBIC) 15 MG tablet Take 1 tablet (15 mg total) by mouth daily. Patient not taking: Reported on 05/17/2015 01/02/15   Storm FriskPatrick E Wright, MD  MetroNIDAZOLE (FLAGYL PO) Take by mouth as needed.    Historical Provider, MD  nystatin-triamcinolone (MYCOLOG II) cream Apply 1 application topically 2 (two) times daily as needed. To the affected area 07/24/14   Storm FriskPatrick E Wright, MD  pantoprazole (PROTONIX) 40 MG tablet TAKE ONE (1) TABLET BY MOUTH EVERY DAY 02/25/15   Storm FriskPatrick E Wright, MD  predniSONE (DELTASONE) 10 MG tablet Take 4 tablets (40 mg total) by mouth daily. 05/27/15   Leta BaptistEmily Roe Nguyen, MD  prochlorperazine (COMPAZINE) 10 MG tablet TAKE ONE (1) TABLET BY MOUTH EVERY 6 HOURS AS NEEDED FOR NAUSEA 03/28/14   Storm FriskPatrick E Wright, MD  sodium chloride HYPERTONIC 3 % nebulizer solution Use 4ML two times a day 05/17/15   Roslynn AmbleJennings E Nestor, MD  traZODone (DESYREL) 100 MG tablet Take 2 tablets (200 mg total) by mouth at bedtime. 07/18/14   Storm FriskPatrick E Wright, MD  triamcinolone (KENALOG) 0.025 % cream APPLY TO AFFECTED AREA THREE TIMES DAILYAS NEEDED 04/09/15   Leslye Peerobert S Byrum, MD  WHEY PROTEIN PO Take by mouth daily.    Historical Provider, MD   BP 131/58 mmHg  Pulse 89  Temp(Src) 98.1 F (36.7 C) (Oral)  Resp 20  SpO2 93% Physical Exam  Constitutional: She is oriented to person, place, and time. She appears cachectic. No distress.  HENT:  Head: Normocephalic and atraumatic.  Right Ear: External ear  normal.  Left Ear: External ear normal.  Nose: Nose normal.  Mouth/Throat: Oropharynx is clear and moist. No oropharyngeal exudate.  Eyes: EOM are normal. Pupils are equal, round, and reactive to light.  Neck: Normal range of motion. Neck supple.  Cardiovascular: Normal rate, regular rhythm, normal heart sounds and intact distal pulses.   No murmur heard. Pulmonary/Chest: Effort normal. No respiratory distress. She has decreased breath sounds (bilateral, symmetric). She has wheezes (bilateral, symmetric). She has no rales.  Abdominal: Soft. She exhibits no distension. There is no tenderness.  Scaphoid abdomen  Musculoskeletal: Normal range of motion. She exhibits no edema or tenderness.  Neurological: She is alert and oriented to person, place, and time.  Skin: Skin is warm and dry. No rash noted. She is not diaphoretic.  Vitals reviewed.   ED Course  Procedures (  including critical care time) Labs Review Labs Reviewed  COMPREHENSIVE METABOLIC PANEL - Abnormal; Notable for the following:    ALT 10 (*)    GFR calc non Af Amer 57 (*)    All other components within normal limits  CBC - Abnormal; Notable for the following:    WBC 15.1 (*)    Hemoglobin 11.3 (*)    HCT 34.7 (*)    RDW 15.8 (*)    Platelets 440 (*)    All other components within normal limits  URINALYSIS, ROUTINE W REFLEX MICROSCOPIC (NOT AT [facility]) - Abnormal; Notable for the following:    Leukocytes, UA SMALL (*)    All other components within normal limits  URINE MICROSCOPIC-ADD ON - Abnormal; Notable for the following:    Squamous Epithelial / LPF 0-5 (*)    All other components within normal limits  LIPASE, BLOOD    Imaging Review No results found. I have personally reviewed and evaluated these images and lab results as part of my medical decision-making.   EKG Interpretation None      MDM  Patient was seen and evaluated in stable condition.  Chronic cough and diarrhea.  Patient feeling these issues are  not allowing her to function in her life but there have not been any acute changes.  Patient was unable to give stool sample to check for C diff but was given jar to obtain sample and attempt to complete outpatient.  AAS with signs of possible gastroenteritis but stable chest xray without acute finding.  Patient afebrile with unremarkable laboratory studies.  Patient is being treated outpatient by her pulmonologist for this cough outpatient.  She was provided with Hycoden to be used before bed to try to improve sleep habits to gain strength.  No sign or indication for antibiotics at this time.  Patient is going to attempt outpatient stool sample.  Discussed all results at length with patient and her daughter who expressed understanding of results and agreement with plan of care.  Patient was discharged home in stable condition. Final diagnoses:  Diarrhea, unspecified type  Chronic cough    1. Chronic cough  2. Chronic diarrhea    Leta Baptist, MD 05/28/15 2051

## 2015-05-29 ENCOUNTER — Telehealth: Payer: Self-pay | Admitting: Pulmonary Disease

## 2015-05-29 NOTE — Telephone Encounter (Signed)
Last seen on 05/11/15 with JN  Patient Instructions     1. You can continue to use your albuterol in your nebulizer ever 4 hours as needed. 2. I'm starting you on Albuterol with Atrovent (Duoneb) nebulized three times daily every day. 3. I'm starting you on Hypertonic Saline 3% in your nebulizer twice daily - Remember to take your Duoneb first. Stop using it if you develop any hemoptysis. 4. Call me if you have any new problems with your coughing or breathing. I will see you back in 3 months or sooner if needed.       Called and spoke with patient. She c/o non prod cough, itching, and insomnia x 3 days. She feels it is due to the Albuterol and Duoneb that was given at the ov with JN on 05/17/15. She reports going to the ER on 05/26/15 for the cough and was given hycodan to take at bedtime and predisone 10mg  Take 4 tablets by mouth for 3 days that has been completed. I offered to get her worked in to see a provider but she refuses stating her daughter was in a car accident and she has no transportation. I explained to her [facility] with [facility] recs. She voiced understanding and had no further questions.   MW please JN's absence

## 2015-05-29 NOTE — Telephone Encounter (Signed)
Called and spoke with patient. Reviewed recs. She stated that she would try benadryl and would contact her PCP if it continues. She voiced understanding and had no further questions. Nothing further needed.

## 2015-05-29 NOTE — Telephone Encounter (Signed)
Most likely the itching is from something else and not the albuterol or duoneb. If it started after the hycodan that's what it is - if not I can't shed any light on it and will need eval by primary care / uc or return here with all meds in hand   In meantime ok to use benadryl otc which should help with insomnia too

## 2015-06-26 ENCOUNTER — Ambulatory Visit: Payer: Medicare PPO | Admitting: Pulmonary Disease

## 2015-11-06 ENCOUNTER — Telehealth: Payer: Self-pay | Admitting: Pulmonary Disease

## 2015-11-06 NOTE — Telephone Encounter (Signed)
This form has been located. They are needing the pt's last ov note, scan and PFT. Pt's last OV has been faxed. Nothing further was needed.

## 2015-12-12 ENCOUNTER — Emergency Department (HOSPITAL_COMMUNITY): Payer: Medicare PPO

## 2015-12-12 ENCOUNTER — Encounter (HOSPITAL_COMMUNITY): Payer: Self-pay

## 2015-12-12 ENCOUNTER — Emergency Department (HOSPITAL_COMMUNITY)
Admission: EM | Admit: 2015-12-12 | Discharge: 2015-12-12 | Disposition: A | Discharge status: Home / Self Care | Payer: Medicare PPO | Attending: Emergency Medicine | Admitting: Emergency Medicine

## 2015-12-12 DIAGNOSIS — Z7982 Long term (current) use of aspirin: Secondary | ICD-10-CM | POA: Diagnosis not present

## 2015-12-12 DIAGNOSIS — E876 Hypokalemia: Secondary | ICD-10-CM | POA: Diagnosis not present

## 2015-12-12 DIAGNOSIS — Z79899 Other long term (current) drug therapy: Secondary | ICD-10-CM | POA: Diagnosis not present

## 2015-12-12 DIAGNOSIS — R1030 Lower abdominal pain, unspecified: Secondary | ICD-10-CM | POA: Insufficient documentation

## 2015-12-12 DIAGNOSIS — J8410 Pulmonary fibrosis, unspecified: Secondary | ICD-10-CM | POA: Diagnosis not present

## 2015-12-12 DIAGNOSIS — R197 Diarrhea, unspecified: Secondary | ICD-10-CM | POA: Insufficient documentation

## 2015-12-12 DIAGNOSIS — Z87891 Personal history of nicotine dependence: Secondary | ICD-10-CM | POA: Insufficient documentation

## 2015-12-12 LAB — URINE MICROSCOPIC-ADD ON

## 2015-12-12 LAB — URINALYSIS, ROUTINE W REFLEX MICROSCOPIC
BILIRUBIN URINE: NEGATIVE
Glucose, UA: NEGATIVE mg/dL
Hgb urine dipstick: NEGATIVE
KETONES UR: NEGATIVE mg/dL
Nitrite: NEGATIVE
PROTEIN: NEGATIVE mg/dL
Specific Gravity, Urine: 1.02 (ref 1.005–1.030)
pH: 5.5 (ref 5.0–8.0)

## 2015-12-12 LAB — BASIC METABOLIC PANEL
ANION GAP: 6 (ref 5–15)
BUN: 7 mg/dL (ref 6–20)
CALCIUM: 8.9 mg/dL (ref 8.9–10.3)
CO2: 27 mmol/L (ref 22–32)
Chloride: 106 mmol/L (ref 101–111)
Creatinine, Ser: 0.86 mg/dL (ref 0.44–1.00)
GFR calc Af Amer: >60 mL/min (ref >60)
Glucose, Bld: 110 mg/dL — ABNORMAL HIGH (ref 65–99)
POTASSIUM: 3 mmol/L — AB (ref 3.5–5.1)
SODIUM: 139 mmol/L (ref 135–145)

## 2015-12-12 LAB — HEPATIC FUNCTION PANEL
ALK PHOS: 61 U/L (ref 38–126)
ALT: 8 U/L — AB (ref 14–54)
AST: 20 U/L (ref 15–41)
Albumin: 2.7 g/dL — ABNORMAL LOW (ref 3.5–5.0)
Total Bilirubin: 0.3 mg/dL (ref 0.3–1.2)
Total Protein: 7 g/dL (ref 6.5–8.1)

## 2015-12-12 LAB — I-STAT TROPONIN, ED: TROPONIN I, POC: 0 ng/mL (ref 0.00–0.08)

## 2015-12-12 LAB — CBC
HEMATOCRIT: 35.9 % — AB (ref 36.0–46.0)
Hemoglobin: 11.6 g/dL — ABNORMAL LOW (ref 12.0–15.0)
MCH: 29.2 pg (ref 26.0–34.0)
MCHC: 32.3 g/dL (ref 30.0–36.0)
MCV: 90.4 fL (ref 78.0–100.0)
PLATELETS: 466 10*3/uL — AB (ref 150–400)
RBC: 3.97 MIL/uL (ref 3.87–5.11)
RDW: 14.5 % (ref 11.5–15.5)
WBC: 13.3 10*3/uL — AB (ref 4.0–10.5)

## 2015-12-12 LAB — LIPASE, BLOOD: LIPASE: 54 U/L — AB (ref 11–51)

## 2015-12-12 MED ORDER — ACETAMINOPHEN-CODEINE 120-12 MG/5ML PO SOLN
5.0000 mL | Freq: Once | ORAL | Status: AC
  Administered 2015-12-12: 5 mL via ORAL
  Filled 2015-12-12: qty 5

## 2015-12-12 MED ORDER — POTASSIUM CHLORIDE 10 MEQ/100ML IV SOLN
10.0000 meq | Freq: Once | INTRAVENOUS | Status: AC
  Administered 2015-12-12: 10 meq via INTRAVENOUS
  Filled 2015-12-12: qty 100

## 2015-12-12 MED ORDER — POTASSIUM CHLORIDE CRYS ER 20 MEQ PO TBCR
40.0000 meq | EXTENDED_RELEASE_TABLET | Freq: Once | ORAL | Status: DC
  Filled 2015-12-12: qty 2

## 2015-12-12 MED ORDER — IOPAMIDOL (ISOVUE-300) INJECTION 61%
INTRAVENOUS | Status: AC
  Administered 2015-12-12: 100 mL
  Filled 2015-12-12: qty 100

## 2015-12-12 MED ORDER — SODIUM CHLORIDE 0.9 % IV BOLUS (SEPSIS)
1000.0000 mL | Freq: Once | INTRAVENOUS | Status: AC
  Administered 2015-12-12: 1000 mL via INTRAVENOUS

## 2015-12-12 NOTE — ED Provider Notes (Signed)
CSN: [id number]     Arrival date & time 12/12/15  1408 History   First MD Initiated Contact with Patient 12/12/15 1503     Chief Complaint  Patient presents with  . Diarrhea  . Shortness of Breath     (Consider location/radiation/quality/duration/timing/severity/associated sxs/prior Treatment) The history is provided by the patient.  Florian BuffSandy C Bendorf is a 75 y.o. female hx Bronchiectasis, Mycobacterium complex, previous C. difficile here presenting with abdominal pain, diarrhea, cough. Patient states that she has been having chronic diarrhea for the last 6 months. She goes about 5-6 times a day. States that it is watery and appears like sand in consistency. She had left over flagyl for previous C diff several years ago and has been taking it occasionally for the last month and a half and has been trying imodium with minimal relief. Denies vomiting or fever. Has persistent cough and sees pulmonologist for chronic bronchiectasis. She was on hospice previously for failure to thrive and bronchiectasis.    Past Medical History  Diagnosis Date  . Hypothyroidism   . History of allergy   . Mycobacterium avium complex (HCC)   . Bronchiectasis     colonized mrsa, s/p vancomyin IV 10 days 5/10 and 7 days 6/10  . Pneumonia   . [id number](478.2Headache(784.0)    Past Surgical History  Procedure Laterality Date  . Cataract extraction, bilateral    . Abdominal hysterectomy    . Tonsillectomy      t and a   Family History  Problem Relation Age of Onset  . Lung disease Father   . Lung cancer Father   . Lung disease Maternal Aunt   . Parkinson's disease Maternal Aunt   . COPD Maternal Aunt   . Lung disease Maternal Uncle   . COPD Maternal Uncle    Social History  Substance Use Topics  . Smoking status: Former Smoker -- 1.00 packs/day for 3 years    Types: Cigarettes    Quit date: 06/08/1962  . Smokeless tobacco: Never Used  . Alcohol Use: No   OB History    No data available     Review of Systems   Respiratory: Positive for shortness of breath.   Gastrointestinal: Positive for diarrhea.  All other systems reviewed and are negative.     Allergies  Ambien; Doxycycline; Levofloxacin; Mometasone furoate; Penicillins; and Sulfonamide derivatives  Home Medications   Prior to Admission medications   Medication Sig Start Date End Date Taking? Authorizing Provider  albuterol (PROVENTIL) (2.5 MG/3ML) 0.083% nebulizer solution Take 3 mLs (2.5 mg total) by nebulization every 6 (six) hours as needed (respiratory distress). 07/18/14   Storm FriskPatrick E Wright, MD  albuterol (VENTOLIN HFA) 108 (90 BASE) MCG/ACT inhaler Inhale 2 puffs into the lungs every 6 (six) hours as needed. 09/11/14   Storm FriskPatrick E Wright, MD  ALPRAZolam Prudy Feeler(XANAX) 0.5 MG tablet Take 1 tablet (0.5 mg total) by mouth 2 (two) times daily as needed. Patient not taking: Reported on 05/17/2015 04/09/15   Leslye Peerobert S Byrum, MD  Aspirin-Salicylamide-Caffeine [facility](BC HEADACHE POWDER PO) Take 1 tablet by mouth daily as needed (headache).     Historical Provider, MD  citalopram (CELEXA) 40 MG tablet TAKE ONE (1) TABLET BY MOUTH EVERY DAY 06/12/14   Storm FriskPatrick E Wright, MD  fluticasone [facility](FLONASE) 50 MCG/ACT nasal spray USE 2 SPRAYS IN EACH NOSTRIL ONCE DAILY 02/12/15   Storm FriskPatrick E Wright, MD  HYDROcodone-homatropine [facility](HYCODAN) 5-1.5 MG/5ML syrup Take 5 mL by mouth as needed before bed for cough  05/26/15   Leta Baptist, MD  ipratropium-albuterol (DUONEB) 0.5-2.5 (3) MG/3ML SOLN Take 3 mLs by nebulization 3 (three) times daily. 05/20/15   Roslynn Amble, MD  levothyroxine (SYNTHROID, LEVOTHROID) 100 MCG tablet TAKE ONE-HALF TABLET BY MOUTH EVERY MORNING BEFORE BREAKFAST 12/04/14   Storm Frisk, MD  loratadine (CLARITIN) 10 MG tablet Take 10 mg by mouth daily as needed for allergies (allergies).     Historical Provider, MD  meloxicam (MOBIC) 15 MG tablet Take 1 tablet (15 mg total) by mouth daily. Patient not taking: Reported on 05/17/2015 01/02/15   Storm Frisk,  MD  MetroNIDAZOLE (FLAGYL PO) Take by mouth as needed.    Historical Provider, MD  nystatin-triamcinolone (MYCOLOG II) cream Apply 1 application topically 2 (two) times daily as needed. To the affected area 07/24/14   Storm Frisk, MD  pantoprazole (PROTONIX) 40 MG tablet TAKE ONE (1) TABLET BY MOUTH EVERY DAY 02/25/15   Storm Frisk, MD  predniSONE (DELTASONE) 10 MG tablet Take 4 tablets (40 mg total) by mouth daily. 05/27/15   Leta Baptist, MD  prochlorperazine (COMPAZINE) 10 MG tablet TAKE ONE (1) TABLET BY MOUTH EVERY 6 HOURS AS NEEDED FOR NAUSEA 03/28/14   Storm Frisk, MD  sodium chloride HYPERTONIC 3 % nebulizer solution Use two times a day 05/17/15   Roslynn Amble, MD  traZODone (DESYREL) 100 MG tablet Take 2 tablets (200 mg total) by mouth at bedtime. 07/18/14   Storm Frisk, MD  triamcinolone (KENALOG) 0.025 % cream APPLY TO AFFECTED AREA THREE TIMES DAILYAS NEEDED 04/09/15   Leslye Peer, MD  WHEY PROTEIN PO Take by mouth daily.    Historical Provider, MD   BP 120/63 mmHg  Pulse 57  Temp(Src) 99.8 F (37.7 C) (Oral)  Resp 14  Ht 5\' 5"  (1.651 m)  Wt 108 lb (48.988 kg)  BMI 17.97 kg/m2  SpO2 98% Physical Exam  Constitutional: She is oriented to person, place, and time.  Chronically ill   HENT:  Head: Normocephalic.  Mouth/Throat: Oropharynx is clear and moist.  Eyes: Conjunctivae are normal. Pupils are equal, round, and reactive to light.  Neck: Normal range of motion. Neck supple.  Cardiovascular: Normal rate, regular rhythm and normal heart sounds.   Pulmonary/Chest:  Dry crackles bilateral bases   Abdominal: Soft. Bowel sounds are normal.  Mild diffuse lower abdominal tenderness, no rebound   Musculoskeletal: Normal range of motion. She exhibits no edema or tenderness.  Neurological: She is alert and oriented to person, place, and time.  Skin: Skin is warm and dry.  Psychiatric: She has a normal mood and affect. Her behavior is normal. Judgment  and thought content normal.  Nursing note and vitals reviewed.   ED Course  Procedures (including critical care time) Labs Review Labs Reviewed  BASIC METABOLIC PANEL - Abnormal; Notable for the following:    Potassium 3.0 (*)    Glucose, Bld 110 (*)    All other components within normal limits  CBC - Abnormal; Notable for the following:    WBC 13.3 (*)    Hemoglobin 11.6 (*)    HCT 35.9 (*)    Platelets 466 (*)    All other components within normal limits  LIPASE, BLOOD - Abnormal; Notable for the following:    Lipase 54 (*)    All other components within normal limits  URINALYSIS, ROUTINE W REFLEX MICROSCOPIC (NOT AT [facility]) - Abnormal; Notable for the following:  Leukocytes, UA SMALL (*)    All other components within normal limits  HEPATIC FUNCTION PANEL - Abnormal; Notable for the following:    Albumin 2.7 (*)    ALT 8 (*)    Bilirubin, Direct <0.1 (*)    All other components within normal limits  URINE MICROSCOPIC-ADD ON - Abnormal; Notable for the following:    Squamous Epithelial / LPF 0-5 (*)    Bacteria, UA MANY (*)    Casts HYALINE CASTS (*)    All other components within normal limits  C DIFFICILE QUICK SCREEN W PCR REFLEX  I-STAT TROPOININ, ED    Imaging Review Dg Chest 2 View  12/12/2015  CLINICAL DATA:  Chest pain and shortness of breath EXAM: CHEST  2 VIEW COMPARISON:  May 26, 2015 FINDINGS: There is fibrotic change throughout the lungs bilaterally, most pronounced in the right mid lung and both lung base regions. There is a calcified granuloma in the left base. There is no appreciable edema or consolidation. Heart size and pulmonary vascularity are normal. No adenopathy. No bone lesions. IMPRESSION: Fibrotic change throughout the lungs bilaterally, primarily noted in the bases and to a slightly lesser extent in the right mid lung region. No frank edema or consolidation. There is a calcified left lower lobe granuloma. Stable cardiac silhouette.  Electronically Signed   By: Bretta BangWilliam  Woodruff III M.D.   On: 12/12/2015 14:51   Ct Abdomen Pelvis W Contrast  12/12/2015  CLINICAL DATA:  Chronic diarrhea EXAM: CT ABDOMEN AND PELVIS WITH CONTRAST TECHNIQUE: Multidetector CT imaging of the abdomen and pelvis was performed using the standard protocol following bolus administration of intravenous contrast. CONTRAST:  100mL ISOVUE-300 IOPAMIDOL (ISOVUE-300) INJECTION 61% COMPARISON:  10/09/2008. FINDINGS: Lower chest: Changes of prior granulomatous disease are noted. Some changes of mild bronchiectasis are seen. No focal confluent infiltrate or sizable effusion is noted. A noncalcified nodular appearing density is noted in the lateral costophrenic angle on the right best seen on image number 9 of series 205. This likely represents a noncalcified granuloma measuring 8 mm. In retrospect it was present on a prior exam from 10/09/2008. Hepatobiliary: No focal mass lesion is seen. Prominence of the common bile duct is noted consistent with the patient's age. Pancreas: No mass, inflammatory changes, or other significant abnormality. Spleen: Multiple splenic granulomas are noted. Adrenals/Urinary Tract: Right renal cyst is noted measuring 2 cm. No calculi or obstructive changes are seen. Stomach/Bowel: No evidence of obstruction, inflammatory process, or abnormal fluid collections. The appendix is not well visualized although no inflammatory changes are noted. Vascular/Lymphatic: Aortoiliac calcifications are seen without aneurysmal dilatation. Reproductive: The uterus has been surgically removed. Other: None. Musculoskeletal:  No suspicious bone lesions identified. IMPRESSION: Changes of prior granulomatous disease. No focal acute abnormality is noted. Electronically Signed   By: Alcide CleverMark  Lukens M.D.   On: 12/12/2015 16:10   I have personally reviewed and evaluated these images and lab results as part of my medical decision-making.   EKG Interpretation   Date/Time:   Thursday December 12 2015 14:24:46 EDT Ventricular Rate:  73 PR Interval:  138 QRS Duration: 84 QT Interval:  426 QTC Calculation: 469 R Axis:   -28 Text Interpretation:  Normal sinus rhythm Nonspecific ST abnormality  Abnormal ECG No significant change since last tracing Confirmed by YAO   MD, DAVID ([id number]) on 12/12/2015 3:15:44 PM      MDM   Final diagnoses:  None   Florian BuffSandy C Nasby is a 75 y.o. female here  with abdominal pain, diarrhea, cough that is chronic. Has seen PCP and tried flagyl and imodium with minimal relief. Will get labs, lipase, CT ab/pel, C diff for stool, CXR. Will hydrate and reassess.   7:29 PM CT showed granulomatous disease. CXR showed granulomatous disease and no pneumonia. WBC 13. UA showed some bacteria but neg leuks and nitrates and she is asymptomatic. Held abx especially since she has hx of C diff. Had a watery bowel movement and able to send stool to lab for C diff but patient doesn't want to wait for result. Vitals stable. Has seen Dr. Eloise Harman from GI and will refer back to GI for further workup for granulomatous disease and treatment for chronic diarrhea.    Richardean Canal, MD 12/12/15 [phone]

## 2015-12-12 NOTE — ED Notes (Signed)
Patient arrived in room.  

## 2015-12-12 NOTE — Discharge Instructions (Signed)
Continue imodium.   Call Dr. Jarold Motto ([facility]) office for follow up   You have granulomas in your chest and abdomen and may need further workup   See your primary care doctor   Your potassium is slightly low, repeat potassium in a week with your doctor.   Return to ER if you have dehydration, vomiting, fever, worse diarrhea.    Food Choices to Help Relieve Diarrhea, Adult When you have diarrhea, the foods you eat and your eating habits are very important. Choosing the right foods and drinks can help relieve diarrhea. Also, because diarrhea can last up to 7 days, you need to replace lost fluids and electrolytes (such as sodium, potassium, and chloride) in order to help prevent dehydration.  WHAT GENERAL GUIDELINES DO I NEED TO FOLLOW?  Slowly drink 1 cup (8 oz) of fluid for each episode of diarrhea. If you are getting enough fluid, your urine will be clear or pale yellow.  Eat starchy foods. Some good choices include white rice, white toast, pasta, low-fiber cereal, baked potatoes (without the skin), saltine crackers, and bagels.  Avoid large servings of any cooked vegetables.  Limit fruit to two servings per day. A serving is  cup or 1 small piece.  Choose foods with less than 2 g of fiber per serving.  Limit fats to less than 8 tsp (38 g) per day.  Avoid fried foods.  Eat foods that have probiotics in them. Probiotics can be found in certain dairy products.  Avoid foods and beverages that may increase the speed at which food moves through the stomach and intestines (gastrointestinal tract). Things to avoid include:  High-fiber foods, such as dried fruit, raw fruits and vegetables, nuts, seeds, and whole grain foods.  Spicy foods and high-fat foods.  Foods and beverages sweetened with high-fructose corn syrup, honey, or sugar alcohols such as xylitol, sorbitol, and mannitol. WHAT FOODS ARE RECOMMENDED? Grains White rice. White, [place], or pita breads (fresh or  toasted), including plain rolls, buns, or bagels. White pasta. Saltine, soda, or graham crackers. Pretzels. Low-fiber cereal. Cooked cereals made with water (such as cornmeal, farina, or cream cereals). Plain muffins. Matzo. Melba toast. Zwieback.  Vegetables Potatoes (without the skin). Strained tomato and vegetable juices. Most well-cooked and canned vegetables without seeds. Tender lettuce. Fruits Cooked or canned applesauce, apricots, cherries, fruit cocktail, grapefruit, peaches, pears, or plums. Fresh bananas, apples without skin, cherries, grapes, cantaloupe, grapefruit, peaches, oranges, or plums.  Meat and Other Protein Products Baked or boiled chicken. Eggs. Tofu. Fish. Seafood. Smooth peanut butter. Ground or well-cooked tender beef, ham, veal, lamb, pork, or poultry.  Dairy Plain yogurt, kefir, and unsweetened liquid yogurt. Lactose-free milk, buttermilk, or soy milk. Plain hard cheese. Beverages Sport drinks. Clear broths. Diluted fruit juices (except prune). Regular, caffeine-free sodas such as ginger ale. Water. Decaffeinated teas. Oral rehydration solutions. Sugar-free beverages not sweetened with sugar alcohols. Other Bouillon, broth, or soups made from recommended foods.  The items listed above may not be a complete list of recommended foods or beverages. Contact your dietitian for more options. WHAT FOODS ARE NOT RECOMMENDED? Grains Whole grain, whole wheat, bran, or rye breads, rolls, pastas, crackers, and cereals. Wild or brown rice. Cereals that contain more than 2 g of fiber per serving. Corn tortillas or taco shells. Cooked or dry oatmeal. Granola. Popcorn. Vegetables Raw vegetables. Cabbage, broccoli, Brussels sprouts, artichokes, baked beans, beet greens, corn, kale, legumes, peas, sweet potatoes, and yams. Potato skins. Cooked spinach and cabbage. Fruits  Dried fruit, including raisins and dates. Raw fruits. Stewed or dried prunes. Fresh apples with skin, apricots,  mangoes, pears, raspberries, and strawberries.  Meat and Other Protein Products Chunky peanut butter. Nuts and seeds. Beans and lentils. Tomasa Blase.  Dairy High-fat cheeses. Milk, chocolate milk, and beverages made with milk, such as milk shakes. Cream. Ice cream. Sweets and Desserts Sweet rolls, doughnuts, and sweet breads. Pancakes and waffles. Fats and Oils Butter. Cream sauces. Margarine. Salad oils. Plain salad dressings. Olives. Avocados.  Beverages Caffeinated beverages (such as coffee, tea, soda, or energy drinks). Alcoholic beverages. Fruit juices with pulp. Prune juice. Soft drinks sweetened with high-fructose corn syrup or sugar alcohols. Other Coconut. Hot sauce. Chili powder. Mayonnaise. Gravy. Cream-based or milk-based soups.  The items listed above may not be a complete list of foods and beverages to avoid. Contact your dietitian for more information. WHAT SHOULD I DO IF I BECOME DEHYDRATED? Diarrhea can sometimes lead to dehydration. Signs of dehydration include dark urine and dry mouth and skin. If you think you are dehydrated, you should rehydrate with an oral rehydration solution. These solutions can be purchased at pharmacies, retail stores, or online.  Drink -1 cup (120-240 mL) of oral rehydration solution each time you have an episode of diarrhea. If drinking this amount makes your diarrhea worse, try drinking smaller amounts more often. For example, drink 1-3 tsp (5-15 mL) every 5-10 minutes.  A general rule for staying hydrated is to drink 1-2 L of fluid per day. Talk to your health care provider about the specific amount you should be drinking each day. Drink enough fluids to keep your urine clear or pale yellow.   This information is not intended to replace advice given to you by your health care provider. Make sure you discuss any questions you have with your health care provider.   Document Released: 08/15/2003 Document Revised: 06/15/2014 Document Reviewed:  04/17/2013 Elsevier Interactive Patient Education 2016 Elsevier Inc.  Potassium Content of Foods Potassium is a mineral found in many foods and drinks. It helps keep fluids and minerals balanced in your body and affects how steadily your heart beats. Potassium also helps control your blood pressure and keep your muscles and nervous system healthy. Certain health conditions and medicines may change the balance of potassium in your body. When this happens, you can help balance your level of potassium through the foods that you do or do not eat. Your health care provider or dietitian may recommend an amount of potassium that you should have each day. The following lists of foods provide the amount of potassium (in parentheses) per serving in each item. HIGH IN POTASSIUM  The following foods and beverages have 200 mg or more of potassium per serving:  Apricots, 2 raw or 5 dry (200 mg).  Artichoke, 1 medium (345 mg).  Avocado, raw,  each (245 mg).  Banana, 1 medium (425 mg).  Beans, lima, or baked beans, canned,  cup (280 mg).  Beans, white, canned,  cup (595 mg).  Beef roast, 3 oz (320 mg).  Beef, ground, 3 oz (270 mg).  Beets, raw or cooked,  cup (260 mg).  Bran muffin, 2 oz (300 mg).  Broccoli,  cup (230 mg).  Brussels sprouts,  cup (250 mg).  Cantaloupe,  cup (215 mg).  Cereal, 100% bran,  cup (200-400 mg).  Cheeseburger, single, fast food, 1 each (225-400 mg).  Chicken, 3 oz (220 mg).  Clams, canned, 3 oz (535 mg).  Crab, 3 oz (225  mg).  Dates, 5 each (270 mg).  Dried beans and peas,  cup (300-475 mg).  Figs, dried, 2 each (260 mg).  Fish: halibut, tuna, cod, snapper, 3 oz (480 mg).  Fish: salmon, haddock, swordfish, perch, 3 oz (300 mg).  Fish, tuna, canned 3 oz (200 mg).  Jama[place] fries, fast food, 3 oz (470 mg).  Granola with fruit and nuts,  cup (200 mg).  Grapefruit juice,  cup (200 mg).  Greens, beet,  cup (655 mg).  Honeydew melon,   cup (200 mg).  Kale, raw, 1 cup (300 mg).  Kiwi, 1 medium (240 mg).  Kohlrabi, rutabaga, parsnips,  cup (280 mg).  Lentils,  cup (365 mg).  Mango, 1 each (325 mg).  Milk, chocolate, 1 cup (420 mg).  Milk: nonfat, low-fat, whole, buttermilk, 1 cup (350-380 mg).  Molasses, 1 Tbsp (295 mg).  Mushrooms,  cup (280) mg.  Nectarine, 1 each (275 mg).  Nuts: almonds, peanuts, hazelnuts, [place], cashew, mixed, 1 oz (200 mg).  Nuts, pistachios, 1 oz (295 mg).  Orange, 1 each (240 mg).  Orange juice,  cup (235 mg).  Papaya, medium,  fruit (390 mg).  Peanut butter, chunky, 2 Tbsp (240 mg).  Peanut butter, smooth, 2 Tbsp (210 mg).  Pear, 1 medium (200 mg).  Pomegranate, 1 whole (400 mg).  Pomegranate juice,  cup (215 mg).  Pork, 3 oz (350 mg).  Potato chips, salted, 1 oz (465 mg).  Potato, baked with skin, 1 medium (925 mg).  Potatoes, boiled,  cup (255 mg).  Potatoes, mashed,  cup (330 mg).  Prune juice,  cup (370 mg).  Prunes, 5 each (305 mg).  Pudding, chocolate,  cup (230 mg).  Pumpkin, canned,  cup (250 mg).  Raisins, seedless,  cup (270 mg).  Seeds, sunflower or pumpkin, 1 oz (240 mg).  Soy milk, 1 cup (300 mg).  Spinach,  cup (420 mg).  Spinach, canned,  cup (370 mg).  Sweet potato, baked with skin, 1 medium (450 mg).  Swiss chard,  cup (480 mg).  Tomato or vegetable juice,  cup (275 mg).  Tomato sauce or puree,  cup (400-550 mg).  Tomato, raw, 1 medium (290 mg).  Tomatoes, canned,  cup (200-300 mg).  Mal[place]key, 3 oz (250 mg).  Wheat germ, 1 oz (250 mg).  Winter squash,  cup (250 mg).  Yogurt, plain or fruited, 6 oz (260-435 mg).  Zucchini,  cup (220 mg). MODERATE IN POTASSIUM The following foods and beverages have 50-200 mg of potassium per serving:  Apple, 1 each (150 mg).  Apple juice,  cup (150 mg).  Applesauce,  cup (90 mg).  Apricot nectar,  cup (140 mg).  Asparagus, small spears,  cup or 6  spears (155 mg).  Bagel, cinnamon raisin, 1 each (130 mg).  Bagel, egg or plain, 4 in., 1 each (70 mg).  Beans, green,  cup (90 mg).  Beans, yellow,  cup (190 mg).  Beer, regular, 12 oz (100 mg).  Beets, canned,  cup (125 mg).  Blackberries,  cup (115 mg).  Blueberries,  cup (60 mg).  Bread, whole wheat, 1 slice (70 mg).  Broccoli, raw,  cup (145 mg).  Cabbage,  cup (150 mg).  Carrots, cooked or raw,  cup (180 mg).  Cauliflower, raw,  cup (150 mg).  Celery, raw,  cup (155 mg).  Cereal, bran flakes, cup (120-150 mg).  Cheese, cottage,  cup (110 mg).  Cherries, 10 each (150 mg).  Chocolate, 1 oz bar (165  mg).  Coffee, brewed 6 oz (90 mg).  Corn,  cup or 1 ear (195 mg).  Cucumbers,  cup (80 mg).  Egg, large, 1 each (60 mg).  Eggplant,  cup (60 mg).  Endive, raw, cup (80 mg).  English muffin, 1 each (65 mg).  Fish, orange roughy, 3 oz (150 mg).  Frankfurter, beef or pork, 1 each (75 mg).  Fruit cocktail,  cup (115 mg).  Grape juice,  cup (170 mg).  Grapefruit,  fruit (175 mg).  Grapes,  cup (155 mg).  Greens: kale, turnip, collard,  cup (110-150 mg).  Ice cream or frozen yogurt, chocolate,  cup (175 mg).  Ice cream or frozen yogurt, vanilla,  cup (120-150 mg).  Lemons, limes, 1 each (80 mg).  Lettuce, all types, 1 cup (100 mg).  Mixed vegetables,  cup (150 mg).  Mushrooms, raw,  cup (110 mg).  Nuts: walnuts, pecans, or macadamia, 1 oz (125 mg).  Oatmeal,  cup (80 mg).  Okra,  cup (110 mg).  Onions, raw,  cup (120 mg).  Peach, 1 each (185 mg).  Peaches, canned,  cup (120 mg).  Pears, canned,  cup (120 mg).  Peas, green, frozen,  cup (90 mg).  Peppers, green,  cup (130 mg).  Peppers, red,  cup (160 mg).  Pineapple juice,  cup (165 mg).  Pineapple, fresh or canned,  cup (100 mg).  Plums, 1 each (105 mg).  Pudding, vanilla,  cup (150 mg).  Raspberries,  cup (90 mg).  Rhubarb,   cup (115 mg).  Rice, wild,  cup (80 mg).  Shrimp, 3 oz (155 mg).  Spinach, raw, 1 cup (170 mg).  Strawberries,  cup (125 mg).  Summer squash  cup (175-200 mg).  Swiss chard, raw, 1 cup (135 mg).  Tangerines, 1 each (140 mg).  Tea, brewed, 6 oz (65 mg).  Turnips,  cup (140 mg).  Watermelon,  cup (85 mg).  Wine, red, table, 5 oz (180 mg).  Wine, white, table, 5 oz (100 mg). LOW IN POTASSIUM The following foods and beverages have less than 50 mg of potassium per serving.  Bread, white, 1 slice (30 mg).  Carbonated beverages, 12 oz (less than 5 mg).  Cheese, 1 oz (20-30 mg).  Cranberries,  cup (45 mg).  Cranberry juice cocktail,  cup (20 mg).  Fats and oils, 1 Tbsp (less than 5 mg).  Hummus, 1 Tbsp (32 mg).  Nectar: papaya, mango, or pear,  cup (35 mg).  Rice, white or brown,  cup (50 mg).  Spaghetti or macaroni,  cup cooked (30 mg).  Tortilla, flour or corn, 1 each (50 mg).  Waffle, 4 in., 1 each (50 mg).  Water chestnuts,  cup (40 mg).   This information is not intended to replace advice given to you by your health care provider. Make sure you discuss any questions you have with your health care provider.   Document Released: 01/06/2005 Document Revised: 05/30/2013 Document Reviewed: 04/21/2013 Elsevier Interactive Patient Education [employer]2016 Elsevier Inc.

## 2015-12-12 NOTE — ED Notes (Signed)
Patient transported from triage to xray.  Called xray patient currently in xray and will transport patient to B14.

## 2015-12-12 NOTE — ED Notes (Signed)
Called pharmacy for med

## 2015-12-12 NOTE — ED Notes (Signed)
MD at bedside. 

## 2015-12-12 NOTE — ED Notes (Signed)
Per PT, Pt is coming from home. Pt reports having chronic diarrhea for three months without being able to control bowel. Pt reports worsening in the last week. Complains of dehydration, productive cough, and SOB.

## 2015-12-12 NOTE — ED Notes (Signed)
Patient transported to CT 

## 2015-12-13 LAB — URINE CULTURE

## 2015-12-13 NOTE — ED Provider Notes (Signed)
Tried to follow up on C diff results today. C diff sent but test unable to be run, unclear if its from wrong specimen cup or not enough sample. Patient has been taking her left over flagyl and hasn't helped and has no colitis on CT yesterday. Referred her back to GI for follow up.   Richardean Canalavid H Genessa Beman, MD 12/13/15 1137

## 2015-12-17 ENCOUNTER — Encounter: Payer: Self-pay | Admitting: Gastroenterology

## 2015-12-25 ENCOUNTER — Other Ambulatory Visit (INDEPENDENT_AMBULATORY_CARE_PROVIDER_SITE_OTHER): Payer: Medicare PPO

## 2015-12-25 ENCOUNTER — Encounter: Payer: Self-pay | Admitting: Gastroenterology

## 2015-12-25 ENCOUNTER — Ambulatory Visit (INDEPENDENT_AMBULATORY_CARE_PROVIDER_SITE_OTHER): Payer: Medicare PPO | Admitting: Gastroenterology

## 2015-12-25 VITALS — BP 100/70 | HR 84 | Ht 63.78 in | Wt 106.5 lb

## 2015-12-25 DIAGNOSIS — R197 Diarrhea, unspecified: Secondary | ICD-10-CM

## 2015-12-25 LAB — TSH: TSH: 2.06 u[IU]/mL (ref 0.35–4.50)

## 2015-12-25 LAB — CBC WITH DIFFERENTIAL/PLATELET
BASOS ABS: 0 10*3/uL (ref 0.0–0.1)
BASOS PCT: 0.4 % (ref 0.0–3.0)
EOS ABS: 0.5 10*3/uL (ref 0.0–0.7)
Eosinophils Relative: 4.4 % (ref 0.0–5.0)
HCT: 35.9 % — ABNORMAL LOW (ref 36.0–46.0)
Hemoglobin: 11.9 g/dL — ABNORMAL LOW (ref 12.0–15.0)
Lymphocytes Relative: 22.7 % (ref 12.0–46.0)
Lymphs Abs: 2.5 10*3/uL (ref 0.7–4.0)
MCHC: 33.1 g/dL (ref 30.0–36.0)
MCV: 88.9 fl (ref 78.0–100.0)
MONO ABS: 0.7 10*3/uL (ref 0.1–1.0)
Monocytes Relative: 6.4 % (ref 3.0–12.0)
NEUTROS ABS: 7.2 10*3/uL (ref 1.4–7.7)
NEUTROS PCT: 66.1 % (ref 43.0–77.0)
PLATELETS: 355 10*3/uL (ref 150.0–400.0)
RBC: 4.04 Mil/uL (ref 3.87–5.11)
RDW: 15.7 % — AB (ref 11.5–15.5)
WBC: 10.8 10*3/uL — ABNORMAL HIGH (ref 4.0–10.5)

## 2015-12-25 LAB — BASIC METABOLIC PANEL
BUN: 9 mg/dL (ref 6–23)
CO2: 30 mEq/L (ref 19–32)
Calcium: 9.5 mg/dL (ref 8.4–10.5)
Chloride: 103 mEq/L (ref 96–112)
Creatinine, Ser: 0.86 mg/dL (ref 0.40–1.20)
GFR: 67.92 mL/min (ref >60.00)
Glucose, Bld: 70 mg/dL (ref 70–99)
POTASSIUM: 4.3 meq/L (ref 3.5–5.1)
SODIUM: 140 meq/L (ref 135–145)

## 2015-12-25 LAB — IGA: IGA: 132 mg/dL (ref 68–378)

## 2015-12-25 MED ORDER — NA SULFATE-K SULFATE-MG SULF 17.5-3.13-1.6 GM/177ML PO SOLN: 1.0000 | Freq: Once | ORAL | Status: DC

## 2015-12-25 NOTE — Patient Instructions (Signed)
You have been scheduled for a colonoscopy. Please follow written instructions given to you at your visit today.  Please pick up your prep supplies at the pharmacy within the next 1-3 days. If you use inhalers (even only as needed), please bring them with you on the day of your procedure. Your physician has requested that you go to www.startemmi.com and enter the access code given to you at your visit today. This web site gives a general overview about your procedure. However, you should still follow specific instructions given to you by our office regarding your preparation for the procedure.  Your physician has requested that you go to the basement for lab work before leaving today.  

## 2015-12-26 ENCOUNTER — Other Ambulatory Visit: Payer: Medicare PPO

## 2015-12-26 DIAGNOSIS — R197 Diarrhea, unspecified: Secondary | ICD-10-CM

## 2015-12-26 LAB — TISSUE TRANSGLUTAMINASE, IGA: TISSUE TRANSGLUTAMINASE AB, IGA: 1 U/mL (ref <4)

## 2015-12-27 LAB — CLOSTRIDIUM DIFFICILE BY PCR: Toxigenic C. Difficile by PCR: NOT DETECTED

## 2015-12-27 LAB — FECAL LACTOFERRIN, QUANT: LACTOFERRIN: POSITIVE

## 2015-12-30 LAB — STOOL CULTURE

## 2015-12-30 LAB — OVA AND PARASITE EXAMINATION: OP: NONE SEEN

## 2016-01-01 ENCOUNTER — Encounter: Payer: Self-pay | Admitting: Gastroenterology

## 2016-01-01 NOTE — Progress Notes (Addendum)
12/25/2015 Brooke [name] [id number] [birth year]   HISTORY OF PRESENT ILLNESS:  This is a 75 year old female who was previously known to Dr. Sharlett Iles and had a colonoscopy more than 10 years ago. It is not in our current system. She presents for office today with complaints of diarrhea that has been present for the past 4-5 months. She says that she will have anywhere from 4-6 bowel movements a day. She says that it just comes right out at nighttime without much warning. She says that the stool is watery and has some gritty sediment. She says that she saw a small amount of bright red blood on occasion but otherwise does not have any bleeding associated with this. She occasionally has some cramping associated with bowel movements but resolves with moving her bowels. No persistent abdominal pains.  She had a CT scan of her abdomen and pelvis with contrast earlier this month that showed changes of prior granulomatous disease in her lungs but no other acute abnormalities or cause of her symptoms found. CBC showed a slightly elevated white blood cell count at 13,000. Potassium was low at 3.0. Hepatic function panel shows a low albumin at 2.7, but is otherwise unremarkable.  Does have bronchiectasis and is on chronic O2 for several years.  Of note, she previously was on pantoprazole so tried discontinuing that but diarrhea did not get better.   Past Medical History:  Diagnosis Date  . Bronchiectasis    colonized mrsa, s/p vancomyin IV 10 days 5/10 and 7 days 6/10  . Headache(784.0)   . History of allergy   . Hypothyroidism   . Mycobacterium avium complex ([place])   . Pneumonia    Past Surgical History:  Procedure Laterality Date  . ABDOMINAL HYSTERECTOMY    . CATARACT EXTRACTION, BILATERAL    . TONSILLECTOMY     t and a    reports that she quit smoking about 53 years ago. Her smoking use included Cigarettes. She has a 3.00 pack-year smoking history. She has never used smokeless tobacco. She  reports that she does not drink alcohol or use drugs. family history includes COPD in her maternal aunt and maternal uncle; Lung cancer in her father; Lung disease in her father, maternal aunt, and maternal uncle; Parkinson's disease in her maternal aunt; Skin cancer in her mother. Allergies  Allergen Reactions  . Ambien [Zolpidem Tartrate]     Sleep walking and altered mental status  . Doxycycline     vomiting  . Levofloxacin Other (See Comments)    Unknown rxn.  Tolerates Cipro  . Mometasone Furoate Other (See Comments)     nasonex causes swollen throat, thrush  . Penicillins Other (See Comments)    Unknown reaction noted on hospice paperwork  . Sulfonamide Derivatives Other (See Comments)    Unknown reaction noted on hospice paperwork      Outpatient Encounter Prescriptions as of 12/25/2015  Medication Sig  . albuterol (PROVENTIL) (2.5 MG/3ML) 0.083% nebulizer solution Take 3 mLs (2.5 mg total) by nebulization every 6 (six) hours as needed (respiratory distress).  Marland Kitchen albuterol (VENTOLIN HFA) 108 (90 BASE) MCG/ACT inhaler Inhale 2 puffs into the lungs every 6 (six) hours as needed.  . Aspirin-Salicylamide-Caffeine (BC HEADACHE POWDER PO) Take 1 tablet by mouth daily as needed (headache).   . citalopram (CELEXA) 40 MG tablet TAKE ONE (1) TABLET BY MOUTH EVERY DAY  . fluticasone (FLONASE) 50 MCG/ACT nasal spray USE 2 SPRAYS IN EACH NOSTRIL ONCE DAILY  .  ipratropium-albuterol (DUONEB) 0.5-2.5 (3) MG/3ML SOLN Take 3 mLs by nebulization 3 (three) times daily.  Marland Kitchen levothyroxine (SYNTHROID, LEVOTHROID) 100 MCG tablet TAKE ONE-HALF TABLET BY MOUTH EVERY MORNING BEFORE BREAKFAST  . MetroNIDAZOLE (FLAGYL PO) Take by mouth as needed.  . nystatin-triamcinolone (MYCOLOG II) cream Apply 1 application topically 2 (two) times daily as needed. To the affected area  . pantoprazole (PROTONIX) 40 MG tablet TAKE ONE (1) TABLET BY MOUTH EVERY DAY  . prochlorperazine (COMPAZINE) 10 MG tablet TAKE ONE (1)  TABLET BY MOUTH EVERY 6 HOURS AS NEEDED FOR NAUSEA  . sodium chloride HYPERTONIC 3 % nebulizer solution Use 4ML two times a day  . traZODone (DESYREL) 100 MG tablet Take 2 tablets (200 mg total) by mouth at bedtime.  . triamcinolone (KENALOG) 0.025 % cream APPLY TO AFFECTED AREA THREE TIMES DAILYAS NEEDED  . WHEY PROTEIN PO Take by mouth daily.  . Na Sulfate-K Sulfate-Mg Sulf 17.5-3.13-1.6 GM/180ML SOLN Take 1 kit by mouth once.  . [DISCONTINUED] ALPRAZolam (XANAX) 0.5 MG tablet Take 1 tablet (0.5 mg total) by mouth 2 (two) times daily as needed. (Patient not taking: Reported on 05/17/2015)  . [DISCONTINUED] HYDROcodone-homatropine (HYCODAN) 5-1.5 MG/5ML syrup Take 5 mL by mouth as needed before bed for cough  . [DISCONTINUED] loratadine (CLARITIN) 10 MG tablet Take 10 mg by mouth daily as needed for allergies (allergies).   . [DISCONTINUED] meloxicam (MOBIC) 15 MG tablet Take 1 tablet (15 mg total) by mouth daily. (Patient not taking: Reported on 05/17/2015)  . [DISCONTINUED] predniSONE (DELTASONE) 10 MG tablet Take 4 tablets (40 mg total) by mouth daily.   No facility-administered encounter medications on file as of 12/25/2015.      REVIEW OF SYSTEMS  : All other systems reviewed and negative except where noted in the History of Present Illness.   PHYSICAL EXAM: BP 100/70   Pulse 84   Ht 5' 3.78" (1.62 m) Comment: w/o shoes  Wt 106 lb 8 oz (48.3 kg)   BMI 18.41 kg/m  General: Thin white female in no acute distress Head: Normocephalic and atraumatic Eyes:  Sclerae anicteric, conjunctiva pink. Ears: Normal auditory acuity Lungs: Clear throughout to auscultation Heart: Regular rate and rhythm Abdomen: Soft, non-distended.  BS present.  Non-tender. Rectal:  Will be done at the time of colonoscopy. Musculoskeletal: Symmetrical with no gross deformities  Skin: No lesions on visible extremities Extremities: No edema  Neurological: Alert oriented x 4, grossly non-focal Psychological:   Alert and cooperative. Normal mood and affect  ASSESSMENT AND PLAN: -75 year old female with bronchiectasis on chronic O2 for several years who presents to our office with several months of diarrhea.  We will check several labs and stool studies today including CBC, BMP, celiac labs, TSH, stool culture, stool for C. difficile PCR, stool for ova and parasites, stool for lactoferrin, and fecal elastase. We will schedule her for a colonoscopy at [facility] as well pending that no other source for diarrhea is found on the labs and stool studies.  Will be looking for microscopic colitis, etc.  She is aware of her increased risk for procedure due to her chronic oxygen use. She is very agreeable to the procedure and would like to have it performed.  The risks, benefits, and alternatives to colonoscopy were discussed with the patient and she consents to proceed.  In the interim she will try some Imodium to see if that will help manage her symptoms.  CC:  No ref. provider found  Addendum: Reviewed  and agree with initial management. Jerene Bears, MD

## 2016-01-02 ENCOUNTER — Other Ambulatory Visit: Payer: Self-pay | Admitting: *Deleted

## 2016-01-02 LAB — PANCREATIC ELASTASE, FECAL: PANCREATIC ELASTASE-1, STL: 71 ug/g — AB

## 2016-01-02 MED ORDER — PANCRELIPASE (LIP-PROT-AMYL) 40000-136000 UNITS PO CPEP: ORAL_CAPSULE | ORAL | 3 refills | Status: DC

## 2016-01-03 ENCOUNTER — Telehealth: Payer: Self-pay | Admitting: Internal Medicine

## 2016-01-03 NOTE — Telephone Encounter (Signed)
Spoke with patient and she cannot afford the Zenpep. Will have Brooke [name] look into option for patient. Patient aware and understands it will be Tuesday when she hears back.

## 2016-01-10 ENCOUNTER — Telehealth: Payer: Self-pay | Admitting: Gastroenterology

## 2016-01-13 NOTE — Telephone Encounter (Signed)
Will [facility] to help her apply [facility]

## 2016-01-13 NOTE — Telephone Encounter (Signed)
Patient states that her insurance will not cover any of these medications and is wanting to know if there is anything less expensive. Best # [phone]

## 2016-01-13 NOTE — Telephone Encounter (Signed)
Called the patient to advise that unfortunately  This medication is expensive and the Creon medication is also the same price.  I did give her the customer care advocate number for Zenpep , [phone]. I advised her to call and ask about the patient assistance program. She thanked me for the information.

## 2016-01-13 NOTE — Telephone Encounter (Signed)
Patient calling in regarding this. Best # [phone]

## 2016-01-14 NOTE — Telephone Encounter (Signed)
[facility], I told him the patient cannot afford the Zenpep 40 000 units.  He said he can get the Creon 36000 units. She can take the same, 8 pills daily with meals and snacks. # 240 with 3 refills. The copay is $ 47.00 a month.  Tried to call the patient got no answer.

## 2016-01-14 NOTE — Telephone Encounter (Signed)
Called the patient, and advised we were able to get her Creon 36000 units. She can take  2 tabs with each meal and 1 tab with 1-2 snacks a day.  She loved the price of her co-pay of $47.00.  [facility] thanked [facility]

## 2016-01-14 NOTE — Telephone Encounter (Signed)
Called patient on home number, got no answer, no answering device.

## 2016-01-20 ENCOUNTER — Telehealth: Payer: Self-pay | Admitting: Pulmonary Disease

## 2016-01-20 ENCOUNTER — Telehealth: Payer: Self-pay | Admitting: Gastroenterology

## 2016-01-20 NOTE — Telephone Encounter (Signed)
Reviewed chart. Noted issues. Pls give 30 min slot first available. I will get sign out from Dr Jamison NeighborNestor for transition of care.   Thanks  Dr. Kalman ShanMurali Kaneisha Ellenberger, M.D., [facility].C.P Pulmonary and Critical Care Medicine Staff Physician [facility] Pager: [phone], If no answer or between  15:00h - 7:00h: call [phone]  01/20/2016 5:51 PM

## 2016-01-20 NOTE — Telephone Encounter (Signed)
Please advise MR thanks 

## 2016-01-20 NOTE — Telephone Encounter (Signed)
Called spoke with pt. She is requesting to change from Dr. Jamison NeighborNestor to Dr. Marchelle Gearingamaswamy. Pt reports when she did not like Dr. Crista CurbNestor's attitude toward her the day of her visit. Pt aware must get the okay from both doctor's to switch. Please advise Dr. Jamison NeighborNestor thanks

## 2016-01-20 NOTE — Telephone Encounter (Signed)
Fine with me as long as MR is ok with it.

## 2016-01-21 NOTE — Telephone Encounter (Signed)
Attempted to return call no answer/machine

## 2016-01-21 NOTE — Telephone Encounter (Signed)
Spoke with pt. Did not want to schedule appointment at this time. Stated, "I am half asleep. Can I call you back?" Will await pt's call back.

## 2016-01-22 ENCOUNTER — Telehealth: Payer: Self-pay | Admitting: Internal Medicine

## 2016-01-22 NOTE — Telephone Encounter (Signed)
Attempted to call again.  No answer/machine.

## 2016-01-22 NOTE — Telephone Encounter (Signed)
lmtcb X1 for pt  

## 2016-01-22 NOTE — Telephone Encounter (Signed)
Patient calling to get acute appointment.  Patient coughing up yellow, green mucus.  Chest congestion, fever.  Patient scheduled to see TP tomorrow at 2:45pm.  Patient advised that if she gets worse through the night to go to ER.  Nothing further needed.

## 2016-01-23 ENCOUNTER — Ambulatory Visit (INDEPENDENT_AMBULATORY_CARE_PROVIDER_SITE_OTHER): Payer: Medicare PPO | Admitting: Adult Health

## 2016-01-23 ENCOUNTER — Encounter: Payer: Self-pay | Admitting: *Deleted

## 2016-01-23 ENCOUNTER — Telehealth: Payer: Self-pay | Admitting: Adult Health

## 2016-01-23 ENCOUNTER — Encounter: Payer: Self-pay | Admitting: Adult Health

## 2016-01-23 VITALS — BP 102/62 | HR 102 | Temp 98.3°F | Ht 63.5 in | Wt 105.2 lb

## 2016-01-23 DIAGNOSIS — J471 Bronchiectasis with (acute) exacerbation: Secondary | ICD-10-CM

## 2016-01-23 MED ORDER — FLUTTER DEVI: 0 refills | Status: DC

## 2016-01-23 MED ORDER — PREDNISONE 10 MG PO TABS: ORAL_TABLET | ORAL | 0 refills | Status: DC

## 2016-01-23 MED ORDER — CIPROFLOXACIN HCL 500 MG PO TABS: 500.0000 mg | ORAL_TABLET | Freq: Two times a day (BID) | ORAL | 0 refills | Status: AC

## 2016-01-23 NOTE — Assessment & Plan Note (Signed)
Flare  Check sputum cx and AFB  Add flutter valve back in .  xopenenex neb x 1 in office   Plan  Patient Instructions  Take Cipro 500mg  Twice daily  For 10 days .  Predisone taper over next week.  Sputum culture today  Add Mucinex Twice daily  For cough/congestion  Add Flutter valve .Three times a day   Continue on Albuterol Neb Three times a day  .  Follow  up with Dr. Marchelle Gearingamaswamy in 6-8 weeks and As needed

## 2016-01-23 NOTE — Telephone Encounter (Signed)
Spoke with patient and answered her questions. She states she is doing better with diarrhea since starting this. She states she has been nervous since starting medication. States it is not like her.

## 2016-01-23 NOTE — Patient Instructions (Addendum)
Take Cipro 500mg  Twice daily  For 10 days .  Predisone taper over next week.  Sputum culture today  Add Mucinex Twice daily  For cough/congestion  Add Flutter valve .Three times a day   Continue on Albuterol Neb Three times a day  .  Follow  up with Dr. Marchelle Gearingamaswamy in 6-8 weeks and As needed

## 2016-01-23 NOTE — Addendum Note (Signed)
Addended by: Karalee HeightOX, Lella Mullany P on: 01/23/2016 04:34 PM   Modules accepted: Orders

## 2016-01-23 NOTE — Progress Notes (Signed)
Subjective:    Patient ID: Brooke [name], female    DOB: [birth year], 75 y.o.   MRN: [id number]  HPI 75 yo female  followed for Bronchiectasis , Chronic hypoxic respiratory failure  Chronic pian on narcotics.   01/23/2016 Acute OV  Pt presents for an acute office visit. Complains of 1 week of cough, congestion , thick mucus, green .  Has been more sob . More wheezing on/off for last week.  Lost flutter valve. , has not used in a while.  Never had a VEST .  On Duoneb Three times a day but says she is not taking Says she is taking Albuterol 2-3 times a day  Started on Hypertonic neb solution , but did not like this.  On Oxycodone Three times a day  .   CXR 12/2015 showed fibrotic changes bilaterally in lungs.  Sputum cx in 09/2014 with psuedomonas.   Pt did request oxycodone refill and hydromet refill Explained that pain meds will not be able to be filled with our office . Will need to come thru PCP .  Also will not rx hydromet as it is also a narcotic and increases risk of sedation.  She verbalized understanding.    Past Medical History:  Diagnosis Date  . Bronchiectasis    colonized mrsa, s/p vancomyin IV 10 days 5/10 and 7 days 6/10  . Headache(784.0)   . History of allergy   . Hypothyroidism   . Mycobacterium avium complex ([place])   . Pneumonia    Current Outpatient Prescriptions on File Prior to Visit  Medication Sig Dispense Refill  . albuterol (PROVENTIL) (2.5 MG/3ML) 0.083% nebulizer solution Take 3 mLs (2.5 mg total) by nebulization every 6 (six) hours as needed (respiratory distress). 360 mL 3  . albuterol (VENTOLIN HFA) 108 (90 BASE) MCG/ACT inhaler Inhale 2 puffs into the lungs every 6 (six) hours as needed. 1 Inhaler 3  . Aspirin-Salicylamide-Caffeine (BC HEADACHE POWDER PO) Take 1 tablet by mouth daily as needed (headache).     . citalopram (CELEXA) 40 MG tablet TAKE ONE (1) TABLET BY MOUTH EVERY DAY 30 tablet 2  . levothyroxine (SYNTHROID, LEVOTHROID) 100 MCG tablet  TAKE ONE-HALF TABLET BY MOUTH EVERY MORNING BEFORE BREAKFAST 15 tablet 0  . Pancrelipase, Lip-Prot-Amyl, (ZENPEP) 40000 units CPEP Take 2 pills with meals(breakfast, lunch and dinner) and take 1 pill with snacks. 240 capsule 3  . pantoprazole (PROTONIX) 40 MG tablet TAKE ONE (1) TABLET BY MOUTH EVERY DAY 30 tablet 4  . sodium chloride HYPERTONIC 3 % nebulizer solution Use 4ML two times a day 240 mL 3  . traZODone (DESYREL) 100 MG tablet Take 2 tablets (200 mg total) by mouth at bedtime. 60 tablet 3  . triamcinolone (KENALOG) 0.025 % cream APPLY TO AFFECTED AREA THREE TIMES DAILYAS NEEDED 30 g 0  . fluticasone (FLONASE) 50 MCG/ACT nasal spray USE 2 SPRAYS IN EACH NOSTRIL ONCE DAILY (Patient not taking: Reported on 01/23/2016) 16 g 5  . ipratropium-albuterol (DUONEB) 0.5-2.5 (3) MG/3ML SOLN Take 3 mLs by nebulization 3 (three) times daily. (Patient not taking: Reported on 01/23/2016) 270 mL 3  . MetroNIDAZOLE (FLAGYL PO) Take by mouth as needed.    . Na Sulfate-K Sulfate-Mg Sulf 17.5-3.13-1.6 GM/180ML SOLN Take 1 kit by mouth once. (Patient not taking: Reported on 01/23/2016) 354 mL 0  . nystatin-triamcinolone (MYCOLOG II) cream Apply 1 application topically 2 (two) times daily as needed. To the affected area (Patient not taking: Reported on 01/23/2016) 30 g  6  . prochlorperazine (COMPAZINE) 10 MG tablet TAKE ONE (1) TABLET BY MOUTH EVERY 6 HOURS AS NEEDED FOR NAUSEA (Patient not taking: Reported on 01/23/2016) 30 tablet 3  . WHEY PROTEIN PO Take by mouth daily.     No current facility-administered medications on file prior to visit.      Review of Systems Constitutional:   No  weight loss, night sweats,  Fevers, chills, fatigue, or  lassitude.  HEENT:   No headaches,  Difficulty swallowing,  Tooth/dental problems, or  Sore throat,                No sneezing, itching, ear ache, nasal congestion, post nasal drip,   CV:  No chest pain,  Orthopnea, PND, swelling in lower extremities, anasarca,  dizziness, palpitations, syncope.   GI  No heartburn, indigestion, abdominal pain, nausea, vomiting, diarrhea, change in bowel habits, loss of appetite, bloody stools.   Resp: No shortness of breath with exertion or at rest.  No excess mucus, no productive cough,  No non-productive cough,  No coughing up of blood.  No change in color of mucus.  No wheezing.  No chest wall deformity  Skin: no rash or lesions.  GU: no dysuria, change in color of urine, no urgency or frequency.  No flank pain, no hematuria   MS:  No joint pain or swelling.  No decreased range of motion.  No back pain.  Psych:  No change in mood or affect. No depression or anxiety.  No memory loss.         Objective:   Physical Exam Vitals:   01/23/16 1428  BP: 102/62  Pulse: (!) 102  Temp: 98.3 F (36.8 C)  TempSrc: Oral  SpO2: 95%  Weight: 105 lb 3.2 oz (47.7 kg)  Height: 5' 3.5" (1.613 m)   GEN: A/Ox3; pleasant , NAD, thin and frail    HEENT:  [place]/AT,  EACs-clear, TMs-wnl, NOSE-clear, THROAT-clear, no lesions, no postnasal drip or exudate noted.   NECK:  Supple w/ fair ROM; no JVD; normal carotid impulses w/o bruits; no thyromegaly or nodules palpated; no lymphadenopathy.    RESP  Decreased BS in bases ,  no accessory muscle use, no dullness to percussion  CARD:  RRR, no m/r/g  , no peripheral edema, pulses intact, no cyanosis or clubbing.  GI:   Soft & nt; nml bowel sounds; no organomegaly or masses detected.   Musco: Warm bil, no deformities or joint swelling noted.   Neuro: alert, no focal deficits noted.    Skin: Warm, no lesions or rashes  Pami Wool NP-C  [facility]  01/23/2016        Assessment & Plan:

## 2016-01-23 NOTE — Telephone Encounter (Signed)
Called and spoke to pharmacy tech and was advised the pt was expecting a pred taper, advised pharmacy tech that per TP's OV note that a pred taper was suppose to be sent and informed her that the taper will be sent now. Pt told pharmacy she would be back tomorrow to pick up pred. Brooke SheldonAmanda Cox sent TP's pred taper. Nothing further needed at this time.

## 2016-01-23 NOTE — Addendum Note (Signed)
Addended by: Karalee HeightOX, Jaidev Sanger P on: 01/23/2016 04:39 PM   Modules accepted: Orders

## 2016-01-27 NOTE — Telephone Encounter (Signed)
Occasional medication side effects can sometimes be irritability.  If the medication is helping her and she is able to tolerate the side effects then I would like her to continue them if possible.  Thank you,  Jess

## 2016-01-27 NOTE — Telephone Encounter (Signed)
Patient notified

## 2016-01-27 NOTE — Telephone Encounter (Signed)
lmtcb x2 for pt. 

## 2016-01-28 NOTE — Telephone Encounter (Signed)
Called and spoke with pt and she stated that she just woke up and that she could not schedule the appt at this time.  Pt stated that she will call back and schedule the appt with MR.

## 2016-01-31 ENCOUNTER — Telehealth: Payer: Self-pay | Admitting: Adult Health

## 2016-01-31 NOTE — Telephone Encounter (Signed)
Nothing else to offer over the phone - if can't get comfortable at rest even after the albuterol will need to go to ER

## 2016-01-31 NOTE — Telephone Encounter (Signed)
Called spoke with pt. Reviewed MW's recs. She voiced understanding and had no further questions. Nothing further needed.

## 2016-01-31 NOTE — Telephone Encounter (Signed)
Called and spoke with pt and she stated that she is not able to do anything.  She stated that they are moving and she is not able to walk 2 feet without coughing and peeing on herself.  Pt stated that she is almost done with the cipro and is still taking the prednisone.  She stated that the cough is non productive and her chest is tight and heavy.  Pt stated that she is coughing constantly and nothing helps with this.  MR is out of the office this afternoon.  MW please advise. Thanks.  Allergies  Allergen Reactions  . Ambien [Zolpidem Tartrate]     Sleep walking and altered mental status  . Doxycycline     vomiting  . Levofloxacin Other (See Comments)    Unknown rxn.  Tolerates Cipro  . Mometasone Furoate Other (See Comments)     nasonex causes swollen throat, thrush  . Penicillins Other (See Comments)    Unknown reaction noted on hospice paperwork  . Sulfonamide Derivatives Other (See Comments)    Unknown reaction noted on hospice paperwork

## 2016-02-03 ENCOUNTER — Telehealth: Payer: Self-pay

## 2016-02-03 NOTE — Telephone Encounter (Signed)
Pt states that the medication she has been taking has stopped her issues with diarrhea. Pt reports that she wants to cancel her colon that is [facility], reports she is also moving that day.. Colon cancelled and Dr. Rhea BeltonPyrtle aware.

## 2016-02-07 ENCOUNTER — Ambulatory Visit (HOSPITAL_COMMUNITY): Admit: 2016-02-07 | Payer: Medicare PPO | Admitting: Internal Medicine

## 2016-02-07 SURGERY — COLONOSCOPY WITH PROPOFOL
Anesthesia: Monitor Anesthesia Care

## 2016-02-17 ENCOUNTER — Ambulatory Visit: Payer: Medicare PPO | Admitting: Gastroenterology

## 2016-02-22 IMAGING — CR DG CHEST 2V
2 series · 2 of 2 positions shown · non-contrast
Comparison: 07/05/2013

CLINICAL DATA: Weakness. Cough. History of "Pulmonary disease due
to mycobacteria"

EXAM:
CHEST  2 VIEW

[w chest lat]
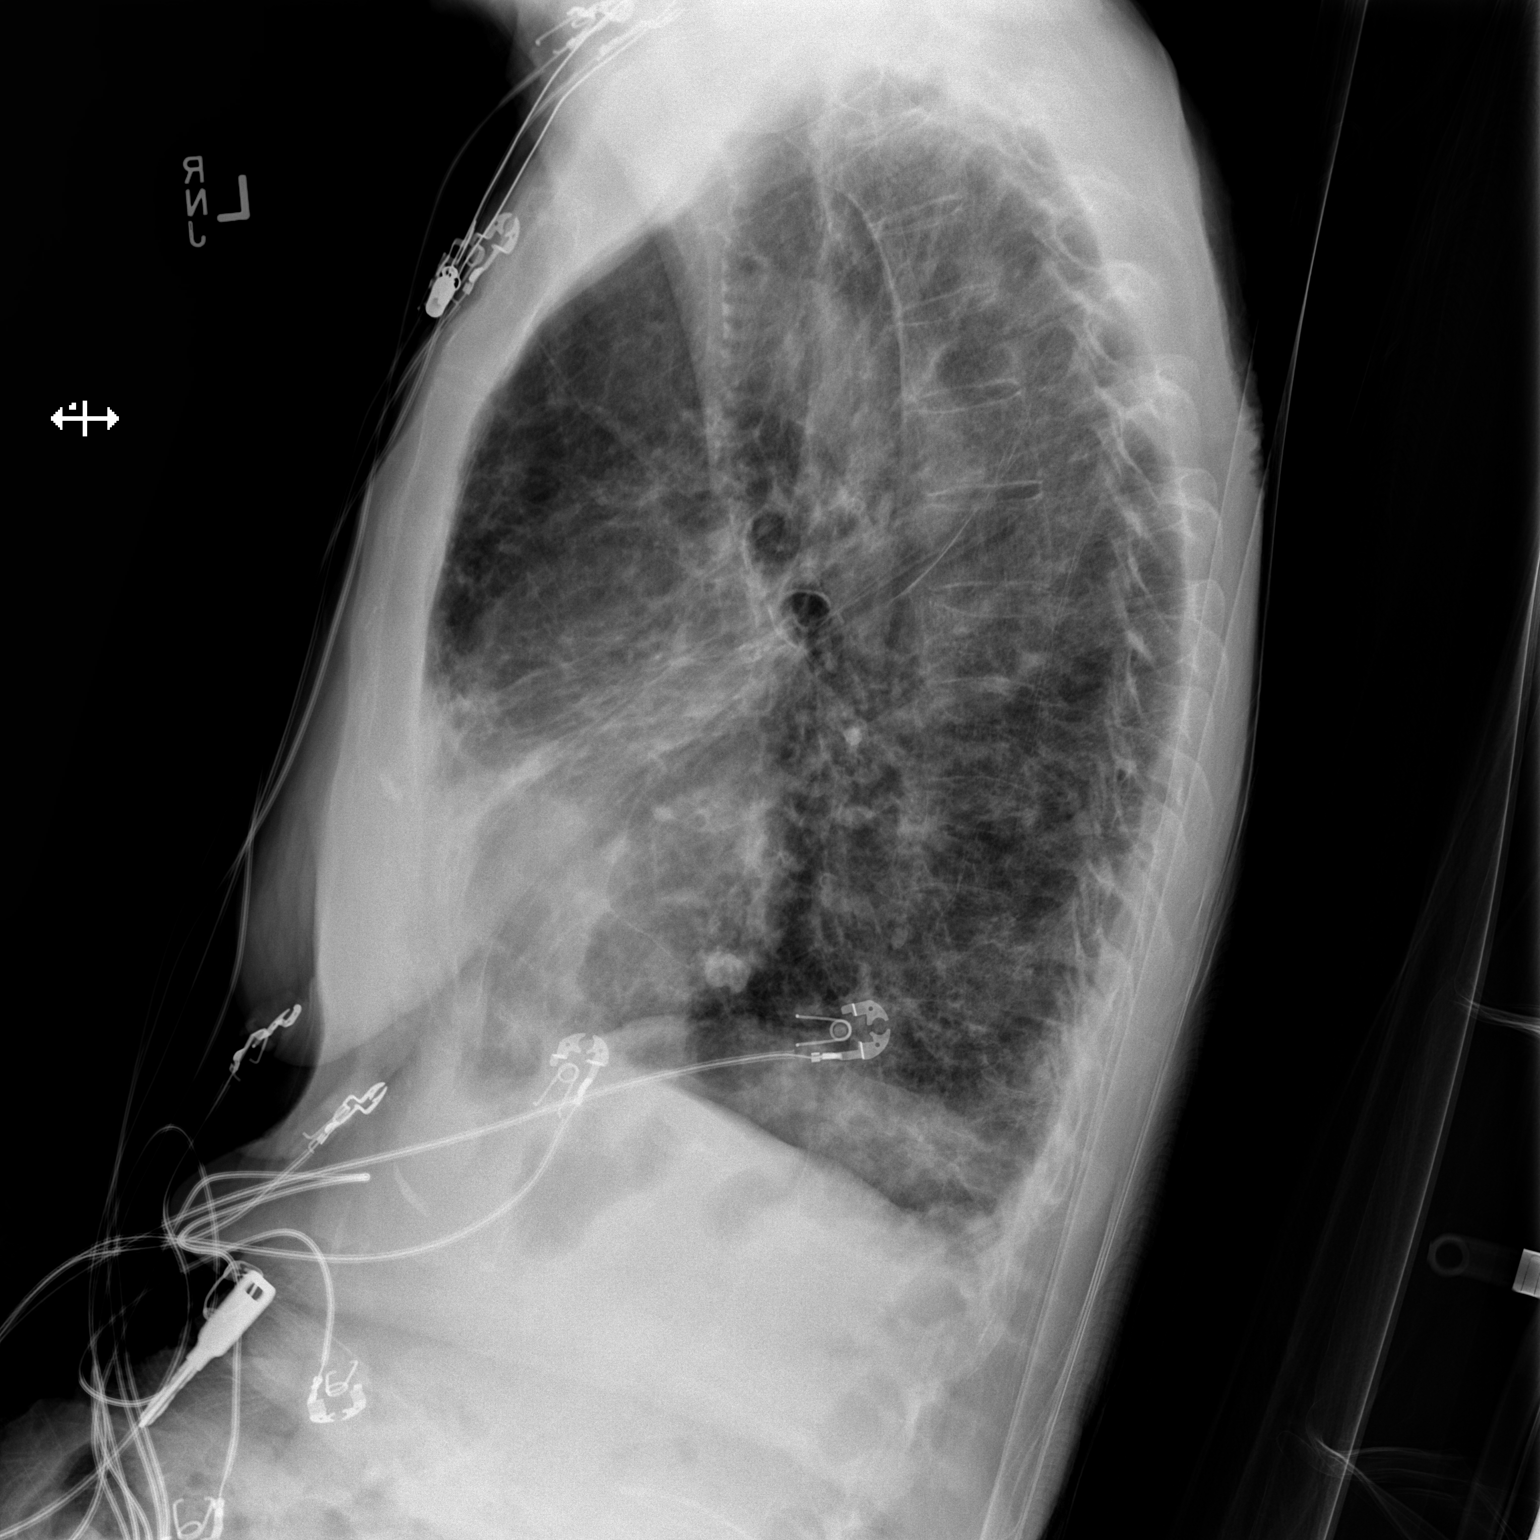

[x chest ap]
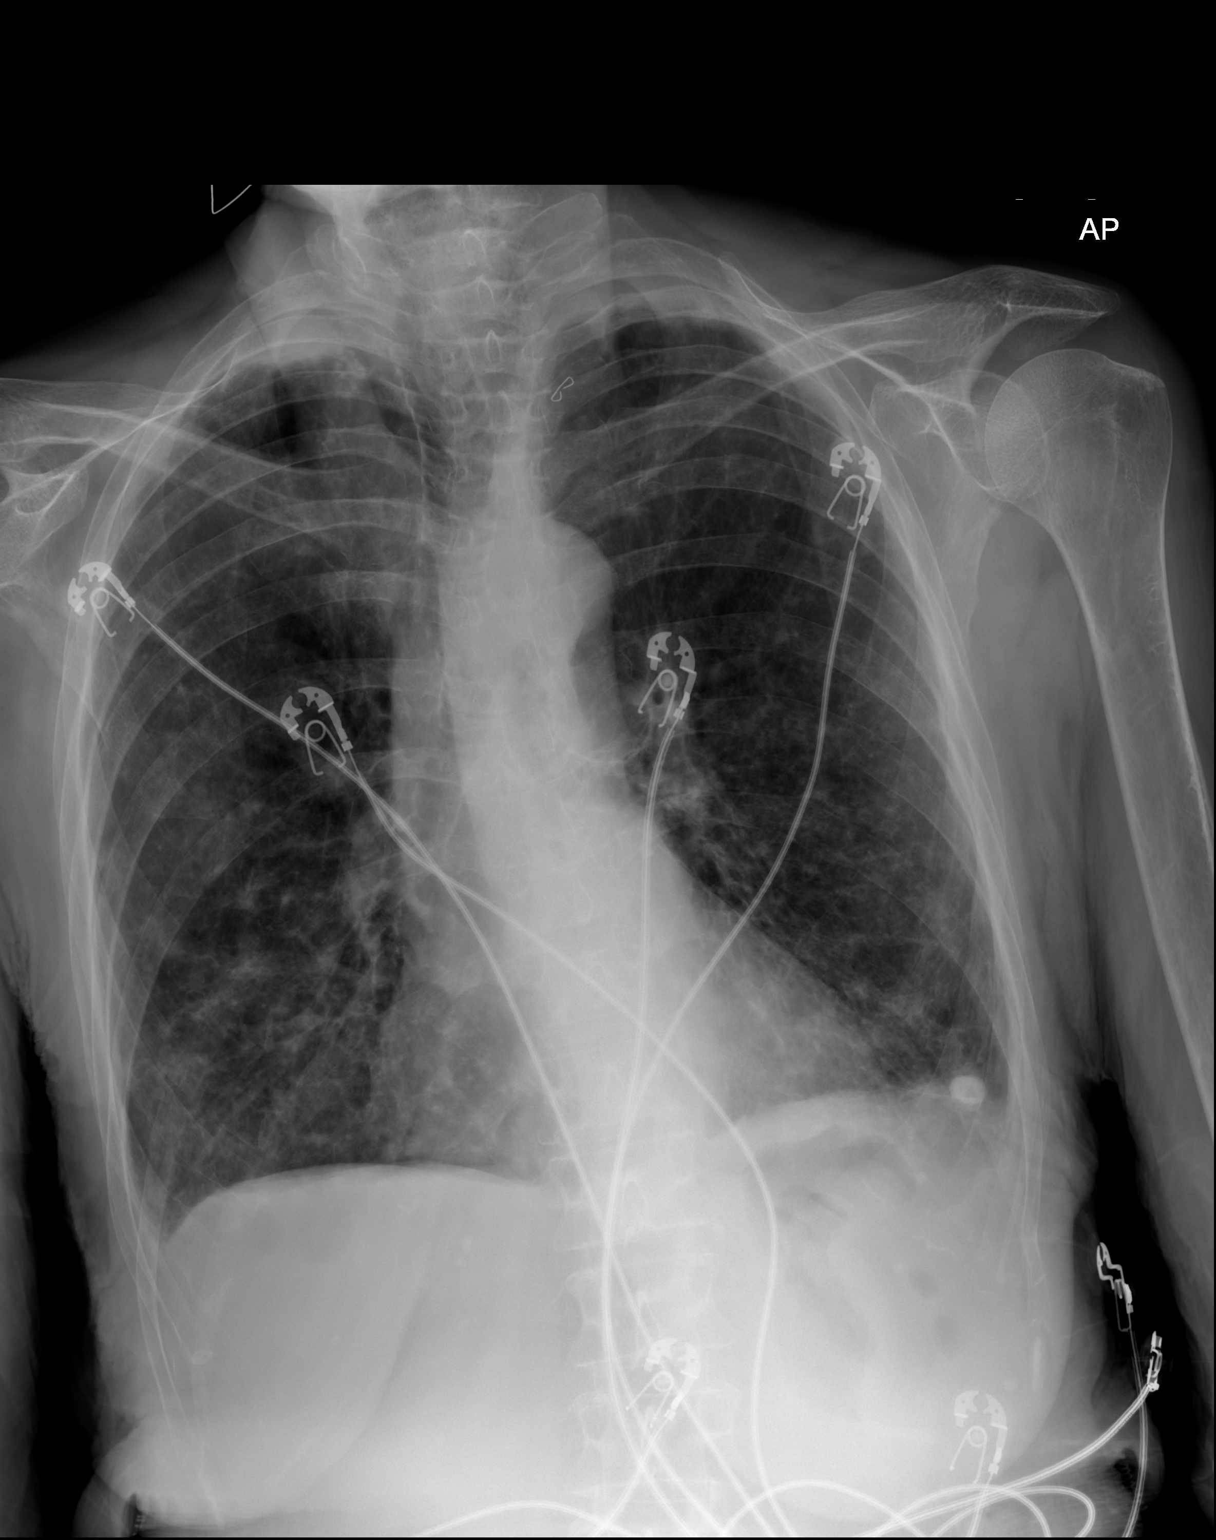

[2 of 2 positions shown; findings below may reference images not displayed]

FINDINGS: Patient rotated right. Midline trachea. Normal heart size and
mediastinal contours for age. Biapical pleural thickening. No
pleural effusion or pneumothorax. Lower lobe predominant, right
greater than left marked pulmonary interstitial thickening. Similar
to slightly improved since the prior exam. No well-defined
superimposed lobar consolidation. Volume loss in the right middle
lobe and/or lingula, most apparent on the lateral view. Progressive.

Left lower lobe calcified granuloma.
IMPRESSION: Similar to slight improvement in lower lobe predominant marked
interstitial thickening. Likely due to peribronchovascular
interstitial opacities and possibly component of bronchiectasis.
Presumably secondary to the clinical history of mycobacterial
infection, chronic. No convincing evidence of acute superimposed
pneumonia.

## 2016-02-22 IMAGING — CT CT HEAD W/O CM
2 series · 16 of 30 positions shown, 20 images · non-contrast
Comparison: 05/20/2006

CLINICAL DATA: Weakness and inability to walk due to weakness.

EXAM:
CT HEAD WITHOUT CONTRAST
TECHNIQUE: Contiguous axial images were obtained from the base of the skull
through the vertex without intravenous contrast.

[Series 2: head w/o · axial · non-contrast · 0.45mm/px · z∈[-132,-12]mm · 13 of 30 slices shown, 17 images]
[im 3/30  brain]
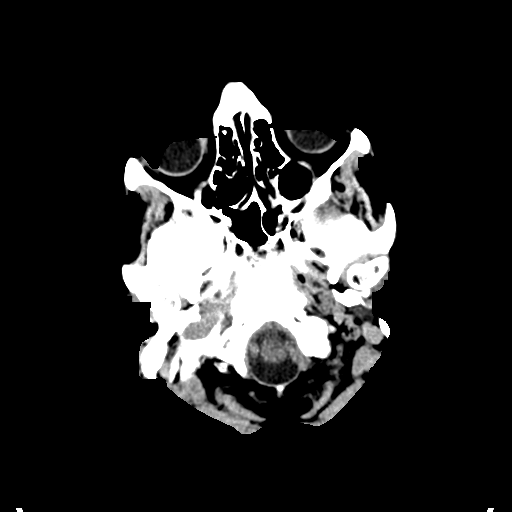
[im 3/30  bone]
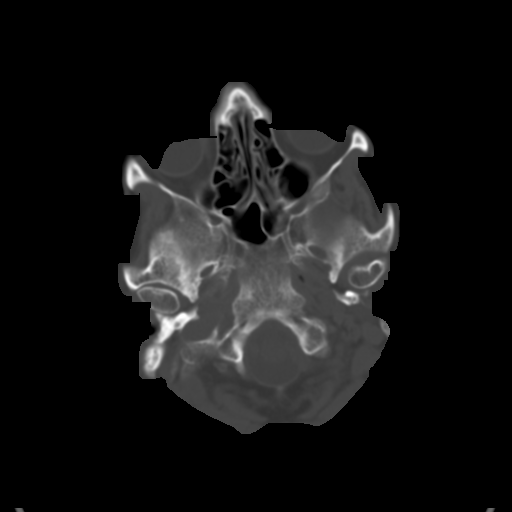
[im 5/30  brain]
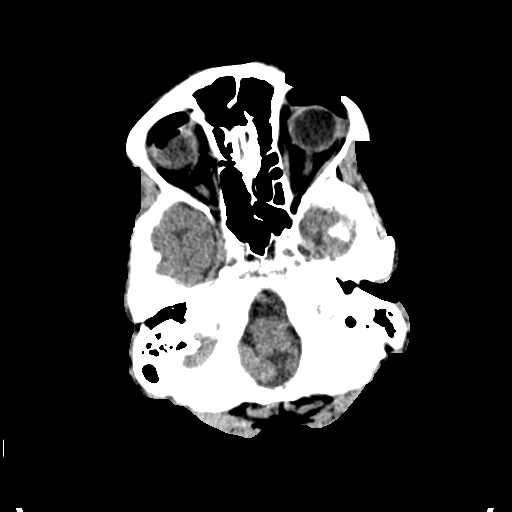
[im 7/30  brain]
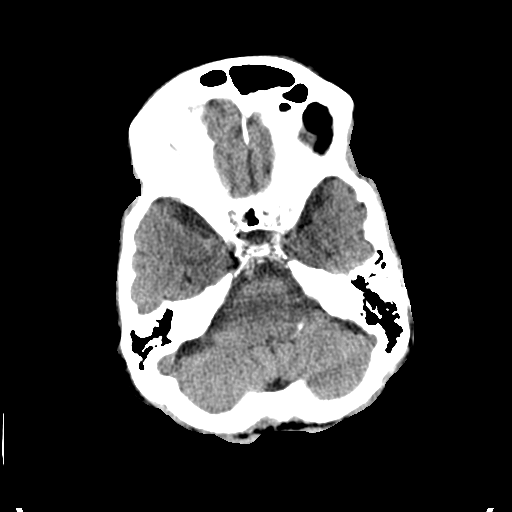
[im 9/30  brain]
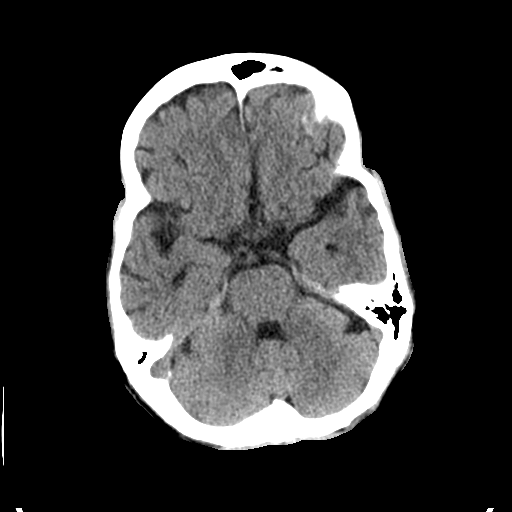
[im 11/30  brain]
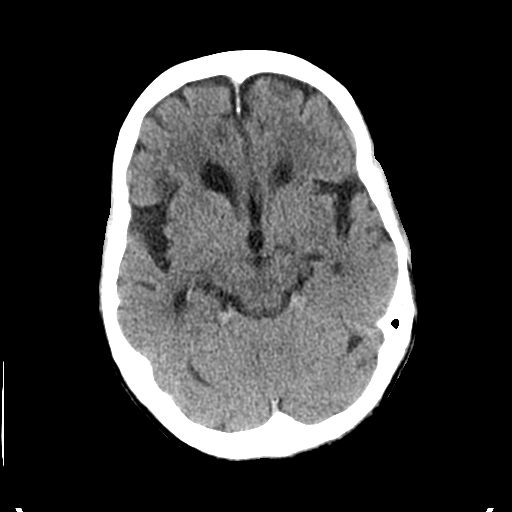
[im 11/30  bone]
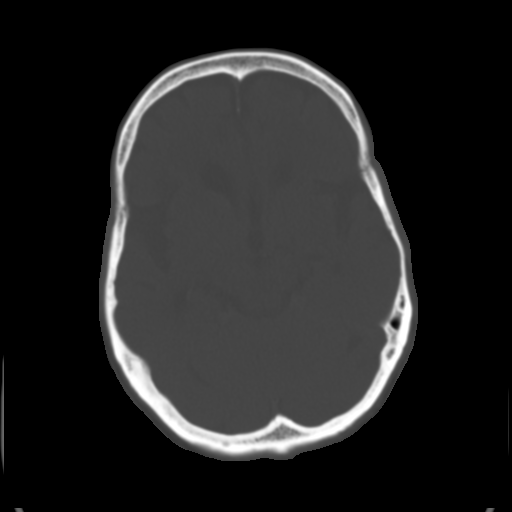
[im 13/30  brain]
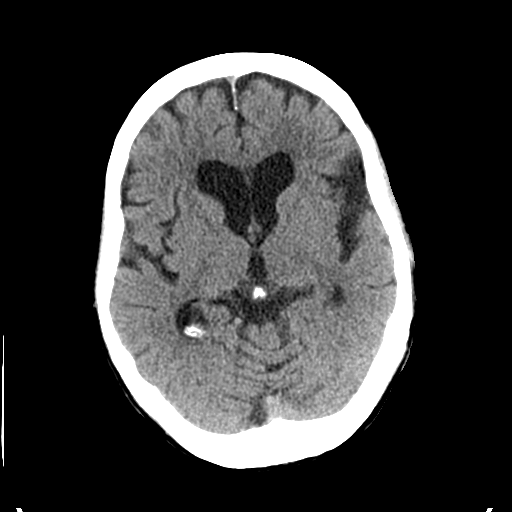
[im 15/30  brain]
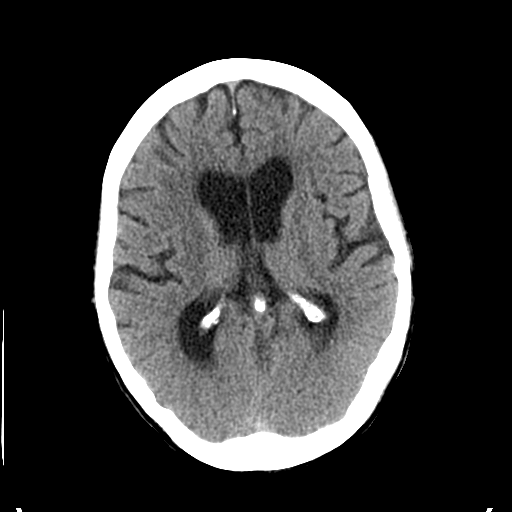
[im 17/30  brain]
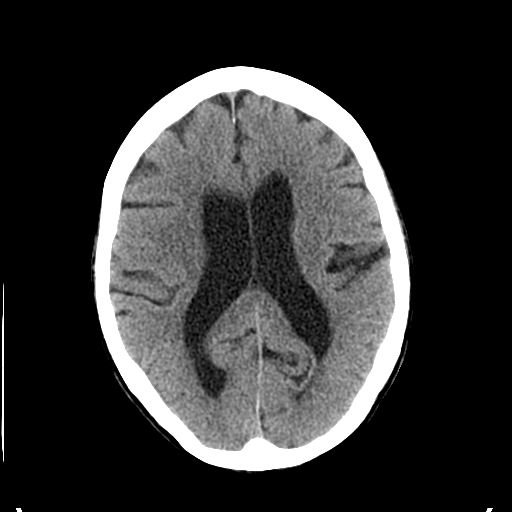
[im 19/30  brain]
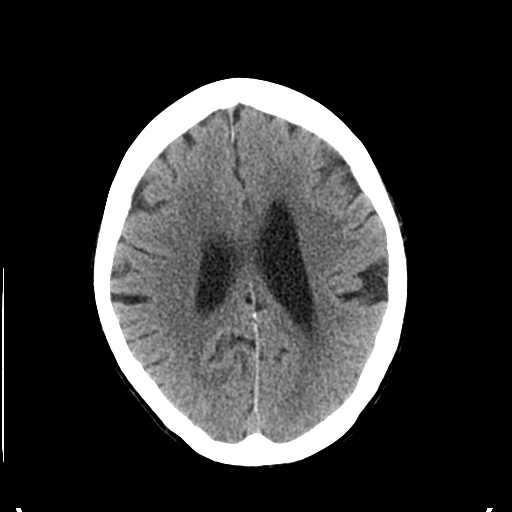
[im 19/30  bone]
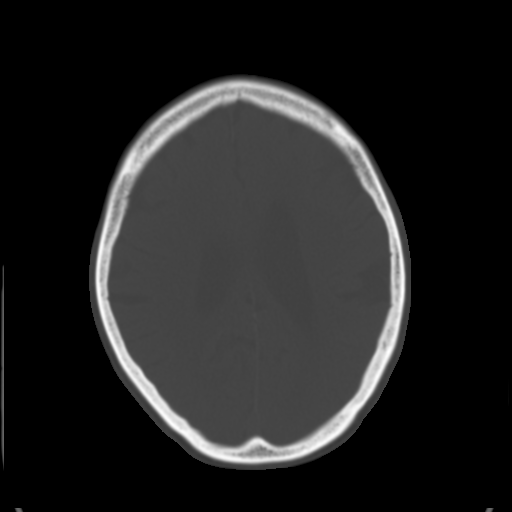
[im 21/30  brain]
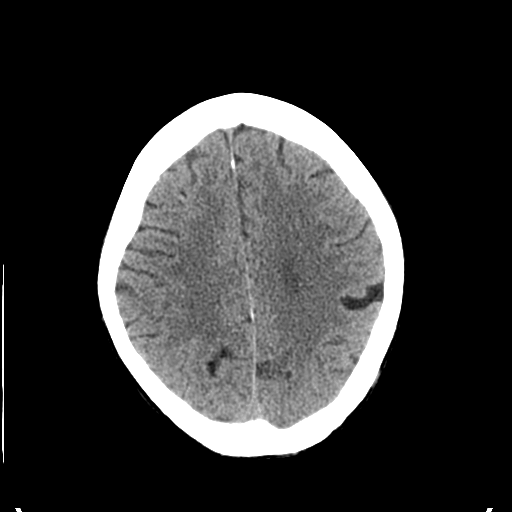
[im 23/30  brain]
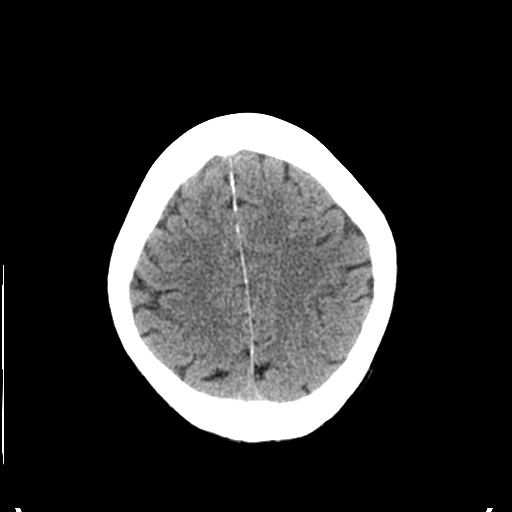
[im 25/30  brain]
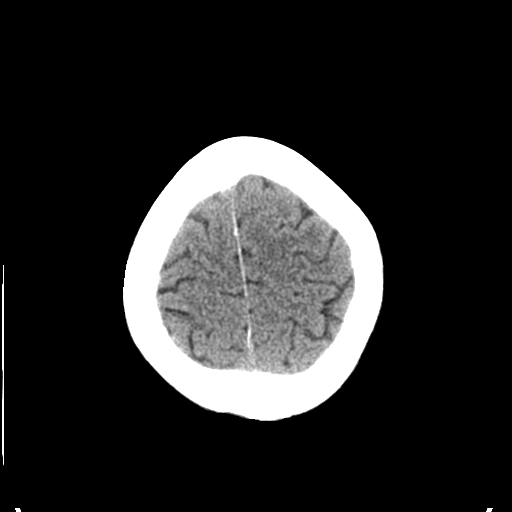
[im 27/30  brain]
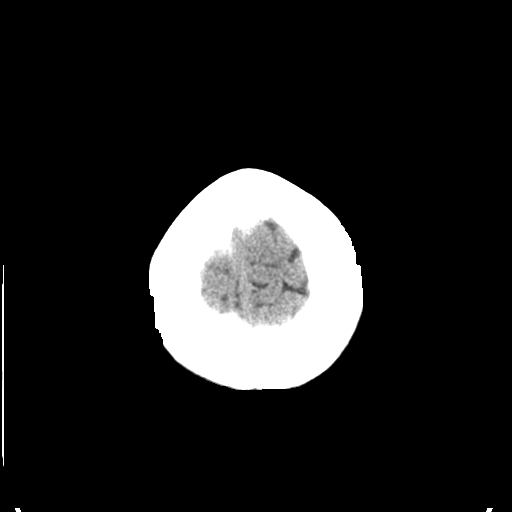
[im 27/30  bone]
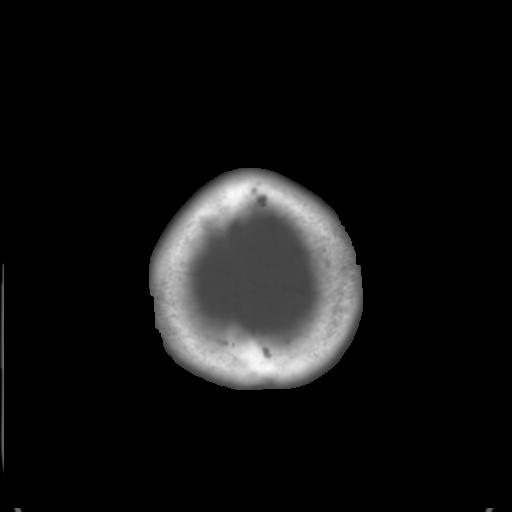

[Series 3: bone windows · axial · 0.45mm/px · z∈[-132,-92]mm · 3 of 30 slices shown]
[im 3/30  bone]
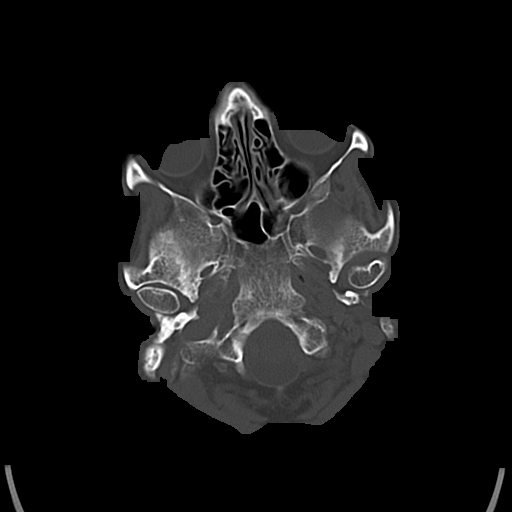
[im 7/30  bone]
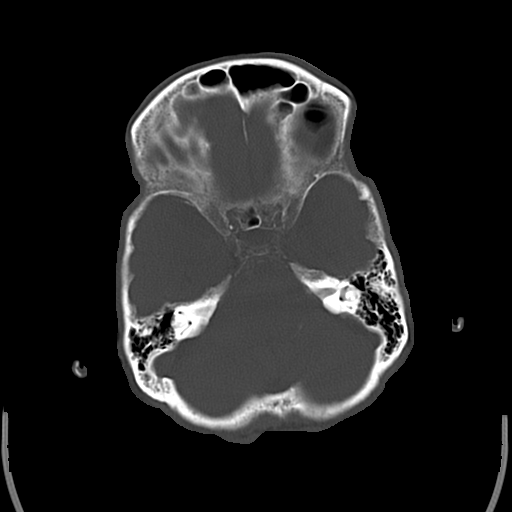
[im 11/30  bone]
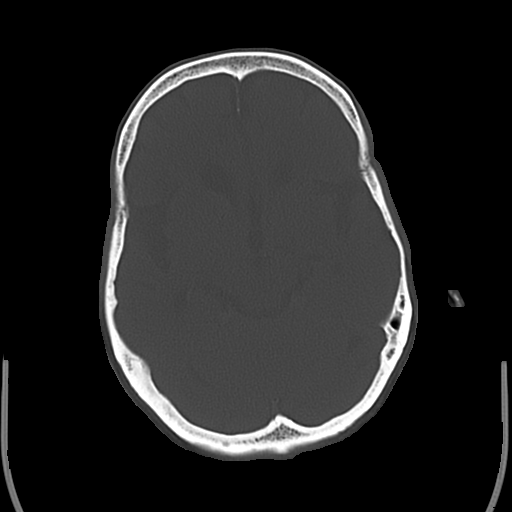

[16 of 30 positions shown; findings below may reference images not displayed]

FINDINGS: There is patchy low attenuation within the subcortical and
periventricular white matter compatible with chronic microvascular
disease. Prominence of the sulci and ventricles identified
consistent with brain atrophy. No acute cortical infarct,
hemorrhage, or mass lesion ispresent. No significant extra-axial
fluid collection is present. The paranasal sinuses andmastoid air
cells are clear. The osseous skull is intact.
IMPRESSION: 1. No acute intracranial abnormality.
2. Chronic microvascular disease and brain atrophy.

## 2016-03-24 NOTE — Telephone Encounter (Signed)
Will start the assistance process.

## 2016-05-03 ENCOUNTER — Encounter: Payer: Self-pay | Admitting: Emergency Medicine

## 2016-05-03 ENCOUNTER — Ambulatory Visit
Admission: EM | Admit: 2016-05-03 | Discharge: 2016-05-03 | Disposition: A | Discharge status: Other Acute Inpatient Hospital | Payer: Medicare PPO | Attending: Emergency Medicine | Admitting: Emergency Medicine

## 2016-05-03 DIAGNOSIS — S0990XA Unspecified injury of head, initial encounter: Secondary | ICD-10-CM | POA: Diagnosis not present

## 2016-05-03 DIAGNOSIS — M25562 Pain in left knee: Secondary | ICD-10-CM | POA: Diagnosis not present

## 2016-05-03 DIAGNOSIS — W19XXXA Unspecified fall, initial encounter: Secondary | ICD-10-CM | POA: Diagnosis not present

## 2016-05-03 NOTE — ED Provider Notes (Signed)
CSN: [id number]     Arrival date & time 05/03/16  1440 History   First MD Initiated Contact with Patient 05/03/16 1624     Chief Complaint  Patient presents with  . Fall  . Knee Pain   (Consider location/radiation/quality/duration/timing/severity/associated sxs/prior Treatment) HPI  This a 75 year old female states yesterday she was walking from the living room to the kitchen her knee legs became very "rubbery" lost control and fell and a spiral type fashion injuring her left knee falling backwards and hitting her head. Did not have any loss of consciousness but did see stars. She states that now her head does not bother her and mostly has because of her knee pain. He states that this has happened on several occasions but not with the injuries that she sustained yesterday. He is not had any personality changes has not a concentration difficulties. Complaint is that of her knee very painful. His had no nausea or vomiting. Is alert and oriented.        Past Medical History:  Diagnosis Date  . Bronchiectasis    colonized mrsa, s/p vancomyin IV 10 days 5/10 and 7 days 6/10  . Headache(784.0)   . History of allergy   . Hypothyroidism   . Mycobacterium avium complex ([place])   . Pneumonia    Past Surgical History:  Procedure Laterality Date  . ABDOMINAL HYSTERECTOMY    . CATARACT EXTRACTION, BILATERAL    . TONSILLECTOMY     t and a   Family History  Problem Relation Age of Onset  . Lung disease Father   . Lung cancer Father   . Lung disease Maternal Aunt   . Parkinson's disease Maternal Aunt   . COPD Maternal Aunt   . Lung disease Maternal Uncle   . COPD Maternal Uncle   . Skin cancer Mother   . Colon cancer Neg Hx    Social History  Substance Use Topics  . Smoking status: Former Smoker    Packs/day: 1.00    Years: 3.00    Types: Cigarettes    Quit date: 06/08/1962  . Smokeless tobacco: Never Used  . Alcohol use No   OB History    No data available     Review of  Systems  Constitutional: Positive for activity change. Negative for chills, fatigue and fever.  Musculoskeletal: Positive for arthralgias, gait problem and joint swelling.  All other systems reviewed and are negative.   Allergies  Ambien [zolpidem tartrate]; Doxycycline; Levofloxacin; Mometasone furoate; Penicillins; and Sulfonamide derivatives  Home Medications   Prior to Admission medications   Medication Sig Start Date End Date Taking? Authorizing Provider  albuterol (PROVENTIL) (2.5 MG/3ML) 0.083% nebulizer solution Take 3 mLs (2.5 mg total) by nebulization every 6 (six) hours as needed (respiratory distress). 07/18/14   Elsie Stain, MD  albuterol (VENTOLIN HFA) 108 (90 BASE) MCG/ACT inhaler Inhale 2 puffs into the lungs every 6 (six) hours as needed. 09/11/14   Elsie Stain, MD  Aspirin-Salicylamide-Caffeine [facility] HEADACHE POWDER PO) Take 1 tablet by mouth daily as needed (headache).     Historical Provider, MD  citalopram (CELEXA) 40 MG tablet TAKE ONE (1) TABLET BY MOUTH EVERY DAY 06/12/14   Elsie Stain, MD  fluticasone [facility]) 50 MCG/ACT nasal spray USE 2 SPRAYS IN [facility] NOSTRIL ONCE DAILY Patient not taking: Reported on 01/23/2016 02/12/15   Elsie Stain, MD  ipratropium-albuterol (DUONEB) 0.5-2.5 (3) MG/3ML SOLN Take 3 mLs by nebulization 3 (three) times daily. Patient not  taking: Reported on 01/23/2016 05/20/15   Javier Glazier, MD  levothyroxine (SYNTHROID, LEVOTHROID) 100 MCG tablet TAKE ONE-HALF TABLET BY MOUTH EVERY MORNING BEFORE BREAKFAST 12/04/14   Elsie Stain, MD  MetroNIDAZOLE (FLAGYL PO) Take by mouth as needed.    Historical Provider, MD  Na Sulfate-K Sulfate-Mg Sulf 17.5-3.13-1.6 GM/180ML SOLN Take 1 kit by mouth once. Patient not taking: Reported on 01/23/2016 12/25/15   Laban Emperor Zehr, PA-C  nystatin-triamcinolone (MYCOLOG II) cream Apply 1 application topically 2 (two) times daily as needed. To the affected area Patient not taking: Reported on  01/23/2016 07/24/14   Elsie Stain, MD  Pancrelipase, Lip-Prot-Amyl, (ZENPEP) 40000 units CPEP Take 2 pills with meals(breakfast, lunch and dinner) and take 1 pill with snacks. 01/02/16   Laban Emperor Zehr, PA-C  pantoprazole (PROTONIX) 40 MG tablet TAKE ONE (1) TABLET BY MOUTH EVERY DAY 02/25/15   Elsie Stain, MD  predniSONE (DELTASONE) 10 MG tablet Take 4 tabs in am x 2 days, 3 tabs in am x 2 days, 2 tabs in am x 2 days, 1 tabs in am x 2 days then stop 01/23/16   Tammy S Parrett, NP  prochlorperazine (COMPAZINE) 10 MG tablet TAKE ONE (1) TABLET BY MOUTH EVERY 6 HOURS AS NEEDED FOR NAUSEA Patient not taking: Reported on 01/23/2016 03/28/14   Elsie Stain, MD  Respiratory Therapy Supplies (FLUTTER) DEVI Use as directed 01/23/16   Melvenia Needles, NP  sodium chloride HYPERTONIC 3 % nebulizer solution Use 4ML two times a day 05/17/15   Javier Glazier, MD  traZODone (DESYREL) 100 MG tablet Take 2 tablets (200 mg total) by mouth at bedtime. 07/18/14   Elsie Stain, MD  triamcinolone (KENALOG) 0.025 % cream APPLY TO AFFECTED AREA THREE TIMES DAILYAS NEEDED 04/09/15   Collene Gobble, MD  WHEY PROTEIN PO Take by mouth daily.    Historical Provider, MD   Meds Ordered and Administered this Visit  Medications - No data to display  BP 103/86 (BP Location: Right Arm)   Pulse 85   Temp 97.9 F (36.6 C) (Oral)   Resp 17   Wt 102 lb (46.3 kg)   SpO2 100%   BMI 17.79 kg/m  No data found.   Physical Exam  Urgent Care Course   Clinical Course     Procedures (including critical care time)  Labs Review Labs Reviewed - No data to display  Imaging Review No results found.   Visual Acuity Review  Right Eye Distance:   Left Eye Distance:   Bilateral Distance:    Right Eye Near:   Left Eye Near:    Bilateral Near:         MDM   1. Fall, initial encounter   2. Acute pain of left knee   3. Head injury, closed, initial encounter    I had a discussion with the patient and her  daughter. Recommended that because of her hitting her head plus uncertain reason why her legs became "rubbery" causing her to fall on several occasions but most recently yesterday to be evaluated at a higher level of care center. BeCause of her age ,although she is not on blood thinners she should have a CAT scan to rule out any sort of a head injury. Patient was upset that she spent time here only to be sent out to the emergency department but try to explain to her we are trying to do the best for her and treat  her appropriately. Transported to the emergency department by her relative in a privately owned vehicle. Patient left our department in stable condition. While in the urgent care the patient O2 sats were 93% room air upon presentation and she was placed on O2 sat 2 L nasal cannula. She states that she has not been able to use her oxygen at home because a just moved.    Lorin Picket, PA-C 05/03/16 1705

## 2016-05-03 NOTE — ED Triage Notes (Signed)
Patient also states that she hit that back of her head on the kitchen floor.

## 2016-05-03 NOTE — ED Triage Notes (Signed)
Patient states that she fell yesterday.  Patient c/o pain in her left knee.  Patient states that her knees gave out and she fell on her left knee.

## 2016-07-21 ENCOUNTER — Other Ambulatory Visit: Payer: Self-pay | Admitting: Gastroenterology

## 2016-09-12 ENCOUNTER — Other Ambulatory Visit: Payer: Self-pay | Admitting: Internal Medicine

## 2016-09-24 ENCOUNTER — Telehealth: Payer: Self-pay | Admitting: Gastroenterology

## 2016-09-28 NOTE — Telephone Encounter (Signed)
Per Patty, Creon denied. If pat calls back she will triage and go from there. Pt cancelled colonoscopy and needs and appointment.

## 2016-12-24 ENCOUNTER — Telehealth: Payer: Self-pay | Admitting: Internal Medicine

## 2016-12-24 ENCOUNTER — Other Ambulatory Visit: Payer: Self-pay | Admitting: Internal Medicine

## 2016-12-24 MED ORDER — PANCRELIPASE (LIP-PROT-AMYL) 36000-114000 UNITS PO CPEP: ORAL_CAPSULE | ORAL | 0 refills | Status: DC

## 2016-12-24 NOTE — Telephone Encounter (Signed)
Patty-  Can you help me with this? I see in a note from 09/24/16 that pt was supposed to call back and talk to you but apparently she never did. She saw Jess last in 2017. Never saw Pyrtle that I can tell. Last rx sent for creon was 09/14/16 for #320 w/1 refill. I dont know where the 5 tablets is coming from that she is referring to. I know this is far fetched, but any recollection of this situation?

## 2016-12-24 NOTE — Telephone Encounter (Signed)
I have spoken to patient who states that she actually got 5 days worth of capsules rather than 5 capsules. She gets 100 capsules at a time as this is all she can afford. She had the rest of her prescription from another pharmacy transferred to a new pharmacy so this would be why she only got 40 capsules. I advised patient that she needs an office visit. She has scheduled an appointment with Doug SouJessica Zehr, PA-C for 01/06/17 @ 10:30 am for follow up. I have sent her enough medication to get her through until that time. Patient verbalizes understanding of this.

## 2016-12-24 NOTE — Telephone Encounter (Signed)
Brooke [name] I do not know anything about it.  I looked back and do not see where she was ever sent 5 tabs.  Maybe she needs to call the pharmacy and see why they only gave her 5?

## 2017-01-06 ENCOUNTER — Ambulatory Visit (INDEPENDENT_AMBULATORY_CARE_PROVIDER_SITE_OTHER): Payer: Medicare PPO | Admitting: Gastroenterology

## 2017-01-06 ENCOUNTER — Encounter: Payer: Self-pay | Admitting: Gastroenterology

## 2017-01-06 VITALS — BP 104/66 | HR 79 | Ht 63.78 in | Wt 90.8 lb

## 2017-01-06 DIAGNOSIS — K529 Noninfective gastroenteritis and colitis, unspecified: Secondary | ICD-10-CM | POA: Insufficient documentation

## 2017-01-06 DIAGNOSIS — K8689 Other specified diseases of pancreas: Secondary | ICD-10-CM | POA: Diagnosis not present

## 2017-01-06 DIAGNOSIS — E43 Unspecified severe protein-calorie malnutrition: Secondary | ICD-10-CM | POA: Diagnosis not present

## 2017-01-06 MED ORDER — PANCRELIPASE (LIP-PROT-AMYL) 36000-114000 UNITS PO CPEP: ORAL_CAPSULE | ORAL | 11 refills | Status: AC

## 2017-01-06 NOTE — Progress Notes (Addendum)
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 She was seen here by me in July 2017 for complaints of chronic diarrhea. Extensive evaluation revealed an extremely low fecal elastase study. She was placed on Creon and has had good results with that. She is taking 36,000 units, 2 with meals and one with snacks. Had been scheduled for colonoscopy as well, but she decided to cancel that after we found this diagnosis and she noticed the improvement with the enzymes. She says that she continues to do well with those. She also uses Imodium on occasion and she thinks that between the 2 of them she has been able to manage her diarrhea for the most part. She is having a lot of other issues, however. She does not eat much and has lost 16 pounds over the past year. She is having a lot more problems with breathing and coughing and feels very weak and tired. She is in the process of finding a new pulmonologist closer to the [place] area as they recently moved there.   Past Medical History:  Diagnosis Date  . Bronchiectasis    colonized mrsa, s/p vancomyin IV 10 days 5/10 and 7 days 6/10  . Headache(784.0)   . History of allergy   . Hypothyroidism   . Mycobacterium avium complex ([place])   . Pneumonia    Past Surgical History:  Procedure Laterality Date  . ABDOMINAL HYSTERECTOMY    . CATARACT EXTRACTION, BILATERAL    . TONSILLECTOMY     t and a    reports that she quit smoking about 54 years ago. Her smoking use included Cigarettes. She has a 3.00 pack-year smoking history. She has never used smokeless tobacco. She reports that she does not drink alcohol or use drugs. family history includes COPD in her maternal aunt and maternal uncle; Lung cancer in her father; Lung disease in her father, maternal aunt, and maternal uncle; Parkinson's disease in her maternal  aunt; Skin cancer in her mother. Allergies  Allergen Reactions  . Ambien [Zolpidem Tartrate]     Sleep walking and altered mental status  . Doxycycline     vomiting  . Levofloxacin Other (See Comments)    Unknown rxn.  Tolerates Cipro  . Mometasone Furoate Other (See Comments)     nasonex causes swollen throat, thrush  . Penicillins Other (See Comments)    Unknown reaction noted on hospice paperwork  . Sulfonamide Derivatives Other (See Comments)    Unknown reaction noted on hospice paperwork      Outpatient Encounter Prescriptions as of 01/06/2017  Medication Sig  . albuterol (PROVENTIL) (2.5 MG/3ML) 0.083% nebulizer solution Take 3 mLs (2.5 mg total) by nebulization every 6 (six) hours as needed (respiratory distress).  Marland Kitchen albuterol (VENTOLIN HFA) 108 (90 BASE) MCG/ACT inhaler Inhale 2 puffs into the lungs every 6 (six) hours as needed.  . Aspirin-Salicylamide-Caffeine (BC HEADACHE POWDER PO) Take 1 tablet by mouth daily as needed (headache).   . citalopram (CELEXA) 40 MG tablet TAKE ONE (1) TABLET BY MOUTH EVERY DAY  . fluticasone (FLONASE) 50 MCG/ACT nasal spray USE 2 SPRAYS IN EACH NOSTRIL ONCE DAILY  . ipratropium-albuterol (DUONEB) 0.5-2.5 (3) MG/3ML SOLN Take 3 mLs by nebulization 3 (three) times daily.  Marland Kitchen levothyroxine (SYNTHROID, LEVOTHROID) 100 MCG tablet TAKE ONE-HALF TABLET BY MOUTH EVERY MORNING BEFORE BREAKFAST  .  lipase/protease/amylase (CREON) 36000 UNITS CPEP capsule TAKE 2 CAPSULE BY MOUTH WITH MEALS AND 1 CAPSULE BY MOUTH WITH SNACKS  . MetroNIDAZOLE (FLAGYL PO) Take by mouth as needed.  . Na Sulfate-K Sulfate-Mg Sulf 17.5-3.13-1.6 GM/180ML SOLN Take 1 kit by mouth once.  . nystatin-triamcinolone (MYCOLOG II) cream Apply 1 application topically 2 (two) times daily as needed. To the affected area  . pantoprazole (PROTONIX) 40 MG tablet TAKE ONE (1) TABLET BY MOUTH EVERY DAY  . predniSONE (DELTASONE) 10 MG tablet Take 4 tabs in am x 2 days, 3 tabs in am x 2 days, 2 tabs  in am x 2 days, 1 tabs in am x 2 days then stop  . prochlorperazine (COMPAZINE) 10 MG tablet TAKE ONE (1) TABLET BY MOUTH EVERY 6 HOURS AS NEEDED FOR NAUSEA  . Respiratory Therapy Supplies (FLUTTER) DEVI Use as directed  . sodium chloride HYPERTONIC 3 % nebulizer solution Use 4ML two times a day  . traZODone (DESYREL) 100 MG tablet Take 2 tablets (200 mg total) by mouth at bedtime.  . triamcinolone (KENALOG) 0.025 % cream APPLY TO AFFECTED AREA THREE TIMES DAILYAS NEEDED  . WHEY PROTEIN PO Take by mouth daily.  . [DISCONTINUED] lipase/protease/amylase (CREON) 36000 UNITS CPEP capsule TAKE 2 CAPSULE BY MOUTH WITH MEALS AND 1 CAPSULE BY MOUTH WITH SNACKS   No facility-administered encounter medications on file as of 01/06/2017.      REVIEW OF SYSTEMS  : All other systems reviewed and negative except where noted in the History of Present Illness.   PHYSICAL EXAM: BP 104/66   Pulse 79   Ht 5' 3.78" (1.62 m)   Wt 90 lb 12.8 oz (41.2 kg)   BMI 15.69 kg/m  General: Thin and frail white female in no acute distress Head: Normocephalic and atraumatic Eyes:  Sclerae anicteric, conjunctiva pink. Ears: Normal auditory acuity Lungs: Wheezing heard B/L.  She does seem to have so increased WOB. Heart: Regular rate and rhythm; no M/R/G. Abdomen: Soft, non-distended.  BS present.  Non-tender. Musculoskeletal: Symmetrical with no gross deformities  Skin: No lesions on visible extremities Extremities: No edema  Neurological: Alert oriented x 4, grossly non-focal Psychological:  Alert and cooperative. Normal mood and affect  ASSESSMENT AND PLAN: *Severe pancreatic insufficiency causing chronic diarrhea:  Creon has helped significantly. Between the pancreatic enzymes and occasional Imodium she has been able to manage her diarrhea well for the most part. We'll continue these. *Severe malnutrition with 16 pound weight loss over the past 1 year.  She does not eat well.  *Bronchiectesis:  On chronic  oxygen for several years.  She is having a lot more breathing issues along with coughing recently.  Trying to get in to see a new pulmonologist. *Anxiety   CC:  Sandi Mariscal, MD  Addendum: Reviewed and agree with current management. Prior cross-sectional imaging one year ago, CT scan abdomen and pelvis with contrast showed no focal acute abnormalities. The pancreas was seen well without mass inflammatory change or other abnormality. Agree with pulmonology involvement for bronchiectasis Pyrtle, Lajuan Lines, MD

## 2017-01-06 NOTE — Patient Instructions (Addendum)
We have sent the following medications to your pharmacy for you to pick up at your convenience: Creon  Continue Imodium  Consider daily probiotic.

## 2017-02-28 ENCOUNTER — Emergency Department
Admission: EM | Admit: 2017-02-28 | Discharge: 2017-02-28 | Disposition: A | Discharge status: Home / Self Care | Payer: Medicare PPO | Attending: Emergency Medicine | Admitting: Emergency Medicine

## 2017-02-28 ENCOUNTER — Emergency Department: Payer: Medicare PPO

## 2017-02-28 DIAGNOSIS — Z79899 Other long term (current) drug therapy: Secondary | ICD-10-CM | POA: Insufficient documentation

## 2017-02-28 DIAGNOSIS — E039 Hypothyroidism, unspecified: Secondary | ICD-10-CM | POA: Diagnosis not present

## 2017-02-28 DIAGNOSIS — Z87891 Personal history of nicotine dependence: Secondary | ICD-10-CM | POA: Insufficient documentation

## 2017-02-28 DIAGNOSIS — K591 Functional diarrhea: Secondary | ICD-10-CM | POA: Insufficient documentation

## 2017-02-28 DIAGNOSIS — G8929 Other chronic pain: Secondary | ICD-10-CM | POA: Diagnosis not present

## 2017-02-28 DIAGNOSIS — R197 Diarrhea, unspecified: Secondary | ICD-10-CM | POA: Diagnosis present

## 2017-02-28 LAB — COMPREHENSIVE METABOLIC PANEL
ALK PHOS: 66 U/L (ref 38–126)
ALT: 7 U/L — AB (ref 14–54)
AST: 20 U/L (ref 15–41)
Albumin: 2.9 g/dL — ABNORMAL LOW (ref 3.5–5.0)
Anion gap: 10 (ref 5–15)
BILIRUBIN TOTAL: 0.2 mg/dL — AB (ref 0.3–1.2)
BUN: 9 mg/dL (ref 6–20)
CALCIUM: 8.8 mg/dL — AB (ref 8.9–10.3)
CO2: 29 mmol/L (ref 22–32)
CREATININE: 0.91 mg/dL (ref 0.44–1.00)
Chloride: 101 mmol/L (ref 101–111)
GFR calc non Af Amer: 59 mL/min — ABNORMAL LOW (ref >60)
GLUCOSE: 85 mg/dL (ref 65–99)
Potassium: 4.4 mmol/L (ref 3.5–5.1)
SODIUM: 140 mmol/L (ref 135–145)
TOTAL PROTEIN: 6.7 g/dL (ref 6.5–8.1)

## 2017-02-28 LAB — CBC
HCT: 33.2 % — ABNORMAL LOW (ref 35.0–47.0)
Hemoglobin: 11.4 g/dL — ABNORMAL LOW (ref 12.0–16.0)
MCH: 30.1 pg (ref 26.0–34.0)
MCHC: 34.3 g/dL (ref 32.0–36.0)
MCV: 87.8 fL (ref 80.0–100.0)
PLATELETS: 339 10*3/uL (ref 150–440)
RBC: 3.78 MIL/uL — AB (ref 3.80–5.20)
RDW: 15 % — ABNORMAL HIGH (ref 11.5–14.5)
WBC: 9.5 10*3/uL (ref 3.6–11.0)

## 2017-02-28 LAB — LIPASE, BLOOD: Lipase: 30 U/L (ref 11–51)

## 2017-02-28 MED ORDER — SODIUM CHLORIDE 0.9 % IV BOLUS (SEPSIS)
500.0000 mL | Freq: Once | INTRAVENOUS | Status: AC
  Administered 2017-02-28: 500 mL via INTRAVENOUS

## 2017-02-28 MED ORDER — IOPAMIDOL (ISOVUE-300) INJECTION 61%
75.0000 mL | Freq: Once | INTRAVENOUS | Status: AC | PRN
  Administered 2017-02-28: 75 mL via INTRAVENOUS

## 2017-02-28 MED ORDER — IOPAMIDOL (ISOVUE-300) INJECTION 61%
30.0000 mL | Freq: Once | INTRAVENOUS | Status: AC | PRN
  Administered 2017-02-28: 30 mL via ORAL

## 2017-02-28 NOTE — ED Notes (Signed)
No episodes of diarrhea since at hospital. Pt given resources by Child psychotherapist. Daughter on way to pick up patient.

## 2017-02-28 NOTE — ED Triage Notes (Signed)
Pt came to ED via EMS from home, lives with daughter. Pt has had abdominal pain/diarrhea that is not a new issue. Pt has seen doctors before, has been on pain medication at some point. Pt also reports is concerned about living at home with daughter, states she is a burden to her daughter and her daughter is not taking care of her. Reports has not been eating well.

## 2017-02-28 NOTE — Clinical Social Work Note (Signed)
CSW met with patient to discuss differences between nursing homes, family care homes, assisted living facilities (ALFs), and independent living facilities.  Patient expressed that she was interested in finding out more about ALFs.  CSW gave patient information on ALFs and family care homes.  Patient was also given information about in home care, CSW will contact APS to discuss patient's case.  Patient will be discharging back home today with her daughter, CSW to sign off.  Jones Broom. Norval Morton, MSW, [place]  02/28/2017 3:48 PM

## 2017-02-28 NOTE — ED Notes (Signed)
MD notified about pts concerns at home. Social work and chaplain consulted.

## 2017-02-28 NOTE — ED Notes (Signed)
Social Worker at bedside.

## 2017-02-28 NOTE — ED Notes (Signed)
Daughter here to pick up pt. Discharge instructions went over with pt. E-signature not working.

## 2017-02-28 NOTE — ED Provider Notes (Signed)
[facility]  ____________________________________________   I have reviewed the triage vital signs and the nursing notes.   HISTORY  Chief Complaint Diarrhea    HPI Brooke [name] is a 76 y.o. female  Presents today complaining of diarrhea. Has chronic diarrhea but states that it is significantly worse over the last couple of weeks. She states she's lost 20 pounds. This am blood pressure is in the low 90s she states. She denies vomiting or fever. She denies recent travel. She states that diarrhea is "mucousy", she denies melena or bright red blood per rectum or hematemesis. She has diffuse mild abdominal discomfort which is chronic. No change in that. No focal pain. She states her family does not understand the significance of her diarrhea. 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 She is also upset that her daughter does not understand the significance of her various illnesses. She denies being abused.    Past Medical History:  Diagnosis Date  . Bronchiectasis    colonized mrsa, s/p vancomyin IV 10 days 5/10 and 7 days 6/10  . Headache(784.0)   . History of allergy   . Hypothyroidism   . Mycobacterium avium complex ([place])   . Pneumonia     Patient Active Problem List   Diagnosis Date Noted  . Chronic diarrhea 01/06/2017  . Pancreatic insufficiency 01/06/2017  . Chronic pain syndrome 01/02/2015  . Muscular deconditioning 08/18/2014  . Weakness of both legs 08/16/2014  . Sacral decubitus ulcer 08/16/2014  . DNR (do not resuscitate) 07/20/2014  . Encounter for palliative care 07/06/2013  . Chronic respiratory failure ([place]) 10/05/2012  . Gout 09/24/2011  . Severe protein-calorie malnutrition ([place]) 09/24/2011  . Diarrhea 09/24/2011  . Anemia 09/24/2011  . 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  HYPOTHYROIDISM 08/19/2007  . BRONCHIECTASIS 08/19/2007  . ALLERGY, HX OF 08/19/2007    Past Surgical History:  Procedure Laterality Date  . ABDOMINAL HYSTERECTOMY    . CATARACT EXTRACTION, BILATERAL    . TONSILLECTOMY     t and a    Prior to Admission medications   Medication Sig Start Date End Date Taking? Authorizing Provider  albuterol (PROVENTIL) (2.5 MG/3ML) 0.083% nebulizer solution Take 3 mLs (2.5 mg total) by nebulization every 6 (six) hours as needed (respiratory distress). 07/18/14   Elsie Stain, MD  albuterol (VENTOLIN HFA) 108 (90 BASE) MCG/ACT inhaler Inhale 2 puffs into the lungs every 6 (six) hours as needed. 09/11/14   Elsie Stain, MD  Aspirin-Salicylamide-Caffeine (BC HEADACHE POWDER PO) Take 1 tablet by mouth daily as needed (headache).     [provider]  citalopram (CELEXA) 40 MG tablet TAKE ONE (1) TABLET BY MOUTH EVERY DAY 06/12/14   Elsie Stain, MD  fluticasone [facility]) 50 MCG/ACT nasal spray USE 2 SPRAYS IN EACH NOSTRIL ONCE DAILY 02/12/15   Elsie Stain, MD  ipratropium-albuterol (DUONEB) 0.5-2.5 (3) MG/3ML SOLN Take 3 mLs by nebulization 3 (three) times daily. 05/20/15   Javier Glazier, MD  levothyroxine (SYNTHROID, LEVOTHROID) 100 MCG tablet TAKE ONE-HALF TABLET BY MOUTH EVERY MORNING BEFORE BREAKFAST 12/04/14   Elsie Stain, MD  lipase/protease/amylase (CREON) 36000 UNITS CPEP capsule TAKE 2 CAPSULE BY MOUTH WITH MEALS AND 1 CAPSULE BY MOUTH WITH SNACKS 01/06/17   Zehr, Janett Billow D, PA-C  MetroNIDAZOLE (FLAGYL PO) Take by mouth as needed.  [provider]  Na Sulfate-K Sulfate-Mg Sulf 17.5-3.13-1.6 GM/180ML SOLN Take 1 kit by mouth once. 12/25/15   Zehr, Laban Emperor, PA-C  nystatin-triamcinolone (MYCOLOG II) cream Apply 1 application topically 2 (two) times daily as needed. To the affected area 07/24/14   Elsie Stain, MD  pantoprazole (PROTONIX) 40 MG tablet TAKE ONE (1) TABLET BY MOUTH EVERY DAY 02/25/15   Elsie Stain, MD  predniSONE (DELTASONE) 10 MG tablet Take 4 tabs in am x 2 days, 3 tabs in am x 2 days, 2 tabs in am x 2 days, 1 tabs in am x 2 days then stop 01/23/16   Parrett, Tammy S, NP  prochlorperazine (COMPAZINE) 10 MG tablet TAKE ONE (1) TABLET BY MOUTH EVERY 6 HOURS AS NEEDED FOR NAUSEA 03/28/14   Elsie Stain, MD  Respiratory Therapy Supplies (FLUTTER) DEVI Use as directed 01/23/16   Parrett, Fonnie Mu, NP  sodium chloride HYPERTONIC 3 % nebulizer solution Use 4ML two times a day 05/17/15   Javier Glazier, MD  traZODone (DESYREL) 100 MG tablet Take 2 tablets (200 mg total) by mouth at bedtime. 07/18/14   Elsie Stain, MD  triamcinolone (KENALOG) 0.025 % cream APPLY TO AFFECTED AREA THREE TIMES DAILYAS NEEDED 04/09/15   Collene Gobble, MD  WHEY PROTEIN PO Take by mouth daily.    [provider]    Allergies Ambien [zolpidem tartrate]; Doxycycline; Levofloxacin; Mometasone furoate; Penicillins; and Sulfonamide derivatives  Family History  Problem Relation Age of Onset  . Lung disease Father   . Lung cancer Father   . Lung disease Maternal Aunt   . Parkinson's disease Maternal Aunt   . COPD Maternal Aunt   . Lung disease Maternal Uncle   . COPD Maternal Uncle   . Skin cancer Mother   . Colon cancer Neg Hx   . Stomach cancer Neg Hx     Social History Social History  Substance Use Topics  . Smoking status: Former Smoker    Packs/day: 1.00    Years: 3.00    Types: Cigarettes    Quit date: 06/08/1962  . Smokeless tobacco: Never Used  . Alcohol use No    Review of Systems Constitutional: No fever/chills Eyes: No visual changes. ENT: No sore throat. No stiff neck no neck pain Cardiovascular: Denies chest pain. Respiratory: Denies shortness of breath. Gastrointestinal:   no vomiting.  No diarrhea.  No constipation. Genitourinary: Negative for dysuria. Musculoskeletal: Negative lower extremity swelling Skin: Negative for rash. Neurological: Negative for  severe headaches, focal weakness or numbness.   ____________________________________________   PHYSICAL EXAM:  VITAL SIGNS: ED Triage Vitals  Enc Vitals Group     BP 02/28/17 1125 124/84     Pulse Rate 02/28/17 1125 76     Resp 02/28/17 1125 16     Temp 02/28/17 1125 97.8 F (36.6 C)     Temp Source 02/28/17 1125 Oral     SpO2 02/28/17 1125 99 %     Weight 02/28/17 1126 83 lb 6.4 oz (37.8 kg)     Height 02/28/17 1126 5' 4" (1.626 m)     Head Circumference --      Peak Flow --      Pain Score --      Pain Loc --      Pain Edu? --      Excl. in [place]? --     Constitutional: Alert and oriented. Well appearing and in no acute distress.upset emotionally  at times but not in any medical distress Eyes: Conjunctivae are normal Head: Atraumatic HEENT: No congestion/rhinnorhea. Mucous membranes are moist.  Oropharynx non-erythematous Neck:   Nontender with no meningismus, no masses, no stridor Cardiovascular: Normal rate, regular rhythm. Grossly normal heart sounds.  Good peripheral circulation. Respiratory: Normal respiratory effort.  No retractions. Lungs CTAB. Abdominal: Soft and or mild diffuse tenderness. No distention. No guarding no rebound Back:  There is no focal tenderness or step off.  there is no midline tenderness there are no lesions noted. there is no CVA tenderness Musculoskeletal: No lower extremity tenderness, no upper extremity tenderness. No joint effusions, no DVT signs strong distal pulses no edema Neurologic:  Normal speech and language. No gross focal neurologic deficits are appreciated.  Skin:  Skin is warm, dry and intact. No rash noted.no large decubitus noted Psychiatric: Mood and affect are normal. Speech and behavior are normal.  ____________________________________________   LABS (all labs ordered are listed, but only abnormal results are displayed)  Labs Reviewed  COMPREHENSIVE METABOLIC PANEL - Abnormal; Notable for the following:       Result Value    Calcium 8.8 (*)    Albumin 2.9 (*)    ALT 7 (*)    Total Bilirubin 0.2 (*)    GFR calc non Af Amer 59 (*)    All other components within normal limits  CBC - Abnormal; Notable for the following:    RBC 3.78 (*)    Hemoglobin 11.4 (*)    HCT 33.2 (*)    RDW 15.0 (*)    All other components within normal limits  GASTROINTESTINAL PANEL BY PCR, STOOL (REPLACES STOOL CULTURE)  C DIFFICILE QUICK SCREEN W PCR REFLEX  LIPASE, BLOOD  URINALYSIS, COMPLETE (UACMP) WITH MICROSCOPIC    Pertinent labs  results that were available during my care of the patient were reviewed by me and considered in my medical decision making (see chart for details). ____________________________________________  EKG  I personally interpreted any EKGs ordered by me or triage  ____________________________________________  RADIOLOGY  Pertinent labs & imaging results that were available during my care of the patient were reviewed by me and considered in my medical decision making (see chart for details). If possible, patient and/or family made aware of any abnormal findings. ____________________________________________    PROCEDURES  Procedure(s) performed: None  Procedures  Critical Care performed: None  ____________________________________________   INITIAL IMPRESSION / ASSESSMENT AND PLAN / ED COURSE  Pertinent labs & imaging results that were available during my care of the patient were reviewed by me and considered in my medical decision making (see chart for details).  patient here with worsening of her chronic diarrhea, feeling generally weak and having weight loss. We'll obtain CT scan to rule out oncologic or other pathologies. Blood work is reassuring. Vital signs are reassuring for her baseline as she reports it. We will send stool for stool culture. This is a chronic problems unclear exactly how much of this is acute. Patient is tolerating by mouth here we will reassess closely after  imaging.    ____________________________________________   FINAL CLINICAL IMPRESSION(S) / ED DIAGNOSES  Final diagnoses:  None      This chart was dictated using voice recognition software.  Despite best efforts to proofread,  errors can occur which can change meaning.      Schuyler Amor, MD 02/28/17 1230

## 2017-02-28 NOTE — Clinical Social Work Note (Signed)
Clinical Social Work Assessment  Patient Details  Name: Brooke [name] MRN: [id number] Date of Birth: 08-20-1940  Date of referral:  02/28/17               Reason for consult:  Abuse/Neglect, Community Resources                Permission sought to share information with:  Family Supports Permission granted to share information::  Yes, Verbal Permission Granted  Name::     Alphonsus Sias Daughter [phone]  [phone]   Agency::     Relationship::     Contact Information:     Housing/Transportation Living arrangements for the past 2 months:  Single Family Home Source of Information:  Patient Patient Interpreter Needed:  None Criminal Activity/Legal Involvement Pertinent to Current Situation/Hospitalization:  No - Comment as needed Significant Relationships:  Adult Children Lives with:  Adult Children Do you feel safe going back to the place where you live?  Yes Need for family participation in patient care:  No (Coment)  Care giving concerns:  Patient feels like she is a burden on her family which she lives with.  She is interested in looking at Assisted living facilities.   Social Worker assessment / plan:  Patient is a 76 year old female who is alert and oriented x4.  Patient lives with her daughter in a house, patient states that she stays in her room most of the time and her family does not help her with meals when she asks.  Patient states that her family does not cook very often and when she calls them they don't answer her.  Patient says she would like some extra help in the home, and may think about going to an ALF.  CSW provided her resources for in home care, and also ALFs that she and her family can look over.  CSW suggested that patient and her family look at different facilities and call them looking about prices.  Patient expressed that she does sometimes heat meals up in the home.  Patient also expressed that she is on oxygen 24/7, she has portable tanks, and also a  concentrator.  Patient states that she does feel safe going back home, and denies any physical abuse.  Patient did not express any concerns regarding her finances, patient states she will look into applying for Medicaid, CSW informed her to give DSS a call to see if she is eligible.   Employment status:  Retired Database administrator PT Recommendations:  Not assessed at this time Information / Referral to community resources:  APS (Comment Required: [place], Name & Number of worker spoken with), Other (Comment Required) (In home care agencies)  Patient/Family's Response to care:  Patient is agreeable to returning back home with her family.  Patient/Family's Understanding of and Emotional Response to Diagnosis, Current Treatment, and Prognosis:  Patient expressed she feels like she is a burden to her family at times, because she calls them and they do not always answer.  She does feel comfortable returning back home though.  Emotional Assessment Appearance:  Appears stated age Attitude/Demeanor/Rapport:    Affect (typically observed):  Stable, Appropriate Orientation:  Oriented to Self, Oriented to Place, Oriented to  Time, Oriented to Situation Alcohol / Substance use:  Not Applicable Psych involvement (Current and /or in the community):  No (Comment)  Discharge Needs  Concerns to be addressed:  Other (Comment Required (Patient feels like she needs some in home care) Readmission within  the last 30 days:  No Current discharge risk:  Lack of support system Barriers to Discharge:  No Barriers Identified   [place] Constable 02/28/2017, 3:33 PM

## 2017-02-28 NOTE — ED Notes (Signed)
Chaplain at bedside

## 2017-02-28 NOTE — Progress Notes (Signed)
CH responded to an OR for ED-01. RN stated that the Pt could use someone to talk to. CH provided some warm blankets as the Pt was cold. Pt spoke of what she termed "benign neglect". She feels that she is not receiving the care she needs from her daughter. (Meals, medicine, care) She feels that if this continues, she will not be able to survive. CH provided the ministry of empathetic listening, hospitality, and prayer. CH informed the RN of our conversation. RN stated the Case Management and social work are aware.    02/28/17 1200  Clinical Encounter Type  Visited With Patient;Health care provider  Visit Type Initial;Spiritual support;ED  Referral From Nurse  Consult/Referral To Chaplain;Social work  Spiritual Encounters  Spiritual Needs Prayer;Emotional

## 2017-03-03 ENCOUNTER — Other Ambulatory Visit
Admit: 2017-03-03 | Discharge: 2017-03-03 | Disposition: A | Discharge status: Home / Self Care | Payer: Medicare PPO | Source: Ambulatory Visit | Attending: Gastroenterology | Admitting: Gastroenterology

## 2017-03-03 ENCOUNTER — Ambulatory Visit (INDEPENDENT_AMBULATORY_CARE_PROVIDER_SITE_OTHER): Payer: Medicare PPO | Admitting: Gastroenterology

## 2017-03-03 ENCOUNTER — Encounter: Payer: Self-pay | Admitting: Gastroenterology

## 2017-03-03 ENCOUNTER — Other Ambulatory Visit: Payer: Self-pay

## 2017-03-03 VITALS — BP 110/70 | HR 80 | Temp 98.1°F | Ht 63.78 in | Wt 83.0 lb

## 2017-03-03 DIAGNOSIS — K529 Noninfective gastroenteritis and colitis, unspecified: Secondary | ICD-10-CM | POA: Insufficient documentation

## 2017-03-03 LAB — FOLATE: Folate: 6.9 ng/mL (ref >5.9)

## 2017-03-03 LAB — FERRITIN: FERRITIN: 24 ng/mL (ref 11–307)

## 2017-03-03 NOTE — Progress Notes (Signed)
Cephas Darby, MD [address]  [place], [place] [address]  Main: [phone]  Fax: [phone]    Gastroenterology Consultation  Referring Provider:     Sandi Mariscal, MD Primary Care Physician:  Sandi Mariscal, MD Primary Gastroenterologist:  Dr. Cephas Darby Reason for Consultation:     Chronic diarrhea, weight loss        HPI:   Brooke [name] is a 76 y.o. female referred by Dr. Sandi Mariscal, MD  for consultation & management of Chronic diarrhea and failure to thrive. She presents with her daughter today and concerned about chronic diarrhea that has started in 2017. She was initially seen by the [facility] in [place], [place], [place] who diagnosed her with severe exocrine pancreatic insufficiency, pancreatic fecal elastase 71. She was started on Creon 36,000 units 2 capsules with meals and one capsule with snack. Her stool studies were negative for infectious etiology. TTG IgA and total IgA were normal. Stool lactoferrin came back positive. TSH normal CT A/P with IV contrast was negative in 12/2015 and 02/2017.   She is here to establish care. She continues to have diarrhea despite taking Creon as prescribed. She takes Imodium which helps her diarrhea to some extent. She has several bouts of runny, nonbloody bowel movements daily associated with fecal incontinence. She is extremely tired, poor appetite, continues to lose weight. She is living with her daughter currently who is fixing her meals daily. She also started taking PediaSure daily. She denies rectal bleeding. She reports severe abdominal cramps associated with diarrhea. She had a colonoscopy several years ago. He has normocytic anemia. Unknown B12 level. Her ferritin was 68 in 09/2011, folate 7.4 in 09/2011. She does not have chronic liver disease/cirrhosis She denies any GI surgeries She has been taking BC powder chronically for several years for chronic headaches up to 2 times daily. 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 She has bronchiectasis.  GI Procedures: Colonoscopy several years ago  Past Medical History:  Diagnosis Date  . Bronchiectasis    colonized mrsa, s/p vancomyin IV 10 days 5/10 and 7 days 6/10  . Headache(784.0)   . History of allergy   . Hypothyroidism   . Mycobacterium avium complex ([place])   . Pneumonia     Past Surgical History:  Procedure Laterality Date  . ABDOMINAL HYSTERECTOMY    . CATARACT EXTRACTION, BILATERAL    . TONSILLECTOMY     t and a    Prior to Admission medications   Medication Sig Start Date End Date Taking? Authorizing Provider  albuterol (PROVENTIL) (2.5 MG/3ML) 0.083% nebulizer solution Take 3 mLs (2.5 mg total) by nebulization every 6 (six) hours as needed (respiratory distress). 07/18/14  Yes Elsie Stain, MD  albuterol (VENTOLIN HFA) 108 (90 BASE) MCG/ACT inhaler Inhale 2 puffs into the lungs every 6 (six) hours as needed. 09/11/14  Yes Elsie Stain, MD  citalopram (CELEXA) 40 MG tablet TAKE ONE (1) TABLET BY MOUTH EVERY DAY 06/12/14  Yes Elsie Stain, MD  levothyroxine (SYNTHROID, LEVOTHROID) 100 MCG tablet TAKE ONE-HALF TABLET BY MOUTH EVERY MORNING BEFORE BREAKFAST 12/04/14  Yes Elsie Stain, MD  lipase/protease/amylase (CREON) 36000 UNITS CPEP capsule TAKE 2 CAPSULE BY MOUTH WITH MEALS AND 1 CAPSULE BY MOUTH WITH SNACKS 01/06/17  Yes [place], Laban Emperor, PA-C  nystatin-triamcinolone (MYCOLOG II) cream Apply 1 application  topically 2 (two) times daily as needed. To the affected area 07/24/14  Yes Elsie Stain, MD  oxyCODONE-acetaminophen (PERCOCET/ROXICET) 5-325 MG tablet  02/22/17  Yes [provider]  traZODone (DESYREL) 100 MG tablet Take 2 tablets (200 mg total) by mouth at bedtime. 07/18/14  Yes Elsie Stain, MD  Aspirin-Salicylamide-Caffeine [facility] HEADACHE POWDER PO) Take 1 tablet by mouth daily as  needed (headache).     [provider]  fluticasone (FLONASE) 50 MCG/ACT nasal spray USE 2 SPRAYS IN [facility] NOSTRIL ONCE DAILY Patient not taking: Reported on 03/03/2017 02/12/15   Elsie Stain, MD  ipratropium-albuterol (DUONEB) 0.5-2.5 (3) MG/3ML SOLN Take 3 mLs by nebulization 3 (three) times daily. Patient not taking: Reported on 03/03/2017 05/20/15   Javier Glazier, MD  levothyroxine Wilmer Floor, LEVOTHROID) 75 MCG tablet  01/22/17   [provider]  MetroNIDAZOLE (FLAGYL PO) Take by mouth as needed.    [provider]  mupirocin ointment (BACTROBAN) 2 %  02/22/17   [provider]  Na Sulfate-K Sulfate-Mg Sulf 17.5-3.13-1.6 GM/180ML SOLN Take 1 kit by mouth once. Patient not taking: Reported on 03/03/2017 12/25/15   [place], Janett Billow D, PA-C  pantoprazole (PROTONIX) 40 MG tablet TAKE ONE (1) TABLET BY MOUTH EVERY DAY Patient not taking: Reported on 03/03/2017 02/25/15   Elsie Stain, MD  predniSONE (DELTASONE) 10 MG tablet Take 4 tabs in am x 2 days, 3 tabs in am x 2 days, 2 tabs in am x 2 days, 1 tabs in am x 2 days then stop Patient not taking: Reported on 03/03/2017 01/23/16   Parrett, Fonnie Mu, NP  prochlorperazine (COMPAZINE) 10 MG tablet TAKE ONE (1) TABLET BY MOUTH EVERY 6 HOURS AS NEEDED FOR NAUSEA Patient not taking: Reported on 03/03/2017 03/28/14   Elsie Stain, MD  Respiratory Therapy Supplies (FLUTTER) DEVI Use as directed Patient not taking: Reported on 03/03/2017 01/23/16   Parrett, Fonnie Mu, NP  sodium chloride HYPERTONIC 3 % nebulizer solution Use 4ML two times a day Patient not taking: Reported on 03/03/2017 05/17/15   Javier Glazier, MD  triamcinolone (KENALOG) 0.025 % cream APPLY TO [place] NEEDED Patient not taking: Reported on 03/03/2017 04/09/15   Collene Gobble, MD  [facility] PROTEIN PO Take by mouth daily.    [provider]    Family History  Problem Relation Age of Onset  . Lung disease Father     . Lung cancer Father   . Lung disease Maternal Aunt   . Parkinson's disease Maternal Aunt   . COPD Maternal Aunt   . Lung disease Maternal Uncle   . COPD Maternal Uncle   . Skin cancer Mother   . Colon cancer Neg Hx   . Stomach cancer Neg Hx      Social History  Substance Use Topics  . Smoking status: Former Smoker    Packs/day: 1.00    Years: 3.00    Types: Cigarettes    Quit date: 06/08/1962  . Smokeless tobacco: Never Used  . Alcohol use No    Allergies as of 03/03/2017 - Review Complete 03/03/2017  Allergen Reaction Noted  . Sulfa antibiotics Swelling 05/03/2016  . Ambien [zolpidem tartrate]  11/12/2011  . Doxycycline  11/12/2010  . Levofloxacin Other (See Comments)   . Mometasone furoate Other (See Comments) 03/11/2010  . Penicillins Other (See Comments)   . Sulfonamide derivatives Other (See Comments)     Review of Systems:  All systems reviewed and negative except where noted in HPI.   Physical Exam:  BP 110/70 (BP Location: Right Arm, Patient Position: Sitting, Cuff Size: Normal)   Pulse 80   Temp 98.1 F (36.7 C) (Oral)   Ht 5' 3.78" (1.62 m)   Wt 83 lb (37.6 kg)   BMI 14.35 kg/m  No LMP recorded. Patient is postmenopausal.  General:   Alert,  Appears malnourished, extremely skinny, pleasant and cooperative in NAD Head:  Normocephalic and atraumatic. Bitemporal wasting Eyes:  Sclera clear, no icterus. Sunken eyeballs  Conjunctiva pink. Ears:  Hard of hearing Nose:  No deformity, discharge, or lesions. Mouth:  No deformity or lesions,oropharynx pink & moist. Neck:  Supple; no masses or thyromegaly. Lungs:  Respirations even and unlabored.  Clear throughout to auscultation.   No wheezes, crackles, or rhonchi. No acute distress. Heart:  Regular rate and rhythm; no murmurs, clicks, rubs, or gallops. Abdomen:  Normal bowel sounds.  No bruits.  Soft, non-tender and non-distended without masses, hepatosplenomegaly or hernias noted.  No guarding or rebound  tenderness.   Rectal: Nor performed Msk:  Symmetrical without gross deformities. Good, equal movement & strength bilaterally. Pulses:  Normal pulses noted. Extremities:  No clubbing or edema.  No cyanosis. Neurologic:  Alert and oriented x3;  grossly normal neurologically. Skin:  Intact without significant lesions or rashes. No jaundice. Lymph Nodes:  No significant cervical adenopathy. Psych:  Alert and cooperative. Normal mood and affect.  Imaging Studies: Reviewed  Assessment and Plan:   Brooke [name] is a 76 y.o. White female with severe exocrine pancreatic insufficiency, on Creon presents with chronic diarrhea, failure to thrive, severe protein calorie malnutrition. She has severe malabsorption secondary to severe exocrine pancreatic insufficiency. Recommend continued to take Creon 72,000 units with meals and 36,000 units with snack. Start protein shakes such as Ensure or PediaSure 2 times daily. She had stool lactoferrin positive suggesting underlying inflammation in the colon. Differentials include NSAID-induced enteropathy secondary to chronic BC powder intake versus small intestinal bacterial overgrowth or inflammatory bowel disease or microscopic colitis or other protein-losing enteropathy. Celiac serologies were negative. Should continue taking Imodium 1-2 tablets daily along with Creon  - Stop BC powder use and other NSAIDs - Recommend immunoglobulin panel, IgG 4 - Check ESR, fecal CAL protectin - Check B12, folate, vitamin D, ferritin levels - Schedule upper endoscopy and colonoscopy with biopsies  Follow up after above workup   Cephas Darby, MD

## 2017-03-04 ENCOUNTER — Other Ambulatory Visit: Payer: Self-pay

## 2017-03-04 ENCOUNTER — Other Ambulatory Visit
Admission: RE | Admit: 2017-03-04 | Discharge: 2017-03-04 | Disposition: A | Discharge status: Home / Self Care | Payer: Medicare PPO | Source: Ambulatory Visit | Attending: Emergency Medicine | Admitting: Emergency Medicine

## 2017-03-04 DIAGNOSIS — R197 Diarrhea, unspecified: Secondary | ICD-10-CM

## 2017-03-04 DIAGNOSIS — K529 Noninfective gastroenteritis and colitis, unspecified: Secondary | ICD-10-CM

## 2017-03-04 LAB — IGG, IGA, IGM
IGG (IMMUNOGLOBIN G), SERUM: 720 mg/dL (ref 700–1600)
IgA: 117 mg/dL (ref 64–422)
IgM (Immunoglobulin M), Srm: 1377 mg/dL — ABNORMAL HIGH (ref 26–217)

## 2017-03-04 LAB — VITAMIN D 25 HYDROXY (VIT D DEFICIENCY, FRACTURES): VIT D 25 HYDROXY: 31.3 ng/mL (ref 30.0–100.0)

## 2017-03-04 LAB — VITAMIN B12: VITAMIN B 12: 523 pg/mL (ref 180–914)

## 2017-03-04 LAB — HIGH SENSITIVITY CRP: CRP HIGH SENSITIVITY: 49.96 mg/L — AB (ref 0.00–3.00)

## 2017-03-08 ENCOUNTER — Other Ambulatory Visit: Payer: Self-pay | Admitting: Gastroenterology

## 2017-03-08 MED ORDER — FOLIC ACID 1 MG PO TABS: 1.0000 mg | ORAL_TABLET | Freq: Every day | ORAL | 0 refills | Status: DC

## 2017-03-09 ENCOUNTER — Telehealth: Payer: Self-pay

## 2017-03-09 LAB — CALPROTECTIN, FECAL: CALPROTECTIN, FECAL: 47 ug/g (ref 0–120)

## 2017-03-09 NOTE — Telephone Encounter (Signed)
Patient has been asked to begin taking folic acid.  daily for 1-80months   Thanks Marcelino Duster

## 2017-03-10 ENCOUNTER — Encounter: Payer: Self-pay | Admitting: *Deleted

## 2017-03-10 ENCOUNTER — Other Ambulatory Visit: Payer: Self-pay

## 2017-03-10 ENCOUNTER — Telehealth: Payer: Self-pay

## 2017-03-10 ENCOUNTER — Encounter: Payer: Self-pay | Admitting: Anesthesiology

## 2017-03-10 DIAGNOSIS — R627 Adult failure to thrive: Secondary | ICD-10-CM

## 2017-03-10 DIAGNOSIS — K529 Noninfective gastroenteritis and colitis, unspecified: Secondary | ICD-10-CM

## 2017-03-10 NOTE — Telephone Encounter (Signed)
Date change is pending because I need to contact daughter to discuss change.  LVM for her to call me.  The date that I changed it to at [facility] 03/18/17 is not available for a double.  Thanks [employer]

## 2017-03-10 NOTE — Telephone Encounter (Signed)
Colonoscopy has been rescheduled to [facility] to 03/18/17 [facility] has requested location change due to patients medical history.  Thanks [employer]

## 2017-03-11 NOTE — Telephone Encounter (Signed)
Patients daughter called, Dimas Chyle, to check on a time for her procedure. Please call at [phone]

## 2017-03-12 ENCOUNTER — Telehealth: Payer: Self-pay

## 2017-03-12 ENCOUNTER — Other Ambulatory Visit: Payer: Self-pay

## 2017-03-12 NOTE — Telephone Encounter (Signed)
Returned patients daughter Chip Boer phone call.  Left voice message explaining that after her mothers chart had been reviewed anesthesiologist thought [facility] would be the best place for her to have her Colonoscopy and EGD.  Informed her of the new date and gave phone number to [facility] to call the day before to get arrival time.  Thanks, Marcelino Duster

## 2017-03-15 ENCOUNTER — Telehealth: Payer: Self-pay | Admitting: Gastroenterology

## 2017-03-15 ENCOUNTER — Other Ambulatory Visit: Payer: Self-pay

## 2017-03-15 NOTE — Telephone Encounter (Signed)
Patient called and wants Dr. Allegra Lai to know that her stomach is bothering her and making her nausea and sick. Can we call her in something for it? [facility] in [place] Please call [phone] Call this number. It's her daughter Chip Boer

## 2017-03-16 ENCOUNTER — Emergency Department: Payer: Medicare PPO

## 2017-03-16 ENCOUNTER — Observation Stay
Admission: EM | Admit: 2017-03-16 | Discharge: 2017-03-18 | Disposition: A | Discharge status: Home / Self Care | Payer: Medicare PPO | Attending: Internal Medicine | Admitting: Internal Medicine

## 2017-03-16 ENCOUNTER — Encounter: Payer: Self-pay | Admitting: Emergency Medicine

## 2017-03-16 DIAGNOSIS — E039 Hypothyroidism, unspecified: Secondary | ICD-10-CM | POA: Insufficient documentation

## 2017-03-16 DIAGNOSIS — M1990 Unspecified osteoarthritis, unspecified site: Secondary | ICD-10-CM | POA: Insufficient documentation

## 2017-03-16 DIAGNOSIS — K861 Other chronic pancreatitis: Secondary | ICD-10-CM | POA: Insufficient documentation

## 2017-03-16 DIAGNOSIS — J449 Chronic obstructive pulmonary disease, unspecified: Secondary | ICD-10-CM | POA: Insufficient documentation

## 2017-03-16 DIAGNOSIS — Z7982 Long term (current) use of aspirin: Secondary | ICD-10-CM | POA: Insufficient documentation

## 2017-03-16 DIAGNOSIS — Z79899 Other long term (current) drug therapy: Secondary | ICD-10-CM | POA: Diagnosis not present

## 2017-03-16 DIAGNOSIS — J849 Interstitial pulmonary disease, unspecified: Secondary | ICD-10-CM | POA: Insufficient documentation

## 2017-03-16 DIAGNOSIS — Z87891 Personal history of nicotine dependence: Secondary | ICD-10-CM | POA: Diagnosis not present

## 2017-03-16 DIAGNOSIS — Z880 Allergy status to penicillin: Secondary | ICD-10-CM | POA: Insufficient documentation

## 2017-03-16 DIAGNOSIS — J9610 Chronic respiratory failure, unspecified whether with hypoxia or hypercapnia: Secondary | ICD-10-CM | POA: Diagnosis not present

## 2017-03-16 DIAGNOSIS — F329 Major depressive disorder, single episode, unspecified: Secondary | ICD-10-CM | POA: Insufficient documentation

## 2017-03-16 DIAGNOSIS — R34 Anuria and oliguria: Secondary | ICD-10-CM | POA: Insufficient documentation

## 2017-03-16 DIAGNOSIS — Z882 Allergy status to sulfonamides status: Secondary | ICD-10-CM | POA: Insufficient documentation

## 2017-03-16 DIAGNOSIS — Z66 Do not resuscitate: Secondary | ICD-10-CM | POA: Diagnosis not present

## 2017-03-16 DIAGNOSIS — Z9981 Dependence on supplemental oxygen: Secondary | ICD-10-CM | POA: Diagnosis not present

## 2017-03-16 DIAGNOSIS — Z888 Allergy status to other drugs, medicaments and biological substances status: Secondary | ICD-10-CM | POA: Insufficient documentation

## 2017-03-16 DIAGNOSIS — E876 Hypokalemia: Secondary | ICD-10-CM | POA: Insufficient documentation

## 2017-03-16 DIAGNOSIS — R197 Diarrhea, unspecified: Principal | ICD-10-CM | POA: Diagnosis present

## 2017-03-16 DIAGNOSIS — E860 Dehydration: Secondary | ICD-10-CM | POA: Insufficient documentation

## 2017-03-16 LAB — URINALYSIS, COMPLETE (UACMP) WITH MICROSCOPIC
BILIRUBIN URINE: NEGATIVE
Bacteria, UA: NONE SEEN
Glucose, UA: NEGATIVE mg/dL
Hgb urine dipstick: NEGATIVE
KETONES UR: 80 mg/dL — AB
LEUKOCYTES UA: NEGATIVE
Nitrite: NEGATIVE
PH: 6 (ref 5.0–8.0)
PROTEIN: NEGATIVE mg/dL
SQUAMOUS EPITHELIAL / LPF: NONE SEEN
Specific Gravity, Urine: >1.046 — ABNORMAL HIGH (ref 1.005–1.030)

## 2017-03-16 LAB — TROPONIN I: Troponin I: <0.03 ng/mL (ref <0.03)

## 2017-03-16 LAB — COMPREHENSIVE METABOLIC PANEL
ALK PHOS: 83 U/L (ref 38–126)
ALT: 9 U/L — ABNORMAL LOW (ref 14–54)
ANION GAP: 14 (ref 5–15)
AST: 17 U/L (ref 15–41)
Albumin: 3.4 g/dL — ABNORMAL LOW (ref 3.5–5.0)
BILIRUBIN TOTAL: 0.4 mg/dL (ref 0.3–1.2)
BUN: 13 mg/dL (ref 6–20)
CALCIUM: 8.9 mg/dL (ref 8.9–10.3)
CO2: 27 mmol/L (ref 22–32)
Chloride: 96 mmol/L — ABNORMAL LOW (ref 101–111)
Creatinine, Ser: 0.88 mg/dL (ref 0.44–1.00)
Glucose, Bld: 82 mg/dL (ref 65–99)
Potassium: 3.7 mmol/L (ref 3.5–5.1)
Sodium: 137 mmol/L (ref 135–145)
TOTAL PROTEIN: 7.5 g/dL (ref 6.5–8.1)

## 2017-03-16 LAB — CBC
HEMATOCRIT: 38.7 % (ref 35.0–47.0)
HEMOGLOBIN: 12.8 g/dL (ref 12.0–16.0)
MCH: 29.4 pg (ref 26.0–34.0)
MCHC: 33.1 g/dL (ref 32.0–36.0)
MCV: 88.7 fL (ref 80.0–100.0)
PLATELETS: 439 10*3/uL (ref 150–440)
RBC: 4.36 MIL/uL (ref 3.80–5.20)
RDW: 14.9 % — ABNORMAL HIGH (ref 11.5–14.5)
WBC: 17.3 10*3/uL — AB (ref 3.6–11.0)

## 2017-03-16 LAB — LIPASE, BLOOD: Lipase: 35 U/L (ref 11–51)

## 2017-03-16 LAB — MAGNESIUM: MAGNESIUM: 1.7 mg/dL (ref 1.7–2.4)

## 2017-03-16 LAB — LACTIC ACID, PLASMA: LACTIC ACID, VENOUS: 1.1 mmol/L (ref 0.5–1.9)

## 2017-03-16 MED ORDER — ENOXAPARIN SODIUM 40 MG/0.4ML ~~LOC~~ SOLN
40.0000 mg | SUBCUTANEOUS | Status: DC
  Administered 2017-03-16 – 2017-03-17 (×2): 40 mg via SUBCUTANEOUS
  Filled 2017-03-16 (×2): qty 0.4

## 2017-03-16 MED ORDER — ONDANSETRON HCL 4 MG/2ML IJ SOLN: 4.0000 mg | Freq: Four times a day (QID) | INTRAMUSCULAR | Status: DC | PRN

## 2017-03-16 MED ORDER — SODIUM CHLORIDE 0.9 % IV BOLUS (SEPSIS)
1000.0000 mL | Freq: Once | INTRAVENOUS | Status: AC
  Administered 2017-03-16: 1000 mL via INTRAVENOUS

## 2017-03-16 MED ORDER — SODIUM CHLORIDE 0.9 % IV BOLUS (SEPSIS)
500.0000 mL | Freq: Once | INTRAVENOUS | Status: AC
  Administered 2017-03-16: 500 mL via INTRAVENOUS

## 2017-03-16 MED ORDER — ACETAMINOPHEN 650 MG RE SUPP: 650.0000 mg | Freq: Four times a day (QID) | RECTAL | Status: DC | PRN

## 2017-03-16 MED ORDER — PANCRELIPASE (LIP-PROT-AMYL) 12000-38000 UNITS PO CPEP
72000.0000 [IU] | ORAL_CAPSULE | Freq: Three times a day (TID) | ORAL | Status: DC
  Administered 2017-03-16: 36000 [IU] via ORAL
  Administered 2017-03-17 – 2017-03-18 (×5): 72000 [IU] via ORAL
  Filled 2017-03-16: qty 2
  Filled 2017-03-16: qty 6
  Filled 2017-03-16: qty 2
  Filled 2017-03-16 (×2): qty 6
  Filled 2017-03-16 (×4): qty 2

## 2017-03-16 MED ORDER — SODIUM CHLORIDE 0.9 % IV SOLN
INTRAVENOUS | Status: DC
  Administered 2017-03-16 – 2017-03-17 (×2): via INTRAVENOUS

## 2017-03-16 MED ORDER — ALBUTEROL SULFATE (2.5 MG/3ML) 0.083% IN NEBU
2.5000 mg | INHALATION_SOLUTION | RESPIRATORY_TRACT | Status: DC | PRN
  Administered 2017-03-17: 2.5 mg via RESPIRATORY_TRACT
  Filled 2017-03-16: qty 3

## 2017-03-16 MED ORDER — ORAL CARE MOUTH RINSE
15.0000 mL | Freq: Two times a day (BID) | OROMUCOSAL | Status: DC
  Administered 2017-03-17: 15 mL via OROMUCOSAL

## 2017-03-16 MED ORDER — LEVOTHYROXINE SODIUM 50 MCG PO TABS
75.0000 ug | ORAL_TABLET | Freq: Every day | ORAL | Status: DC
  Administered 2017-03-17 – 2017-03-18 (×2): 75 ug via ORAL
  Filled 2017-03-16 (×3): qty 1

## 2017-03-16 MED ORDER — IOPAMIDOL (ISOVUE-300) INJECTION 61%
75.0000 mL | Freq: Once | INTRAVENOUS | Status: AC | PRN
  Administered 2017-03-16: 75 mL via INTRAVENOUS

## 2017-03-16 MED ORDER — ACETAMINOPHEN 325 MG PO TABS: 650.0000 mg | ORAL_TABLET | Freq: Four times a day (QID) | ORAL | Status: DC | PRN

## 2017-03-16 MED ORDER — FOLIC ACID 1 MG PO TABS
1.0000 mg | ORAL_TABLET | Freq: Every day | ORAL | Status: DC
  Administered 2017-03-17 – 2017-03-18 (×2): 1 mg via ORAL
  Filled 2017-03-16 (×2): qty 1

## 2017-03-16 MED ORDER — HYDROCODONE-ACETAMINOPHEN 5-325 MG PO TABS
1.0000 | ORAL_TABLET | ORAL | Status: DC | PRN
  Administered 2017-03-16 – 2017-03-18 (×6): 1 via ORAL
  Filled 2017-03-16 (×6): qty 1

## 2017-03-16 MED ORDER — SODIUM CHLORIDE 0.9 % IV SOLN
Freq: Once | INTRAVENOUS | Status: AC
  Administered 2017-03-16: 19:00:00 via INTRAVENOUS

## 2017-03-16 MED ORDER — ONDANSETRON HCL 4 MG PO TABS: 4.0000 mg | ORAL_TABLET | Freq: Four times a day (QID) | ORAL | Status: DC | PRN

## 2017-03-16 MED ORDER — TRAZODONE HCL 100 MG PO TABS
200.0000 mg | ORAL_TABLET | Freq: Every day | ORAL | Status: DC
  Administered 2017-03-16 – 2017-03-17 (×2): 200 mg via ORAL
  Filled 2017-03-16 (×2): qty 2

## 2017-03-16 MED ORDER — CITALOPRAM HYDROBROMIDE 20 MG PO TABS
40.0000 mg | ORAL_TABLET | Freq: Every day | ORAL | Status: DC
  Administered 2017-03-17 – 2017-03-18 (×2): 40 mg via ORAL
  Filled 2017-03-16 (×2): qty 2

## 2017-03-16 NOTE — ED Notes (Signed)
Pt made aware that we need a urine and stool specimen whenever she is able to go.

## 2017-03-16 NOTE — H&P (Signed)
[facility] [place] NAME: Brooke [name]    MR#:  [id number]  DATE OF BIRTH:  [birth year]  DATE OF ADMISSION:  03/16/2017  PRIMARY CARE PHYSICIAN: Sandi Mariscal, MD   REQUESTING/REFERRING PHYSICIAN: Merlyn Lot, MD  CHIEF COMPLAINT:   Chief Complaint  Patient presents with  . Diarrhea   Worsening generalized weakness and diarrhea. HISTORY OF PRESENT ILLNESS:  Brooke [name]  is a 76 y.o. female with a known history of Chronic respiratory failure on home oxygen 4 L, end stage COPD, chronic diarrhea and pancreatic insufficiency. The patient to present to the ED with the above chief complaints. She also complains of dizziness and the epigastric pain and decreased oral intake for 3 days. She denies any fever or chills, no dysuria or urgency. She was found leukocytosis but the urinalysis and chest x-ray are unremarkable. ED physician, Dr. Quentin Cornwall requested observation. PAST MEDICAL HISTORY:   Past Medical History:  Diagnosis Date  . Anemia   . Arthritis   . Bronchiectasis    colonized mrsa, s/p vancomyin IV 10 days 5/10 and 7 days 6/10  . Dyspnea   . Headache(784.0)   . History of allergy   . Hypothyroidism   . Mycobacterium avium complex ([place])   . Pneumonia   . Wears dentures    Has partial upper.  does not wear    PAST SURGICAL HISTORY:   Past Surgical History:  Procedure Laterality Date  . ABDOMINAL HYSTERECTOMY    . CATARACT EXTRACTION, BILATERAL    . TONSILLECTOMY     t and a    SOCIAL HISTORY:   Social History  Substance Use Topics  . Smoking status: Former Smoker    Packs/day: 1.00    Years: 3.00    Types: Cigarettes    Quit date: 06/08/1962  . Smokeless tobacco: Never Used     Comment: Most of family "vapes" in house  . Alcohol use No    FAMILY HISTORY:   Family History  Problem Relation Age of Onset  . Lung disease Father   . Lung cancer Father   . Lung disease Maternal Aunt   . Parkinson's disease Maternal  Aunt   . COPD Maternal Aunt   . Lung disease Maternal Uncle   . COPD Maternal Uncle   . Skin cancer Mother   . Colon cancer Neg Hx   . Stomach cancer Neg Hx     DRUG ALLERGIES:   Allergies  Allergen Reactions  . Sulfa Antibiotics Swelling  . Ambien [Zolpidem Tartrate]     Sleep walking and altered mental status  . Doxycycline     vomiting  . Levofloxacin Other (See Comments)    Unknown rxn.  Tolerates Cipro  . Mometasone Furoate Other (See Comments)     nasonex causes swollen throat, thrush  . Penicillins Other (See Comments)    Unknown reaction noted on hospice paperwork  . Sulfonamide Derivatives Other (See Comments)    Unknown reaction noted on hospice paperwork    REVIEW OF SYSTEMS:   Review of Systems  Constitutional: Positive for malaise/fatigue and weight loss. Negative for chills and fever.  HENT: Positive for ear pain. Negative for hearing loss and sore throat.   Eyes: Negative for blurred vision and double vision.  Respiratory: Negative for cough, hemoptysis, shortness of breath, wheezing and stridor.   Cardiovascular: Negative for chest pain, palpitations, orthopnea and leg swelling.  Gastrointestinal: Positive for abdominal pain and diarrhea. Negative for  blood in stool, melena, nausea and vomiting.  Genitourinary: Negative for dysuria, flank pain and hematuria.  Musculoskeletal: Negative for back pain and joint pain.  Neurological: Positive for weakness. Negative for dizziness, sensory change, focal weakness, seizures, loss of consciousness and headaches.  Endo/Heme/Allergies: Negative for polydipsia.  Psychiatric/Behavioral: Negative for depression. The patient is not nervous/anxious.     MEDICATIONS AT HOME:   Prior to Admission medications   Medication Sig Start Date End Date Taking? Authorizing Provider  albuterol (PROVENTIL) (2.5 MG/3ML) 0.083% nebulizer solution Take 3 mLs (2.5 mg total) by nebulization every 6 (six) hours as needed (respiratory  distress). 07/18/14  Yes Elsie Stain, MD  citalopram (CELEXA) 40 MG tablet TAKE ONE (1) TABLET BY MOUTH EVERY DAY 06/12/14  Yes Elsie Stain, MD  folic acid (FOLVITE) 1 MG tablet Take 1 tablet (1 mg total) by mouth daily. 03/08/17 05/07/17 Yes Vanga, Tally Due, MD  albuterol (VENTOLIN HFA) 108 (90 BASE) MCG/ACT inhaler Inhale 2 puffs into the lungs every 6 (six) hours as needed. 09/11/14   Elsie Stain, MD  Aspirin-Salicylamide-Caffeine (BC HEADACHE POWDER PO) Take 1 tablet by mouth daily as needed (headache).     [provider]  fluticasone (FLONASE) 50 MCG/ACT nasal spray USE 2 SPRAYS IN [facility] NOSTRIL ONCE DAILY Patient not taking: Reported on 03/03/2017 02/12/15   Elsie Stain, MD  ipratropium-albuterol (DUONEB) 0.5-2.5 (3) MG/3ML SOLN Take 3 mLs by nebulization 3 (three) times daily. Patient not taking: Reported on 03/03/2017 05/20/15   Javier Glazier, MD  levothyroxine (SYNTHROID, LEVOTHROID) 100 MCG tablet TAKE ONE-HALF TABLET BY MOUTH EVERY MORNING BEFORE BREAKFAST 12/04/14   Elsie Stain, MD  levothyroxine (SYNTHROID, LEVOTHROID) 75 MCG tablet  01/22/17   [provider]  lipase/protease/amylase (CREON) 36000 UNITS CPEP capsule TAKE 2 CAPSULE BY MOUTH WITH MEALS AND 1 CAPSULE BY MOUTH WITH SNACKS 01/06/17   [place], Janett Billow D, PA-C  MetroNIDAZOLE (FLAGYL PO) Take by mouth as needed.    [provider]  mupirocin ointment (BACTROBAN) 2 %  02/22/17   [provider]  Na Sulfate-K Sulfate-Mg Sulf 17.5-3.13-1.6 GM/180ML SOLN Take 1 kit by mouth once. Patient not taking: Reported on 03/03/2017 12/25/15   [place], Laban Emperor, PA-C  nystatin-triamcinolone (MYCOLOG II) cream Apply 1 application topically 2 (two) times daily as needed. To the affected area 07/24/14   Elsie Stain, MD  oxyCODONE-acetaminophen (PERCOCET/ROXICET) 5-325 MG tablet  02/22/17   [provider]  pantoprazole (PROTONIX) 40 MG tablet TAKE ONE (1) TABLET BY MOUTH EVERY  DAY Patient not taking: Reported on 03/03/2017 02/25/15   Elsie Stain, MD  predniSONE (DELTASONE) 10 MG tablet Take 4 tabs in am x 2 days, 3 tabs in am x 2 days, 2 tabs in am x 2 days, 1 tabs in am x 2 days then stop Patient not taking: Reported on 03/03/2017 01/23/16   Parrett, Fonnie Mu, NP  prochlorperazine (COMPAZINE) 10 MG tablet TAKE ONE (1) TABLET BY MOUTH EVERY 6 HOURS AS NEEDED FOR NAUSEA Patient not taking: Reported on 03/03/2017 03/28/14   Elsie Stain, MD  Respiratory Therapy Supplies (FLUTTER) DEVI Use as directed Patient not taking: Reported on 03/03/2017 01/23/16   Parrett, Fonnie Mu, NP  sodium chloride HYPERTONIC 3 % nebulizer solution Use 4ML two times a day Patient not taking: Reported on 03/03/2017 05/17/15   Javier Glazier, MD  traZODone (DESYREL) 100 MG tablet Take 2 tablets (200 mg total) by mouth at bedtime. 07/18/14  Elsie Stain, MD  triamcinolone (KENALOG) 0.025 % cream APPLY TO AFFECTED AREA THREE TIMES DAILYAS NEEDED Patient not taking: Reported on 03/03/2017 04/09/15   Collene Gobble, MD  [facility] PROTEIN PO Take by mouth daily.    [provider]      VITAL SIGNS:  Blood pressure (!) 107/51, pulse 85, temperature 97.7 F (36.5 C), temperature source Oral, resp. rate 18, height _0  (1.626 m), weight 83 lb (37.6 kg), SpO2 99 %.  PHYSICAL EXAMINATION:  Physical Exam  GENERAL:  76 y.o.-year-old patient lying in the bed with no acute distress. Severe malnutrition. EYES: Pupils equal, round, reactive to light and accommodation. No scleral icterus. Extraocular muscles intact.  HEENT: Head atraumatic, normocephalic. Oropharynx and nasopharynx clear.  NECK:  Supple, no jugular venous distention. No thyroid enlargement, no tenderness.  LUNGS: Normal breath sounds bilaterally, no wheezing, rales,rhonchi or crepitation. No use of accessory muscles of respiration.  CARDIOVASCULAR: S1, S2 normal. No murmurs, rubs, or gallops.  ABDOMEN: Soft, nontender,  nondistended. Bowel sounds present. No organomegaly or mass.  EXTREMITIES: No pedal edema, cyanosis, or clubbing.  NEUROLOGIC: Cranial nerves II through XII are intact. Muscle strength 3-4/5 in all extremities. Sensation intact. Gait not checked.  PSYCHIATRIC: The patient is alert and oriented x 3.  SKIN: No obvious rash, lesion, or ulcer.   LABORATORY PANEL:   CBC  Recent Labs Lab 03/16/17 1159  WBC 17.3*  HGB 12.8  HCT 38.7  PLT 439   ------------------------------------------------------------------------------------------------------------------  Chemistries   Recent Labs Lab 03/16/17 1159  NA 137  K 3.7  CL 96*  CO2 27  GLUCOSE 82  BUN 13  CREATININE 0.88  CALCIUM 8.9  AST 17  ALT 9*  ALKPHOS 83  BILITOT 0.4   ------------------------------------------------------------------------------------------------------------------  Cardiac Enzymes  Recent Labs Lab 03/16/17 1202  TROPONINI <0.03   ------------------------------------------------------------------------------------------------------------------  RADIOLOGY:  Dg Chest 2 View  Result Date: 03/16/2017 CLINICAL DATA:  Diarrhea. EXAM: CHEST  2 VIEW COMPARISON:  12/22/2015.  08/16/2014. FINDINGS: Mediastinum hilar structures are normal. COPD again noted . Chronic interstitial changes noted consistent chronic interstitial lung disease. Stable calcified nodular density left lung base most likely granuloma. Stable bilateral pleural thickening consistent with scarring. Stable cardiomegaly, no pulmonary venous congestion. IMPRESSION: 1. COPD and chronic interstitial lung disease. Chronic pleural-parenchymal scarring. 2. Stable cardiomegaly.  No pulmonary venous congestion. Electronically Signed   By: Marcello Moores  Register   On: 03/16/2017 14:45   Ct Abdomen Pelvis W Contrast  Result Date: 03/16/2017 CLINICAL DATA:  Intermittent diarrhea for the past year. EXAM: CT ABDOMEN AND PELVIS WITH CONTRAST TECHNIQUE:  Multidetector CT imaging of the abdomen and pelvis was performed using the standard protocol following bolus administration of intravenous contrast. CONTRAST:  64m ISOVUE-300 IOPAMIDOL (ISOVUE-300) INJECTION 61% COMPARISON:  CT scan 02/28/2017 FINDINGS: Lower chest: Chronic bibasilar scarring changes, tree-in-bud appearance and bronchiectasis. Stable calcified granuloma in the lingula. The heart is normal in size. No pericardial effusion. The distal esophagus is grossly normal. Hepatobiliary: Stable numerous calcified granulomas. Stable intra and extrahepatic biliary dilatation. The gallbladder is unremarkable. No common bile duct stones are identified. Pancreas: Stable severe pancreatic atrophy but no mass, inflammation or significant ductal dilatation. Spleen: Normal size.  Numerous calcified granulomas. Adrenals/Urinary Tract: The adrenal glands and kidneys are unremarkable and stable. Stable benign right renal cyst. Stomach/Bowel: The stomach, duodenum, small bowel and colon are unremarkable. No acute inflammatory changes, mass lesions or obstructive findings. Vascular/Lymphatic: Stable advanced atherosclerotic calcifications involving the aorta. No aneurysm  or dissection. The branch vessels are patent. The major venous structures are patent. No mesenteric or retroperitoneal mass or adenopathy. Reproductive: Surgically absent. Other: No pelvic mass or adenopathy. No free pelvic fluid collections. No inguinal mass or adenopathy. No abdominal wall hernia or subcutaneous lesions. Musculoskeletal: No significant bony findings. IMPRESSION: 1. Chronic basilar scarring changes with bronchiectasis, chronic tree-in-bud appearance hand small airspace nodules suggesting inflammation or atypical infection such as MAC. 2. Chronic intra and extrahepatic biliary dilatation without obvious cause. Possible chronic distal stricture. 3. Stable changes of remote granulomatous disease. 4. No acute abdominal/pelvic findings, mass  lesions or lymphadenopathy. Electronically Signed   By: Marijo Sanes M.D.   On: 03/16/2017 14:46      IMPRESSION AND PLAN:  Brooke [name]  is a 76 y.o. female with a known history of Chronic respiratory failure on home oxygen 4 L, end stage COPD, chronic diarrhea and pancreatic insufficiency. She came to the ED due to generalized weakness and worsening diarrhea. The patient will be placed for observation.  Generalized weakness PT evaluation. Fall precaution.  Chronic diarrhea. Follow-up GI panel and C. difficile test. IV fluid support. Leukocytosis. Follow-up CBC. Pancreatic insufficiency. Continue Creon. Severe malnutrition. Dietitian consult. COPD and chronic respiratory failure. Continue home oxygen 4 L, NEB when necessary.  All the records are reviewed and case discussed with ED provider. Management plans discussed with the patient, family and they are in agreement.  CODE STATUS: DO NOT RESUSCITATE  TOTAL TIME TAKING CARE OF THIS PATIENT: 50 minutes.    Demetrios Loll M.D on 03/16/2017 at 7:34 PM  Between 7am to 6pm - Pager - [phone]  After 6pm go to [email or web address] - Proofreader  [facility]  Office  [phone]  CC: Primary care physician; Sandi Mariscal, MD   Note: This dictation was prepared with Dragon dictation along with smaller phrase technology. Any transcriptional errors that result from this process are unin

## 2017-03-16 NOTE — ED Notes (Signed)
Patient transported to CT 

## 2017-03-16 NOTE — ED Notes (Signed)
Attempted urine specimen. Unable to go.

## 2017-03-16 NOTE — ED Provider Notes (Signed)
[facility] Emergency Department Provider Note    First MD Initiated Contact with Patient 03/16/17 1235     (approximate)  I have reviewed the triage vital signs and the nursing notes.   HISTORY  Chief Complaint Diarrhea    HPI Brooke [name] is a 76 y.o. 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 No recent antibiotics. No recent steroids. No new cough or fevers. States the pain is burning and aching sensation. Does endorse increased urinary frequency.   Past Medical History:  Diagnosis Date  . Anemia   . Arthritis   . Bronchiectasis    colonized mrsa, s/p vancomyin IV 10 days 5/10 and 7 days 6/10  . Dyspnea   . Headache(784.0)   . History of allergy   . Hypothyroidism   . Mycobacterium avium complex ([place])   . Pneumonia   . Wears dentures    Has partial upper.  does not wear   Family History  Problem Relation Age of Onset  . Lung disease Father   . Lung cancer Father   . Lung disease Maternal Aunt   . Parkinson's disease Maternal Aunt   . COPD Maternal Aunt   . Lung disease Maternal Uncle   . COPD Maternal Uncle   . Skin cancer Mother   . Colon cancer Neg Hx   . Stomach cancer Neg Hx    Past Surgical History:  Procedure Laterality Date  . ABDOMINAL HYSTERECTOMY    . CATARACT EXTRACTION, BILATERAL    . TONSILLECTOMY     t and a   Patient Active Problem List   Diagnosis Date Noted  . Chronic diarrhea 01/06/2017  . Pancreatic insufficiency 01/06/2017  . Chronic pain syndrome 01/02/2015  . Muscular deconditioning 08/18/2014  . Weakness of both legs 08/16/2014  . Sacral decubitus ulcer 08/16/2014  . DNR (do not resuscitate) 07/20/2014  . Encounter for  palliative care 07/06/2013  . Chronic respiratory failure ([place]) 10/05/2012  . Gout 09/24/2011  . Severe protein-calorie malnutrition ([place]) 09/24/2011  . Diarrhea 09/24/2011  . Anemia 09/24/2011  . 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 HYPOTHYROIDISM 08/19/2007  . BRONCHIECTASIS 08/19/2007  . ALLERGY, HX OF 08/19/2007      Prior to Admission medications   Medication Sig Start Date End Date Taking? Authorizing Provider  albuterol (PROVENTIL) (2.5 MG/3ML) 0.083% nebulizer solution Take 3 mLs (2.5 mg total) by nebulization every 6 (six) hours as needed (respiratory distress). 07/18/14   Elsie Stain, MD  albuterol (VENTOLIN HFA) 108 (90 BASE) MCG/ACT inhaler Inhale 2 puffs into the lungs every 6 (six) hours as needed. 09/11/14   Elsie Stain, MD  Aspirin-Salicylamide-Caffeine (BC HEADACHE POWDER PO) Take 1 tablet by mouth daily as needed (headache).     [provider]  citalopram (CELEXA) 40 MG tablet TAKE ONE (1) TABLET BY MOUTH EVERY DAY 06/12/14   Elsie Stain, MD  fluticasone [facility]) 50 MCG/ACT nasal spray USE 2 SPRAYS IN [facility] NOSTRIL ONCE DAILY Patient not taking: Reported on 03/03/2017 02/12/15   Elsie Stain, MD  folic acid (FOLVITE) 1 MG tablet Take 1 tablet (1 mg total) by mouth daily. 03/08/17 05/07/17  Lin Landsman, MD  ipratropium-albuterol (DUONEB)  0.5-2.5 (3) MG/3ML SOLN Take 3 mLs by nebulization 3 (three) times daily. Patient not taking: Reported on 03/03/2017 05/20/15   Javier Glazier, MD  levothyroxine (SYNTHROID, LEVOTHROID) 100 MCG tablet TAKE ONE-HALF TABLET BY MOUTH EVERY MORNING BEFORE BREAKFAST 12/04/14   Elsie Stain, MD  levothyroxine (SYNTHROID, LEVOTHROID) 75 MCG tablet  01/22/17   [provider]  lipase/protease/amylase (CREON) 36000 UNITS CPEP capsule TAKE 2 CAPSULE BY MOUTH WITH MEALS AND 1 CAPSULE BY MOUTH WITH SNACKS 01/06/17   Zehr, Janett Billow D, PA-C  MetroNIDAZOLE (FLAGYL PO) Take by mouth as  needed.    [provider]  mupirocin ointment (BACTROBAN) 2 %  02/22/17   [provider]  Na Sulfate-K Sulfate-Mg Sulf 17.5-3.13-1.6 GM/180ML SOLN Take 1 kit by mouth once. Patient not taking: Reported on 03/03/2017 12/25/15   Zehr, Laban Emperor, PA-C  nystatin-triamcinolone (MYCOLOG II) cream Apply 1 application topically 2 (two) times daily as needed. To the affected area 07/24/14   Elsie Stain, MD  oxyCODONE-acetaminophen (PERCOCET/ROXICET) 5-325 MG tablet  02/22/17   [provider]  pantoprazole (PROTONIX) 40 MG tablet TAKE ONE (1) TABLET BY MOUTH EVERY DAY Patient not taking: Reported on 03/03/2017 02/25/15   Elsie Stain, MD  predniSONE (DELTASONE) 10 MG tablet Take 4 tabs in am x 2 days, 3 tabs in am x 2 days, 2 tabs in am x 2 days, 1 tabs in am x 2 days then stop Patient not taking: Reported on 03/03/2017 01/23/16   Parrett, Fonnie Mu, NP  prochlorperazine (COMPAZINE) 10 MG tablet TAKE ONE (1) TABLET BY MOUTH EVERY 6 HOURS AS NEEDED FOR NAUSEA Patient not taking: Reported on 03/03/2017 03/28/14   Elsie Stain, MD  Respiratory Therapy Supplies (FLUTTER) DEVI Use as directed Patient not taking: Reported on 03/03/2017 01/23/16   Parrett, Fonnie Mu, NP  sodium chloride HYPERTONIC 3 % nebulizer solution Use 4ML two times a day Patient not taking: Reported on 03/03/2017 05/17/15   Javier Glazier, MD  traZODone (DESYREL) 100 MG tablet Take 2 tablets (200 mg total) by mouth at bedtime. 07/18/14   Elsie Stain, MD  triamcinolone (KENALOG) 0.025 % cream APPLY TO AFFECTED AREA THREE TIMES DAILYAS NEEDED Patient not taking: Reported on 03/03/2017 04/09/15   Collene Gobble, MD  [facility] PROTEIN PO Take by mouth daily.    [provider]    Allergies Sulfa antibiotics; Ambien [zolpidem tartrate]; Doxycycline; Levofloxacin; Mometasone furoate; Penicillins; and Sulfonamide derivatives    Social History Social History  Substance Use Topics  . Smoking status:  Former Smoker    Packs/day: 1.00    Years: 3.00    Types: Cigarettes    Quit date: 06/08/1962  . Smokeless tobacco: Never Used     Comment: Most of family "vapes" in house  . Alcohol use No    Review of Systems Patient denies headaches, rhinorrhea, blurry vision, numbness, shortness of breath, chest pain, edema, cough, abdominal pain, nausea, vomiting, diarrhea, dysuria, fevers, rashes or hallucinations unless otherwise stated above in HPI. ____________________________________________   PHYSICAL EXAM:  VITAL SIGNS: Vitals:   03/16/17 1157 03/16/17 1201  BP: (!) 94/49   Pulse: 95   Resp: (!) 24   Temp: 97.7 F (36.5 C)   SpO2: 95% 95%    Constitutional: Alert and oriented. Frail and cachectic appearing. Eyes: Conjunctivae are normal.  Head: Atraumatic. Nose: No congestion/rhinnorhea. Mouth/Throat: Mucous membranes are moist.   Neck: No stridor. Painless ROM.  Cardiovascular: Normal rate, regular  rhythm. Grossly normal heart sounds.  Good peripheral circulation. Respiratory: tachypnea, pursed lip breathing, no cyanosis, coarse breathsounds throughout Gastrointestinal: Soft and scaphoid with  RUQ and epigastric ttp.. No distention. No abdominal bruits. No CVA tenderness. Musculoskeletal: No lower extremity tenderness nor edema.  No joint effusions. Neurologic:  Normal speech and language. No gross focal neurologic deficits are appreciated. No facial droop Skin:  Skin is warm, dry and intact. No rash noted. Psychiatric: Mood and affect are normal. Speech and behavior are normal. ____________________________________________   LABS (all labs ordered are listed, but only abnormal results are displayed)  Results for orders placed or performed during the hospital encounter of 03/16/17 (from the past 24 hour(s))  Lipase, blood     Status: None   Collection Time: 03/16/17 11:59 AM  Result Value Ref Range   Lipase 35 11 - 51 U/L  Comprehensive metabolic panel     Status: Abnormal    Collection Time: 03/16/17 11:59 AM  Result Value Ref Range   Sodium 137 135 - 145 mmol/L   Potassium 3.7 3.5 - 5.1 mmol/L   Chloride 96 (L) 101 - 111 mmol/L   CO2 27 22 - 32 mmol/L   Glucose, Bld 82 65 - 99 mg/dL   BUN 13 6 - 20 mg/dL   Creatinine, Ser 0.88 0.44 - 1.00 mg/dL   Calcium 8.9 8.9 - 10.3 mg/dL   Total Protein 7.5 6.5 - 8.1 g/dL   Albumin 3.4 (L) 3.5 - 5.0 g/dL   AST 17 15 - 41 U/L   ALT 9 (L) 14 - 54 U/L   Alkaline Phosphatase 83 38 - 126 U/L   Total Bilirubin 0.4 0.3 - 1.2 mg/dL   GFR calc non Af Amer >60 >60 mL/min   GFR calc Af Amer >60 >60 mL/min   Anion gap 14 5 - 15  CBC     Status: Abnormal   Collection Time: 03/16/17 11:59 AM  Result Value Ref Range   WBC 17.3 (H) 3.6 - 11.0 K/uL   RBC 4.36 3.80 - 5.20 MIL/uL   Hemoglobin 12.8 12.0 - 16.0 g/dL   HCT 38.7 35.0 - 47.0 %   MCV 88.7 80.0 - 100.0 fL   MCH 29.4 26.0 - 34.0 pg   MCHC 33.1 32.0 - 36.0 g/dL   RDW 14.9 (H) 11.5 - 14.5 %   Platelets 439 150 - 440 K/uL   ____________________________________________  EKG My review and personal interpretation at Time: 12:39   Indication: diarrhea  Rate: 80  Rhythm: sinus Axis: left Other: normal intervals, no stemi, non specific st changes ____________________________________________  RADIOLOGY  I personally reviewed all radiographic images ordered to evaluate for the above acute complaints and reviewed radiology reports and findings.  These findings were personally discussed with the patient.  Please see medical record for radiology report.  ____________________________________________   PROCEDURES  Procedure(s) performed:  Procedures    Critical Care performed: no ____________________________________________   INITIAL IMPRESSION / ASSESSMENT AND PLAN / ED COURSE  Pertinent labs & imaging results that were available during my care of the patient were reviewed by me and considered in my medical decision making (see chart for details).  DDX:  sepsis, gastritis, colitis, diverticulitis, failure to thrive, uti, pna, dehydration  Brooke [name] is a 75 y.o. who presents to the ED with Diarrhea intermittently for the past several days and feeling of generalized weakness. Patient is cachectic and chronically ill appearing. No acute respiratory distress. Does have some mild  discomfort on abdominal exam therefore will order CT to evaluate for the above differential. We'll provide IV fluids as she does appear markedly dehydrated.  The patient will be placed on continuous pulse oximetry and telemetry for monitoring.  Laboratory evaluation will be sent to evaluate for the above complaints.     Clinical Course as of Mar 16 2034  Tue Mar 16, 2017  1526 Patient improving with IV fluids. Does have mild leukocytosis but no metabolic acidosis. Troponin is negative. CT imaging shows no acute process. Patient with a normal lactate and normal lipase. Still waiting stool sample.  [PR]  1909 Urinalysis with significantly elevated specific gravity. Does have 80 ketones suggesting significant component of malnutrition and probable starvation ketosis. I do not identify any evidence of infectious process. Still awaiting stool but it does appear the patient is significantly oliguric despite normal renal function.  [PR]  70 Spoke with the hospitalist service who currently agrees to admit her for further IV hydration and reassessment.  [PR]    Clinical Course User Index [PR] Merlyn Lot, MD     ____________________________________________   FINAL CLINICAL IMPRESSION(S) / ED DIAGNOSES  Final diagnoses:  Dehydration  Diarrhea, unspecified type  Oliguria      NEW MEDICATIONS STARTED DURING THIS VISIT:  New Prescriptions   No medications on file     Note:  This document was prepared using Dragon voice recognition software and may include unintentional dictation errors.    Merlyn Lot, MD 03/16/17 2037

## 2017-03-16 NOTE — ED Triage Notes (Signed)
Pt reports has had diarrhea for the past year intermittently. Pt reports this time she has had it for the past 3 days. Pt in by EMS. Pt appears pale, EMS reports it is patients baseline. Pt also on 4L [place] for COPD. Pt denies pain states she can't control the diarrhea.

## 2017-03-17 ENCOUNTER — Ambulatory Visit: Admit: 2017-03-17 | Payer: Medicare PPO | Admitting: Gastroenterology

## 2017-03-17 HISTORY — DX: Dyspnea, unspecified: R06.00

## 2017-03-17 HISTORY — DX: Presence of dental prosthetic device (complete) (partial): Z97.2

## 2017-03-17 HISTORY — DX: Unspecified osteoarthritis, unspecified site: M19.90

## 2017-03-17 HISTORY — DX: Anemia, unspecified: D64.9

## 2017-03-17 LAB — GASTROINTESTINAL PANEL BY PCR, STOOL (REPLACES STOOL CULTURE)

## 2017-03-17 LAB — CBC
HCT: 32.9 % — ABNORMAL LOW (ref 35.0–47.0)
Hemoglobin: 10.8 g/dL — ABNORMAL LOW (ref 12.0–16.0)
MCH: 28.9 pg (ref 26.0–34.0)
MCHC: 32.9 g/dL (ref 32.0–36.0)
MCV: 87.7 fL (ref 80.0–100.0)
PLATELETS: 374 10*3/uL (ref 150–440)
RBC: 3.75 MIL/uL — ABNORMAL LOW (ref 3.80–5.20)
RDW: 15.1 % — AB (ref 11.5–14.5)
WBC: 10.6 10*3/uL (ref 3.6–11.0)

## 2017-03-17 LAB — C DIFFICILE QUICK SCREEN W PCR REFLEX
C DIFFICILE (CDIFF) INTERP: NOT DETECTED
C DIFFICILE (CDIFF) TOXIN: NEGATIVE
C Diff antigen: NEGATIVE

## 2017-03-17 LAB — BASIC METABOLIC PANEL
Anion gap: 5 (ref 5–15)
BUN: 11 mg/dL (ref 6–20)
CO2: 27 mmol/L (ref 22–32)
CREATININE: 0.74 mg/dL (ref 0.44–1.00)
Calcium: 7.7 mg/dL — ABNORMAL LOW (ref 8.9–10.3)
Chloride: 106 mmol/L (ref 101–111)
GFR calc Af Amer: >60 mL/min (ref >60)
Glucose, Bld: 86 mg/dL (ref 65–99)
POTASSIUM: 3.3 mmol/L — AB (ref 3.5–5.1)
SODIUM: 138 mmol/L (ref 135–145)

## 2017-03-17 LAB — MRSA PCR SCREENING: MRSA BY PCR: NEGATIVE

## 2017-03-17 SURGERY — EGD (ESOPHAGOGASTRODUODENOSCOPY)
Anesthesia: Choice

## 2017-03-17 MED ORDER — DIPHENOXYLATE-ATROPINE 2.5-0.025 MG PO TABS
1.0000 | ORAL_TABLET | Freq: Four times a day (QID) | ORAL | Status: DC
  Administered 2017-03-17 – 2017-03-18 (×4): 1 via ORAL
  Filled 2017-03-17 (×4): qty 1

## 2017-03-17 MED ORDER — POTASSIUM CHLORIDE CRYS ER 20 MEQ PO TBCR
40.0000 meq | EXTENDED_RELEASE_TABLET | Freq: Once | ORAL | Status: AC
  Administered 2017-03-17: 40 meq via ORAL
  Filled 2017-03-17: qty 2

## 2017-03-17 MED ORDER — ADULT MULTIVITAMIN W/MINERALS CH
1.0000 | ORAL_TABLET | Freq: Every day | ORAL | Status: DC
  Administered 2017-03-17 – 2017-03-18 (×2): 1 via ORAL
  Filled 2017-03-17 (×2): qty 1

## 2017-03-17 MED ORDER — HYDROCOD POLST-CPM POLST ER 10-8 MG/5ML PO SUER
5.0000 mL | Freq: Two times a day (BID) | ORAL | Status: DC
  Administered 2017-03-17 – 2017-03-18 (×3): 5 mL via ORAL
  Filled 2017-03-17 (×4): qty 5

## 2017-03-17 MED ORDER — ENSURE ENLIVE PO LIQD
237.0000 mL | Freq: Two times a day (BID) | ORAL | Status: DC
  Administered 2017-03-17 – 2017-03-18 (×2): 237 mL via ORAL

## 2017-03-17 MED ORDER — AZITHROMYCIN 250 MG PO TABS
250.0000 mg | ORAL_TABLET | Freq: Every day | ORAL | Status: DC
  Administered 2017-03-18: 250 mg via ORAL
  Filled 2017-03-17: qty 1

## 2017-03-17 MED ORDER — AZITHROMYCIN 500 MG PO TABS
500.0000 mg | ORAL_TABLET | Freq: Every day | ORAL | Status: AC
  Administered 2017-03-17: 500 mg via ORAL
  Filled 2017-03-17: qty 1

## 2017-03-17 MED ORDER — MORPHINE SULFATE (CONCENTRATE) 10 MG/0.5ML PO SOLN
10.0000 mg | ORAL | Status: DC | PRN
  Administered 2017-03-17: 10 mg via ORAL
  Filled 2017-03-17: qty 1

## 2017-03-17 NOTE — Evaluation (Signed)
Physical Therapy Evaluation Patient Details Name: Brooke [name] MRN: [id number] DOB: 10-28-1940 Today's Date: 03/17/2017   History of Present Illness  Pt is a 76 y.o.femalewith a known history of chronic respiratory failure on home oxygen 4 L, endstage COPD, chronic diarrhea and pancreatic insufficiency. The patient presented to the ED with complaints of weakness and diarrhea. She also complains of dizziness and the epigastric pain and decreased oral intake for 3 days. She denies any fever or chills, no dysuria or urgency. She was found leukocytosis but the urinalysis and chest x-ray are unremarkable.  Assessment includes: Chronic diarrhea, leukocytosis, pancreatic insufficiency, and COPD on chronic respiratory failure.    Clinical Impression  Pt presents with minor deficits in strength, transfers, gait, balance, and significant deficits in activity tolerance.  Pt Ind with all bed mobility tasks with good effort.  Pt SBA with transfers with good stability upon initial stand.  Pt able to amb 10' with RW and SBA with good stability but fatigued quickly with SpO2 and HR WNL on 4LO2/min.  Pt will benefit from HHPT services upon discharge to safely address above deficits for decreased caregiver assistance and eventual return to PLOF.      Follow Up Recommendations Home health PT    Equipment Recommendations  None recommended by PT    Recommendations for Other Services       Precautions / Restrictions Precautions Precautions: Fall Restrictions Weight Bearing Restrictions: No      Mobility  Bed Mobility Overal bed mobility: Independent             General bed mobility comments: Good effort without extra time and effort required for all bed mobility tasks  Transfers Overall transfer level: Needs assistance Equipment used: Rolling walker (2 wheeled) Transfers: Sit to/from Stand Sit to Stand: Supervision         General transfer comment: Good effort with no instability upon  initial stand, min verbal cues for proper hand placement  Ambulation/Gait Ambulation/Gait assistance: Supervision Ambulation Distance (Feet): 10 Feet Assistive device: Rolling walker (2 wheeled) Gait Pattern/deviations: Step-through pattern;Decreased step length - right;Decreased step length - left   Gait velocity interpretation: Below normal speed for age/gender General Gait Details: Short B step length with flexed trunk posture but steady without LOB, SpO2 and HR WNL during session on 4LO2/min  Stairs Stairs:  (Deferred)          Wheelchair Mobility    Modified Rankin (Stroke Patients Only)       Balance Overall balance assessment: Needs assistance Sitting-balance support: Feet unsupported;Feet supported;No upper extremity supported Sitting balance-Leahy Scale: Good     Standing balance support: Bilateral upper extremity supported Standing balance-Leahy Scale: Good                               Pertinent Vitals/Pain Pain Assessment: 0-10 Pain Score: 6  Pain Location: R sided chest pain, nursing aware and pt pre-medicated Pain Descriptors / Indicators: Sore Pain Intervention(s): Premedicated before session;Monitored during session    Home Living Family/patient expects to be discharged to:: Private residence Living Arrangements: Children Available Help at Discharge: Family;Available 24 hours/day Type of Home: House Home Access: Stairs to enter Entrance Stairs-Rails: Doctor, general practice of Steps: 3 Home Layout: One level Home Equipment: Walker - 2 wheels      Prior Function Level of Independence: Needs assistance   Gait / Transfers Assistance Needed: Ind Amb HH distances without AD, uses w/c in  community  ADL's / Homemaking Assistance Needed: Daughter assists with homemaking needs        Hand Dominance        Extremity/Trunk Assessment   Upper Extremity Assessment Upper Extremity Assessment: Overall WFL for tasks  assessed    Lower Extremity Assessment Lower Extremity Assessment: Generalized weakness       Communication   Communication: HOH  Cognition Arousal/Alertness: Awake/alert Behavior During Therapy: WFL for tasks assessed/performed Overall Cognitive Status: Within Functional Limits for tasks assessed                                        General Comments      Exercises Total Joint Exercises Ankle Circles/Pumps: AROM;Both;5 reps;10 reps Quad Sets: Strengthening;Both;10 reps Gluteal Sets: Strengthening;Both;10 reps Long Arc Quad: AROM;Both;5 reps;10 reps Knee Flexion: AROM;Both;5 reps;10 reps Marching in Standing: AROM;Both;10 reps Other Exercises Other Exercises: Seated B hip flex x 10 each Other Exercises: HEP education and review for B APs, QS, and GS x 10 each 5-6x/day   Assessment/Plan    PT Assessment Patient needs continued PT services  PT Problem List Decreased strength;Decreased activity tolerance;Decreased balance       PT Treatment Interventions Gait training;Stair training;Functional mobility training;Neuromuscular re-education;Balance training;Therapeutic exercise;Therapeutic activities;Patient/family education    PT Goals (Current goals can be found in the Care Plan section)  Acute Rehab PT Goals Patient Stated Goal: To walk better PT Goal Formulation: With patient Time For Goal Achievement: 03/30/17 Potential to Achieve Goals: Good    Frequency Min 2X/week   Barriers to discharge        Co-evaluation               AM-PAC PT "6 Clicks" Daily Activity  Outcome Measure Difficulty turning over in bed (including adjusting bedclothes, sheets and blankets)?: None Difficulty moving from lying on back to sitting on the side of the bed? : None Difficulty sitting down on and standing up from a chair with arms (e.g., wheelchair, bedside commode, etc,.)?: A Little Help needed moving to and from a bed to chair (including a wheelchair)?: A  Little Help needed walking in hospital room?: A Little Help needed climbing 3-5 steps with a railing? : A Lot 6 Click Score: 19    End of Session Equipment Utilized During Treatment: Gait belt;Oxygen Activity Tolerance: Patient limited by fatigue Patient left: in bed;with bed alarm set;with call bell/phone within reach;with family/visitor present Nurse Communication: Mobility status;Other (comment) (Pt request for breathing treatment) PT Visit Diagnosis: Muscle weakness (generalized) (M62.81);Difficulty in walking, not elsewhere classified (R26.2)    Time: 1610-9604 PT Time Calculation (min) (ACUTE ONLY): 39 min   Charges:   PT Evaluation $PT Eval Low Complexity: 1 Low PT Treatments $Therapeutic Exercise: 8-22 mins   PT G Codes:   PT G-Codes **NOT FOR INPATIENT CLASS** Functional Assessment Tool Used: AM-PAC 6 Clicks Basic Mobility Functional Limitation: Mobility: Walking and moving around Mobility: Walking and Moving Around Current Status (V4098): At least 20 percent but less than 40 percent impaired, limited or restricted Mobility: Walking and Moving Around Goal Status [phone]): At least 1 percent but less than 20 percent impaired, limited or restricted    D. Scott Tamee Battin PT, DPT 03/17/17, 11:22 AM

## 2017-03-17 NOTE — Care Management Note (Signed)
Case Management Note  Patient Details  Name: Brooke [name] MRN: [id number] Date of Birth: [birth year]  Subjective/Objective:   RNCM consult received for hospice services. TC to daughter, Chip Boer. Patient lives with daughter. She uses a walker and bsc.  Daughter states the walker is used  some but often uses the walls to lean on during ambulation. Is on chronic home O2 through [facility]. Discussed hospice referral. Daughter prefers [facility] [address] Referral to [facility] with [facility].              Action/Plan: Referral to [facility]  Expected Discharge Date:  03/18/17               Expected Discharge Plan:  Home w Hospice Care  In-House Referral:     Discharge planning Services  CM Consult  Post Acute Care Choice:  Hospice Choice offered to:  Adult Children  DME Arranged:    DME Agency:     HH Arranged:  Disease Management HH Agency:  [facility]  Status of Service:  In process, will continue to follow  If discussed at Long Length of Stay Meetings, dates discussed:    Additional Comments:  Marily Memos, RN 03/17/2017, 1:43 PM

## 2017-03-17 NOTE — Progress Notes (Signed)
Pt A&OX4. VSS. Pt oriented to unit. Unable to get a stool sample this shift.

## 2017-03-17 NOTE — Care Management Obs Status (Deleted)
MEDICARE OBSERVATION STATUS NOTIFICATION   Patient Details  Name: Brooke [name] MRN: [id number] Date of Birth: [birth year]   Medicare Observation Status Notification Given:  Yes    Marily Memos, RN 03/17/2017, 1:55 PM

## 2017-03-17 NOTE — Progress Notes (Signed)
New referral for [facility] services at home received from [facility]. Patient is a 76 year old woman with a past medical history of end stage COPD, oxygen dependent on 4 liters, pancreatic insufficiency, chronic diarrhea, Mycobacterium avium complex and hypothyroidism admitted to [facility] on 10/9 for evaluation of continued diarrhea, dizziness and epigastric pain. Attending physician Dr. Tressia Miners has recommended hospice services and patient and her daughter have agreed. Writer spoke on the phone with patient's daughter Jocelyn Lamer who will meet with writer tomorrow in her mother's room to further discuss hospice services. CNRN Lattie Haw made aware. Patient information faxed to referral. Thank you. Flo Shanks RN, BSN, [facility], hospital Liaison [phone] c

## 2017-03-17 NOTE — Progress Notes (Signed)
[facility] [place] NAME: Claudie Brickhouse    MR#:  [id number]  DATE OF BIRTH:  09/10/1940  SUBJECTIVE:  CHIEF COMPLAINT:   Chief Complaint  Patient presents with  . Diarrhea   - Appears malnourished, significant weight loss noted recently. Diarrhea worsening. - complains of productive cough and dyspnea  REVIEW OF SYSTEMS:  Review of Systems  Constitutional: Positive for malaise/fatigue and weight loss. Negative for chills and fever.  HENT: Negative for ear discharge, hearing loss, nosebleeds and tinnitus.   Eyes: Negative for blurred vision, double vision and photophobia.  Respiratory: Positive for cough, shortness of breath and wheezing. Negative for hemoptysis.   Cardiovascular: Negative for chest pain, palpitations, orthopnea and leg swelling.  Gastrointestinal: Positive for diarrhea. Negative for abdominal pain, constipation, heartburn, melena, nausea and vomiting.  Genitourinary: Negative for dysuria, frequency and urgency.  Musculoskeletal: Negative for back pain, myalgias and neck pain.  Skin: Negative for rash.  Neurological: Negative for dizziness, tingling, speech change, focal weakness and headaches.  Endo/Heme/Allergies: Does not bruise/bleed easily.  Psychiatric/Behavioral: Negative for depression.    DRUG ALLERGIES:   Allergies  Allergen Reactions  . Sulfa Antibiotics Swelling  . Ambien [Zolpidem Tartrate]     Sleep walking and altered mental status  . Doxycycline     vomiting  . Levofloxacin Other (See Comments)    Unknown rxn.  Tolerates Cipro  . Mometasone Furoate Other (See Comments)     nasonex causes swollen throat, thrush  . Penicillins Other (See Comments)    Unknown reaction noted on hospice paperwork  . Sulfonamide Derivatives Other (See Comments)    Unknown reaction noted on hospice paperwork    VITALS:  Blood pressure (!) 99/51, pulse 77, temperature 97.8 F (36.6 C), temperature source Oral, resp.  rate 18, height _0  (1.626 m), weight 39.6 kg (87 lb 3.2 oz), SpO2 98 %.  PHYSICAL EXAMINATION:  Physical Exam  GENERAL:  76 y.o.-year-old cachectic, ill nourished patient lying in the bed with no acute distress.  EYES: Pupils equal, round, reactive to light and accommodation. No scleral icterus. Extraocular muscles intact.  HEENT: Head atraumatic, normocephalic. Oropharynx and nasopharynx clear.  NECK:  Supple, no jugular venous distention. No thyroid enlargement, no tenderness.  LUNGS: Scant breath sounds bilaterally with scattered wheezing, no rales,rhonchi or crepitation. No use of accessory muscles of respiration.  CARDIOVASCULAR: S1, S2 normal. No murmurs, rubs, or gallops.  ABDOMEN: Soft, nontender, nondistended. Bowel sounds present. No organomegaly or mass.  EXTREMITIES: No pedal edema, cyanosis, or clubbing.  NEUROLOGIC: Cranial nerves II through XII are intact. Muscle strength 5/5 in all extremities. Sensation intact. Gait not checked.  PSYCHIATRIC: The patient is alert and oriented x 3.  SKIN: No obvious rash, lesion, or ulcer.    LABORATORY PANEL:   CBC  Recent Labs Lab 03/17/17 0332  WBC 10.6  HGB 10.8*  HCT 32.9*  PLT 374   ------------------------------------------------------------------------------------------------------------------  Chemistries   Recent Labs Lab 03/16/17 1159 03/17/17 0332  NA 137 138  K 3.7 3.3*  CL 96* 106  CO2 27 27  GLUCOSE 82 86  BUN 13 11  CREATININE 0.88 0.74  CALCIUM 8.9 7.7*  MG 1.7  --   AST 17  --   ALT 9*  --   ALKPHOS 83  --   BILITOT 0.4  --    ------------------------------------------------------------------------------------------------------------------  Cardiac Enzymes  Recent Labs Lab 03/16/17 1202  TROPONINI <0.03   ------------------------------------------------------------------------------------------------------------------  RADIOLOGY:  Dg Chest 2 View  Result Date: 03/16/2017 CLINICAL  DATA:  Diarrhea. EXAM: CHEST  2 VIEW COMPARISON:  12/22/2015.  08/16/2014. FINDINGS: Mediastinum hilar structures are normal. COPD again noted . Chronic interstitial changes noted consistent chronic interstitial lung disease. Stable calcified nodular density left lung base most likely granuloma. Stable bilateral pleural thickening consistent with scarring. Stable cardiomegaly, no pulmonary venous congestion. IMPRESSION: 1. COPD and chronic interstitial lung disease. Chronic pleural-parenchymal scarring. 2. Stable cardiomegaly.  No pulmonary venous congestion. Electronically Signed   By: Marcello Moores  Register   On: 03/16/2017 14:45   Ct Abdomen Pelvis W Contrast  Result Date: 03/16/2017 CLINICAL DATA:  Intermittent diarrhea for the past year. EXAM: CT ABDOMEN AND PELVIS WITH CONTRAST TECHNIQUE: Multidetector CT imaging of the abdomen and pelvis was performed using the standard protocol following bolus administration of intravenous contrast. CONTRAST:  27m ISOVUE-300 IOPAMIDOL (ISOVUE-300) INJECTION 61% COMPARISON:  CT scan 02/28/2017 FINDINGS: Lower chest: Chronic bibasilar scarring changes, tree-in-bud appearance and bronchiectasis. Stable calcified granuloma in the lingula. The heart is normal in size. No pericardial effusion. The distal esophagus is grossly normal. Hepatobiliary: Stable numerous calcified granulomas. Stable intra and extrahepatic biliary dilatation. The gallbladder is unremarkable. No common bile duct stones are identified. Pancreas: Stable severe pancreatic atrophy but no mass, inflammation or significant ductal dilatation. Spleen: Normal size.  Numerous calcified granulomas. Adrenals/Urinary Tract: The adrenal glands and kidneys are unremarkable and stable. Stable benign right renal cyst. Stomach/Bowel: The stomach, duodenum, small bowel and colon are unremarkable. No acute inflammatory changes, mass lesions or obstructive findings. Vascular/Lymphatic: Stable advanced atherosclerotic  calcifications involving the aorta. No aneurysm or dissection. The branch vessels are patent. The major venous structures are patent. No mesenteric or retroperitoneal mass or adenopathy. Reproductive: Surgically absent. Other: No pelvic mass or adenopathy. No free pelvic fluid collections. No inguinal mass or adenopathy. No abdominal wall hernia or subcutaneous lesions. Musculoskeletal: No significant bony findings. IMPRESSION: 1. Chronic basilar scarring changes with bronchiectasis, chronic tree-in-bud appearance hand small airspace nodules suggesting inflammation or atypical infection such as MAC. 2. Chronic intra and extrahepatic biliary dilatation without obvious cause. Possible chronic distal stricture. 3. Stable changes of remote granulomatous disease. 4. No acute abdominal/pelvic findings, mass lesions or lymphadenopathy. Electronically Signed   By: PMarijo SanesM.D.   On: 03/16/2017 14:46    EKG:   Orders placed or performed during the hospital encounter of 03/16/17  . ED EKG  . ED EKG  . EKG 12-Lead  . EKG 12-Lead    ASSESSMENT AND PLAN:   [age]year old female with past medical history significant for chronic respiratory failure on 4 L home oxygen, end-stage COPD, pancreatic insufficiency with chronic diarrhea, arthritis presents to hospital secondary to dizziness abdominal pain is worsening diarrhea.  #1 worsening diarrhea with weakness-stool studies were negative -Likely secondary to her pancreatic insufficiency. -Continue enzyme supplements. Started on Lomotil -Physical therapy consult for weakness  #2 end-stage COPD-with chronic respiratory failure on 4 L home oxygen -Gradual decline noted in the last few months. -If continues to progress, life expectancy will be less than 6 months. -Recommend hospice evaluation for home hospice services -Added Roxanol, continue oxygen support, inhalers -Z-Pak added for productive cough. Continue nebulizers  #3 hypothyroidism-continue  Synthroid  #4 depression-continue home medications. Patient on trazodone and Celexa  #5 hypokalemia-replaced  #6 DVT prophylaxis-Lovenox     All the records are reviewed and case discussed with Care Management/Social Workerr. Management plans discussed with the patient, family and they are in agreement.  CODE STATUS: DO NOT RESUSCITATE   TOTAL TIME TAKING CARE OF THIS PATIENT: 38 minutes.   POSSIBLE D/C IN 1-2 DAYS, DEPENDING ON CLINICAL CONDITION.   Daryan Buell M.D on 03/17/2017 at 1:28 PM  Between 7am to 6pm - Pager - [phone]  After 6pm go to [email or web address] - password [email or web address]  Office  [phone]  CC: Primary care physician; Sandi Mariscal, MD

## 2017-03-17 NOTE — Progress Notes (Signed)
Chaplain received an order to visit with pt in room 149. Chaplain provided information for an advanced directive.    03/17/17 0934  Clinical Encounter Type  Visited With Patient  Visit Type Initial  Referral From Nurse  Consult/Referral To Chaplain  Spiritual Encounters  Spiritual Needs Other (Comment)

## 2017-03-17 NOTE — Progress Notes (Signed)
Initial Nutrition Assessment  DOCUMENTATION CODES:   Severe malnutrition in context of chronic illness  INTERVENTION:  1. Recommended Ensure Enlive po BID, each supplement provides 350 kcal and 20 grams of protein  2. MVI w/ Minerals  NUTRITION DIAGNOSIS:   Malnutrition (Severe) related to chronic illness as evidenced by severe depletion of muscle mass, severe depletion of body fat.  GOAL:   Patient will meet greater than or equal to 90% of their needs  MONITOR:   PO intake, I & O's, Labs, Supplement acceptance, Weight trends  REASON FOR ASSESSMENT:   Consult Assessment of nutrition requirement/status  ASSESSMENT:   Brooke [name] is a 76 yo female with PMH End Stage COPD on 4L nasal cannula at home, chronic  Diarrhea, and pancreatitic insufficiency. Presents with epigastric pain, decreased PO intake x3 days.   Spoke with patient and patient's daughter at bedside. Patient reports eating very little over the past 2 months. Was drinking pediasure and eating bananas, but otherwise had no appetite along with diarrhea. They report her losing weight from 106 pounds to 84 pounds over that time but per chart it appears patient was 90 pounds in August and was 83 pounds upon admission, a 7 pound/7.7% severe weight loss for time frame. Had pancakes, toast, eggs and bacon for breakfast today. PO 100% per patient. Appetite is returning. Open to consuming ensure here in hospital  Nutrition-Focused physical exam completed. Findings are severe fat depletion, severe muscle depletion, and no edema.   Discussed with RN. Patient had concerns with not receiving creon with snacks. Currently Creon is ordered for 2 tablets with meals and 1 tablet with snacks. Informed RN and patient.  Labs reviewed:  K 3.3,  Medications reviewed and include:  Creon NS at 176mL/hr  Diet Order:  Diet regular Room service appropriate? Yes; Fluid consistency: Thin  Skin:  Reviewed, no issues  Last BM:  03/16/2017 (type  6)  Height:   Ht Readings from Last 1 Encounters:  03/16/17  (1.626 m)    Weight:   Wt Readings from Last 1 Encounters:  03/16/17 87 lb 3.2 oz (39.6 kg)    Ideal Body Weight:  54.54 kg  BMI:  Body mass index is 14.97 kg/m.  Estimated Nutritional Needs:   Kcal:  1200-1400 calories  Protein:  59-67 grams (1.5-1.7g/kg)  Fluid:  >1.5L  EDUCATION NEEDS:   Education needs addressed  Dionne Ano. Louana Fontenot, MS, RD LDN Inpatient Clinical Dietitian Pager [phone]

## 2017-03-18 ENCOUNTER — Ambulatory Visit: Admission: RE | Admit: 2017-03-18 | Payer: Medicare PPO | Source: Ambulatory Visit | Admitting: Gastroenterology

## 2017-03-18 ENCOUNTER — Encounter
Admission: EM | Disposition: A | Discharge status: Home / Self Care | Payer: Self-pay | Source: Home / Self Care | Attending: Student in an Organized Health Care Education/Training Program

## 2017-03-18 LAB — BASIC METABOLIC PANEL
Anion gap: 4 — ABNORMAL LOW (ref 5–15)
BUN: 7 mg/dL (ref 6–20)
CALCIUM: 7.9 mg/dL — AB (ref 8.9–10.3)
CO2: 27 mmol/L (ref 22–32)
Chloride: 110 mmol/L (ref 101–111)
Creatinine, Ser: 0.69 mg/dL (ref 0.44–1.00)
GFR calc Af Amer: >60 mL/min (ref >60)
GLUCOSE: 83 mg/dL (ref 65–99)
POTASSIUM: 3.4 mmol/L — AB (ref 3.5–5.1)
Sodium: 141 mmol/L (ref 135–145)

## 2017-03-18 LAB — MAGNESIUM: Magnesium: 1.6 mg/dL — ABNORMAL LOW (ref 1.7–2.4)

## 2017-03-18 SURGERY — COLONOSCOPY WITH PROPOFOL
Anesthesia: General

## 2017-03-18 MED ORDER — OXYCODONE HCL 10 MG PO TABS: 10.0000 mg | ORAL_TABLET | Freq: Three times a day (TID) | ORAL | 0 refills | Status: AC | PRN

## 2017-03-18 MED ORDER — AZITHROMYCIN 250 MG PO TABS: ORAL_TABLET | ORAL | 0 refills | Status: AC

## 2017-03-18 MED ORDER — LORAZEPAM 0.5 MG PO TABS: 0.5000 mg | ORAL_TABLET | Freq: Three times a day (TID) | ORAL | 0 refills | Status: AC | PRN

## 2017-03-18 MED ORDER — DIPHENOXYLATE-ATROPINE 2.5-0.025 MG PO TABS: 1.0000 | ORAL_TABLET | Freq: Four times a day (QID) | ORAL | 0 refills | Status: AC | PRN

## 2017-03-18 MED ORDER — HYDROCOD POLST-CPM POLST ER 10-8 MG/5ML PO SUER: 5.0000 mL | Freq: Two times a day (BID) | ORAL | 0 refills | Status: AC

## 2017-03-18 NOTE — Progress Notes (Signed)
DISCHARGE NOTE:  Pt given discharge instructions and prescriptions. Pt verbalized understanding. Pts daughter brought home O2 tank from home, pt connected to 4L, and wheeled to car by staff.

## 2017-03-18 NOTE — Discharge Summary (Signed)
[facility] [place] NAME: Brooke [name]    MR#:  [id number]  DATE OF BIRTH:  26-Dec-1940  DATE OF ADMISSION:  03/16/2017   ADMITTING PHYSICIAN: Demetrios Loll, MD  DATE OF DISCHARGE:  03/18/17  PRIMARY CARE PHYSICIAN: Sandi Mariscal, MD   ADMISSION DIAGNOSIS:   Dehydration [E86.0] Oliguria [R34] Diarrhea, unspecified type [R19.7]  DISCHARGE DIAGNOSIS:   Active Problems:   Diarrhea   SECONDARY DIAGNOSIS:   Past Medical History:  Diagnosis Date  . Anemia   . Arthritis   . Bronchiectasis    colonized mrsa, s/p vancomyin IV 10 days 5/10 and 7 days 6/10  . Dyspnea   . Headache(784.0)   . History of allergy   . Hypothyroidism   . Mycobacterium avium complex ([place])   . Pneumonia   . Wears dentures    Has partial upper.  does not wear    HOSPITAL COURSE:   76 year old female with past medical history significant for chronic respiratory failure on 4 L home oxygen, end-stage COPD, pancreatic insufficiency with chronic diarrhea, arthritis presents to hospital secondary to dizziness abdominal pain is worsening diarrhea.  #1 Chronic pancreatitis with worsening diarrhea with weakness-stool studies were negative -secondary to her pancreatic insufficiency. -Continue enzyme supplements. Started on Lomotil -Physical therapy consulted for weakness  #2 end-stage COPD-with chronic respiratory failure on 4 L home oxygen -Gradual decline noted in the last few months. -If continues to progress, life expectancy will be less than 6 months. -Appreciate hospice evaluation for home hospice services -She will be discharged with hospice services. She cannot tolerate Roxanol. The LAD pain medications and anxiety medications. - continue oxygen support, inhalers -Z-Pak added for productive cough. Continue nebulizers  #3 hypothyroidism-continue Synthroid  #4 depression-continue home medications. Patient on trazodone and Celexa  #5  hypokalemia-replaced  Will be discharged home today with hospice services  DISCHARGE CONDITIONS:   Guarded  CONSULTS OBTAINED:   None  DRUG ALLERGIES:   Allergies  Allergen Reactions  . Sulfa Antibiotics Swelling  . Ambien [Zolpidem Tartrate]     Sleep walking and altered mental status  . Doxycycline     vomiting  . Levofloxacin Other (See Comments)    Unknown rxn.  Tolerates Cipro  . Mometasone Furoate Other (See Comments)     nasonex causes swollen throat, thrush  . Penicillins Other (See Comments)    Unknown reaction noted on hospice paperwork  . Sulfonamide Derivatives Other (See Comments)    Unknown reaction noted on hospice paperwork   DISCHARGE MEDICATIONS:   Allergies as of 03/18/2017      Reactions   Sulfa Antibiotics Swelling   Ambien [zolpidem Tartrate]    Sleep walking and altered mental status   Doxycycline    vomiting   Levofloxacin Other (See Comments)   Unknown rxn.  Tolerates Cipro   Mometasone Furoate Other (See Comments)    nasonex causes swollen throat, thrush   Penicillins Other (See Comments)   Unknown reaction noted on hospice paperwork   Sulfonamide Derivatives Other (See Comments)   Unknown reaction noted on hospice paperwork      Medication List    STOP taking these medications   BC HEADACHE POWDER PO   FLUTTER Devi   mupirocin ointment 2 % Commonly known as:  BACTROBAN   Na Sulfate-K Sulfate-Mg Sulf 17.5-3.13-1.6 GM/180ML Soln   nystatin-triamcinolone cream Commonly known as:  MYCOLOG II   oxyCODONE-acetaminophen 5-325 MG tablet Commonly known as:  PERCOCET/ROXICET  pantoprazole 40 MG tablet Commonly known as:  PROTONIX   predniSONE 10 MG tablet Commonly known as:  DELTASONE   prochlorperazine 10 MG tablet Commonly known as:  COMPAZINE   sodium chloride HYPERTONIC 3 % nebulizer solution   triamcinolone 0.025 % cream Commonly known as:  KENALOG     TAKE these medications   albuterol (2.5 MG/3ML) 0.083%  nebulizer solution Commonly known as:  PROVENTIL Take 3 mLs (2.5 mg total) by nebulization every 6 (six) hours as needed (respiratory distress).   albuterol 108 (90 Base) MCG/ACT inhaler Commonly known as:  VENTOLIN HFA Inhale 2 puffs into the lungs every 6 (six) hours as needed.   azithromycin 250 MG tablet Commonly known as:  ZITHROMAX X 4 more days   chlorpheniramine-HYDROcodone 10-8 MG/5ML Suer Commonly known as:  TUSSIONEX Take 5 mLs by mouth every 12 (twelve) hours.   citalopram 40 MG tablet Commonly known as:  CELEXA TAKE ONE (1) TABLET BY MOUTH EVERY DAY   diphenoxylate-atropine 2.5-0.025 MG tablet Commonly known as:  LOMOTIL Take 1 tablet by mouth 4 (four) times daily as needed for diarrhea or loose stools.   fluticasone 50 MCG/ACT nasal spray Commonly known as:  FLONASE USE 2 SPRAYS IN EACH NOSTRIL ONCE DAILY   folic acid 1 MG tablet Commonly known as:  FOLVITE Take 1 tablet (1 mg total) by mouth daily.   ipratropium-albuterol 0.5-2.5 (3) MG/3ML Soln Commonly known as:  DUONEB Take 3 mLs by nebulization 3 (three) times daily.   levothyroxine 75 MCG tablet Commonly known as:  SYNTHROID, LEVOTHROID Take 75 mcg by mouth daily before breakfast. What changed:  Another medication with the same name was removed. Continue taking this medication, and follow the directions you see here.   lipase/protease/amylase 36000 UNITS Cpep capsule Commonly known as:  CREON TAKE 2 CAPSULE BY MOUTH WITH MEALS AND 1 CAPSULE BY MOUTH WITH SNACKS   LORazepam 0.5 MG tablet Commonly known as:  ATIVAN Take 1 tablet (0.5 mg total) by mouth every 8 (eight) hours as needed for anxiety.   Oxycodone HCl 10 MG Tabs Commonly known as:  ROXICODONE Take 1 tablet (10 mg total) by mouth every 8 (eight) hours as needed for moderate pain, severe pain or breakthrough pain.   traZODone 100 MG tablet Commonly known as:  DESYREL Take 2 tablets (200 mg total) by mouth at bedtime. What changed:   how much to take   WHEY PROTEIN PO Take by mouth daily.        DISCHARGE INSTRUCTIONS:   1. PCP f/u in 1-2 weeks 2. Hospice services at home  DIET:   Cardiac diet  ACTIVITY:   Activity as tolerated  OXYGEN:   Home Oxygen: Yes.    Oxygen Delivery: 4 liters/min via Patient connected to nasal cannula oxygen  DISCHARGE LOCATION:   home   If you experience worsening of your admission symptoms, develop shortness of breath, life threatening emergency, suicidal or homicidal thoughts you must seek medical attention immediately by calling 911 or calling your MD immediately  if symptoms less severe.  You Must read complete instructions/literature along with all the possible adverse reactions/side effects for all the Medicines you take and that have been prescribed to you. Take any new Medicines after you have completely understood and accpet all the possible adverse reactions/side effects.   Please note  You were cared for by a hospitalist during your hospital stay. If you have any questions about your discharge medications or the care you received  while you were in the hospital after you are discharged, you can call the unit and asked to speak with the hospitalist on call if the hospitalist that took care of you is not available. Once you are discharged, your primary care physician will handle any further medical issues. Please note that NO REFILLS for any discharge medications will be authorized once you are discharged, as it is imperative that you return to your primary care physician (or establish a relationship with a primary care physician if you do not have one) for your aftercare needs so that they can reassess your need for medications and monitor your lab values.    On the day of Discharge:  VITAL SIGNS:   Blood pressure 109/90, pulse 70, temperature 98.7 F (37.1 C), temperature source Axillary, resp. rate 20, height _0  (1.626 m), weight 39.6 kg (87 lb 3.2 oz), SpO2 95  %.  PHYSICAL EXAMINATION:    GENERAL:  76 y.o.-year-old cachectic, ill nourished patient lying in the bed with no acute distress.  EYES: Pupils equal, round, reactive to light and accommodation. No scleral icterus. Extraocular muscles intact.  HEENT: Head atraumatic, normocephalic. Oropharynx and nasopharynx clear.  NECK:  Supple, no jugular venous distention. No thyroid enlargement, no tenderness.  LUNGS: Scant breath sounds bilaterally with scattered wheezing, no rales,rhonchi or crepitation. No use of accessory muscles of respiration.  CARDIOVASCULAR: S1, S2 normal. No murmurs, rubs, or gallops.  ABDOMEN: Soft, nontender, nondistended. Bowel sounds present. No organomegaly or mass.  EXTREMITIES: No pedal edema, cyanosis, or clubbing.  NEUROLOGIC: Cranial nerves II through XII are intact. Muscle strength 5/5 in all extremities. Sensation intact. Gait not checked.  PSYCHIATRIC: The patient is alert and oriented x 3.  SKIN: No obvious rash, lesion, or ulcer.   DATA REVIEW:   CBC  Recent Labs Lab 03/17/17 0332  WBC 10.6  HGB 10.8*  HCT 32.9*  PLT 374    Chemistries   Recent Labs Lab 03/16/17 1159  03/18/17 0450  NA 137  < > 141  K 3.7  < > 3.4*  CL 96*  < > 110  CO2 27  < > 27  GLUCOSE 82  < > 83  BUN 13  < > 7  CREATININE 0.88  < > 0.69  CALCIUM 8.9  < > 7.9*  MG 1.7  --  1.6*  AST 17  --   --   ALT 9*  --   --   ALKPHOS 83  --   --   BILITOT 0.4  --   --   < > = values in this interval not displayed.   Microbiology Results  Results for orders placed or performed during the hospital encounter of 03/16/17  MRSA PCR Screening     Status: None   Collection Time: 03/16/17 11:39 PM  Result Value Ref Range Status   MRSA by PCR NEGATIVE NEGATIVE Final    Comment:        The GeneXpert MRSA Assay (FDA approved for NASAL specimens only), is one component of a comprehensive MRSA colonization surveillance program. It is not intended to diagnose MRSA infection nor  to guide or monitor treatment for MRSA infections.   C difficile quick scan w PCR reflex     Status: None   Collection Time: 03/17/17 10:37 AM  Result Value Ref Range Status   C Diff antigen NEGATIVE NEGATIVE Final   C Diff toxin NEGATIVE NEGATIVE Final   C Diff interpretation No C. difficile  detected.  Final  Gastrointestinal Panel by PCR , Stool     Status: None   Collection Time: 03/17/17 10:38 AM  Result Value Ref Range Status   Campylobacter species NOT DETECTED NOT DETECTED Final   Plesimonas shigelloides NOT DETECTED NOT DETECTED Final   Salmonella species NOT DETECTED NOT DETECTED Final   Yersinia enterocolitica NOT DETECTED NOT DETECTED Final   Vibrio species NOT DETECTED NOT DETECTED Final   Vibrio cholerae NOT DETECTED NOT DETECTED Final   Enteroaggregative E coli (EAEC) NOT DETECTED NOT DETECTED Final   Enteropathogenic E coli (EPEC) NOT DETECTED NOT DETECTED Final   Enterotoxigenic E coli (ETEC) NOT DETECTED NOT DETECTED Final   Shiga like toxin producing E coli (STEC) NOT DETECTED NOT DETECTED Final   Shigella/Enteroinvasive E coli (EIEC) NOT DETECTED NOT DETECTED Final   Cryptosporidium NOT DETECTED NOT DETECTED Final   Cyclospora cayetanensis NOT DETECTED NOT DETECTED Final   Entamoeba histolytica NOT DETECTED NOT DETECTED Final   Giardia lamblia NOT DETECTED NOT DETECTED Final   Adenovirus F40/41 NOT DETECTED NOT DETECTED Final   Astrovirus NOT DETECTED NOT DETECTED Final   Norovirus GI/GII NOT DETECTED NOT DETECTED Final   Rotavirus A NOT DETECTED NOT DETECTED Final   Sapovirus (I, II, IV, and V) NOT DETECTED NOT DETECTED Final    RADIOLOGY:  No results found.   Management plans discussed with the patient, family and they are in agreement.  CODE STATUS:     Code Status Orders        Start     Ordered   03/16/17 2059  Do not attempt resuscitation (DNR)  Continuous    Question Answer Comment  In the event of cardiac or respiratory ARREST Do not call  a "code blue"   In the event of cardiac or respiratory ARREST Do not perform Intubation, CPR, defibrillation or ACLS   In the event of cardiac or respiratory ARREST Use medication by any route, position, wound care, and other measures to relive pain and suffering. May use oxygen, suction and manual treatment of airway obstruction as needed for comfort.      03/16/17 2059    Code Status History    Date Active Date Inactive Code Status Order ID Comments User Context   08/16/2014  9:12 PM 08/18/2014  8:24 PM DNR [id number]  Janece Canterbury, MD Inpatient    Advance Directive Documentation     Most Recent Value  Type of Advance Directive  Living will  Pre-existing out of facility DNR order (yellow form or pink MOST form)  -  "MOST" Form in Place?  -      TOTAL TIME TAKING CARE OF THIS PATIENT: 37  minutes.    Gladstone Lighter M.D on 03/18/2017 at 10:58 AM  Between 7am to 6pm - Pager - [phone]  After 6pm go to [email or web address] - Proofreader  [facility]  Office  [phone]  CC: Primary care physician; Sandi Mariscal, MD   Note: This dictation was prepared with Dragon dictation along with smaller phrase technology. Any transcriptional errors that result from this process are unintentional.

## 2017-03-18 NOTE — Progress Notes (Signed)
Physical Therapy Treatment Patient Details Name: Brooke [name] MRN: [id number] DOB: 08-13-1940 Today's Date: 03/18/2017    History of Present Illness Pt is a 76 y.o.femalewith a known history of chronic respiratory failure on home oxygen 4 L, endstage COPD, chronic diarrhea and pancreatic insufficiency. The patient presented to the ED with complaints of weakness and diarrhea. She also complains of dizziness and the epigastric pain and decreased oral intake for 3 days. She denies any fever or chills, no dysuria or urgency. She was found leukocytosis but the urinalysis and chest x-ray are unremarkable.  Assessment includes: Chronic diarrhea, leukocytosis, pancreatic insufficiency, and COPD on chronic respiratory failure.    PT Comments    Pt presents with mild deficits in strength, transfers, gait, and balance, and significant deficits in activity tolerance.  Pt required SBA with tranfers with good effort and stability upon initial stand.  Pt able to amb 2 x 10' and 1 x 15' with HHA with good stability with SpO2 98% and HR 78 bpm after amb compared to baseline levels of 100% and 67 bpm respectively.  Pt will benefit from HHPT upon discharge to safely address above deficits for decreased caregiver assistance and eventual return to PLOF.     Follow Up Recommendations  Home health PT     Equipment Recommendations  None recommended by PT    Recommendations for Other Services       Precautions / Restrictions Precautions Precautions: Fall Restrictions Weight Bearing Restrictions: No    Mobility  Bed Mobility Overal bed mobility: Independent             General bed mobility comments: Good effort without extra time and effort required for all bed mobility tasks  Transfers Overall transfer level: Needs assistance Equipment used: Rolling walker (2 wheeled) Transfers: Sit to/from Stand Sit to Stand: Supervision         General transfer comment: Good effort with no instability  upon initial stand, min verbal cues for proper hand placement  Ambulation/Gait Ambulation/Gait assistance: Supervision Ambulation Distance (Feet): 15 Feet Assistive device: 1 person hand held assist Gait Pattern/deviations: Step-through pattern;Decreased step length - right;Decreased step length - left   Gait velocity interpretation: Below normal speed for age/gender General Gait Details: Short B step length with flexed trunk posture but steady without LOB, SpO2 and HR WNL during session on 4LO2/min   Stairs            Wheelchair Mobility    Modified Rankin (Stroke Patients Only)       Balance Overall balance assessment: Needs assistance Sitting-balance support: Feet unsupported;Feet supported;No upper extremity supported Sitting balance-Leahy Scale: Good     Standing balance support: No upper extremity supported Standing balance-Leahy Scale: Fair                              Cognition Arousal/Alertness: Awake/alert Behavior During Therapy: WFL for tasks assessed/performed Overall Cognitive Status: Within Functional Limits for tasks assessed                                        Exercises Total Joint Exercises Ankle Circles/Pumps: AROM;Both;5 reps;10 reps Quad Sets: Strengthening;Both;10 reps Gluteal Sets: Strengthening;Both;10 reps Long Arc Quad: AROM;Both;5 reps;10 reps Knee Flexion: AROM;Both;5 reps;10 reps Marching in Standing: AROM;Both;10 reps Other Exercises Other Exercises: HEP education and review for B APs, QS, and  GS x 10 each 5-6x/day    General Comments        Pertinent Vitals/Pain Pain Assessment: 0-10 Pain Score: 7  Pain Location: R sided chest pain, nursing aware and pt pre-medicated Pain Descriptors / Indicators: Sore Pain Intervention(s): Premedicated before session;Monitored during session;Limited activity within patient's tolerance    Home Living                      Prior Function             PT Goals (current goals can now be found in the care plan section) Progress towards PT goals: Progressing toward goals    Frequency    Min 2X/week      PT Plan Current plan remains appropriate    Co-evaluation              AM-PAC PT "6 Clicks" Daily Activity  Outcome Measure  Difficulty turning over in bed (including adjusting bedclothes, sheets and blankets)?: None Difficulty moving from lying on back to sitting on the side of the bed? : None Difficulty sitting down on and standing up from a chair with arms (e.g., wheelchair, bedside commode, etc,.)?: A Little Help needed moving to and from a bed to chair (including a wheelchair)?: A Little Help needed walking in hospital room?: A Little Help needed climbing 3-5 steps with a railing? : A Lot 6 Click Score: 19    End of Session Equipment Utilized During Treatment: Gait belt;Oxygen Activity Tolerance: Patient limited by fatigue Patient left: in chair;with chair alarm set;with call bell/phone within reach Nurse Communication: Mobility status PT Visit Diagnosis: Muscle weakness (generalized) (M62.81);Difficulty in walking, not elsewhere classified (R26.2)     Time: 1191-4782 PT Time Calculation (min) (ACUTE ONLY): 30 min  Charges:  $Gait Training: 8-22 mins $Therapeutic Exercise: 8-22 mins                    G Codes:       D. Elly Modena PT, DPT 03/18/17, 11:09 AM

## 2017-03-18 NOTE — Progress Notes (Signed)
Visit made to new referral for [facility] services at home. Patient seen sitting up in bed, daughter Larene Beach at bedside. Writer initiated education regarding hospice services, philosophy and team approach with good understanding voiced.Patient currently has oxygen in place in the home, she is at baseline of 4 liters. No DME needed prior to discharge home. Plan is for discharge home via car today. Hospice information and contact numbers left with Vickie and Mrs. Granderson. Discharge summary faxed to referral. Thank you. Dayna Barker RN, BSN, [facility] [place], hospital Liaison [phone] c

## 2017-03-18 NOTE — Care Management Note (Signed)
Case Management Note  Patient Details  Name: Brooke [name] MRN: [id number] Date of Birth: 01/26/1941  Subjective/Objective:  Discharging today                  Action/Plan: Patient will discharge home today with [facility]. Karen Robertson has met with daughter this am.     Expected Discharge Date:  03/18/17               Expected Discharge Plan:  Home w Hospice Care  In-House Referral:     Discharge planning Services  CM Consult  Post Acute Care Choice:  Hospice Choice offered to:  Adult Children  DME Arranged:    DME Agency:     HH Arranged:  Disease Management HH Agency:  [facility]  Status of Service:  Completed, signed off  If discussed at Long Length of Stay Meetings, dates discussed:    Additional Comments:  Lisa M Jacobs, RN 03/18/2017, 11:38 AM  

## 2017-03-18 NOTE — Progress Notes (Signed)
Pt is alert and oriented. Chronic 4L of oxygen. Medicated for pain x1 with good results. Up to bedside commode with 1 person assistance.

## 2017-05-17 ENCOUNTER — Other Ambulatory Visit: Payer: Self-pay | Admitting: Gastroenterology

## 2017-05-17 ENCOUNTER — Ambulatory Visit: Payer: Self-pay | Admitting: Family Medicine

## 2017-06-10 ENCOUNTER — Other Ambulatory Visit: Payer: Self-pay | Admitting: Gastroenterology

## 2017-06-10 NOTE — Telephone Encounter (Signed)
Patient has not been seen since 02/2017.  All tests and follow up appts were cancelled.  Do you want to refill 1 and schedule OV?

## 2017-08-06 DEATH — deceased
